# Patient Record
Sex: Male | Born: 1935 | Race: Black or African American | Hispanic: No | Marital: Married | Smoking: Former smoker
Health system: Southern US, Community
[De-identification: ages and names within clinical notes are randomized; demographics above are authoritative.]

## PROBLEM LIST (undated history)

## (undated) DIAGNOSIS — C801 Malignant (primary) neoplasm, unspecified: Secondary | ICD-10-CM

## (undated) DIAGNOSIS — J4489 Other specified chronic obstructive pulmonary disease: Secondary | ICD-10-CM

## (undated) DIAGNOSIS — N400 Enlarged prostate without lower urinary tract symptoms: Secondary | ICD-10-CM

## (undated) DIAGNOSIS — Z8619 Personal history of other infectious and parasitic diseases: Secondary | ICD-10-CM

## (undated) DIAGNOSIS — M1990 Unspecified osteoarthritis, unspecified site: Secondary | ICD-10-CM

## (undated) DIAGNOSIS — H269 Unspecified cataract: Secondary | ICD-10-CM

## (undated) DIAGNOSIS — I10 Essential (primary) hypertension: Secondary | ICD-10-CM

## (undated) DIAGNOSIS — M2550 Pain in unspecified joint: Secondary | ICD-10-CM

## (undated) DIAGNOSIS — J45909 Unspecified asthma, uncomplicated: Secondary | ICD-10-CM

## (undated) DIAGNOSIS — J42 Unspecified chronic bronchitis: Secondary | ICD-10-CM

## (undated) DIAGNOSIS — R0602 Shortness of breath: Secondary | ICD-10-CM

## (undated) DIAGNOSIS — J439 Emphysema, unspecified: Secondary | ICD-10-CM

## (undated) DIAGNOSIS — J449 Chronic obstructive pulmonary disease, unspecified: Secondary | ICD-10-CM

## (undated) DIAGNOSIS — R059 Cough, unspecified: Secondary | ICD-10-CM

## (undated) DIAGNOSIS — J189 Pneumonia, unspecified organism: Secondary | ICD-10-CM

## (undated) DIAGNOSIS — R05 Cough: Secondary | ICD-10-CM

## (undated) HISTORY — PX: NO PAST SURGERIES: SHX2092

## (undated) HISTORY — DX: Other specified chronic obstructive pulmonary disease: J44.89

## (undated) HISTORY — DX: Chronic obstructive pulmonary disease, unspecified: J44.9

---

## 2007-09-06 ENCOUNTER — Inpatient Hospital Stay (HOSPITAL_COMMUNITY): Admission: EM | Admit: 2007-09-06 | Discharge: 2007-09-10 | Payer: Self-pay | Admitting: Emergency Medicine

## 2007-09-16 ENCOUNTER — Ambulatory Visit (HOSPITAL_COMMUNITY): Admission: RE | Admit: 2007-09-16 | Discharge: 2007-09-16 | Payer: Self-pay | Admitting: *Deleted

## 2007-09-24 DIAGNOSIS — J449 Chronic obstructive pulmonary disease, unspecified: Secondary | ICD-10-CM

## 2007-09-24 DIAGNOSIS — I10 Essential (primary) hypertension: Secondary | ICD-10-CM | POA: Insufficient documentation

## 2007-09-25 ENCOUNTER — Ambulatory Visit: Payer: Self-pay | Admitting: Internal Medicine

## 2007-09-25 DIAGNOSIS — Z87891 Personal history of nicotine dependence: Secondary | ICD-10-CM

## 2007-09-30 ENCOUNTER — Ambulatory Visit: Payer: Self-pay | Admitting: Internal Medicine

## 2007-10-01 ENCOUNTER — Telehealth (INDEPENDENT_AMBULATORY_CARE_PROVIDER_SITE_OTHER): Payer: Self-pay | Admitting: *Deleted

## 2007-10-07 ENCOUNTER — Ambulatory Visit: Payer: Self-pay | Admitting: Internal Medicine

## 2007-10-22 ENCOUNTER — Encounter (HOSPITAL_COMMUNITY): Admission: RE | Admit: 2007-10-22 | Discharge: 2007-11-26 | Payer: Self-pay | Admitting: Internal Medicine

## 2007-10-22 ENCOUNTER — Encounter: Payer: Self-pay | Admitting: Internal Medicine

## 2007-11-27 ENCOUNTER — Encounter (HOSPITAL_COMMUNITY): Admission: RE | Admit: 2007-11-27 | Discharge: 2008-02-10 | Payer: Self-pay | Admitting: Internal Medicine

## 2008-01-01 ENCOUNTER — Ambulatory Visit: Payer: Self-pay | Admitting: Internal Medicine

## 2008-01-08 ENCOUNTER — Encounter: Payer: Self-pay | Admitting: Internal Medicine

## 2008-01-29 ENCOUNTER — Encounter: Payer: Self-pay | Admitting: Internal Medicine

## 2008-02-11 ENCOUNTER — Encounter (HOSPITAL_COMMUNITY): Admission: RE | Admit: 2008-02-11 | Discharge: 2008-04-27 | Payer: Self-pay | Admitting: Internal Medicine

## 2008-04-21 ENCOUNTER — Encounter: Payer: Self-pay | Admitting: Internal Medicine

## 2008-04-25 ENCOUNTER — Encounter: Payer: Self-pay | Admitting: Internal Medicine

## 2008-04-30 ENCOUNTER — Telehealth: Payer: Self-pay | Admitting: Internal Medicine

## 2008-04-30 ENCOUNTER — Encounter: Payer: Self-pay | Admitting: Internal Medicine

## 2008-06-15 ENCOUNTER — Ambulatory Visit: Payer: Self-pay | Admitting: Internal Medicine

## 2008-06-15 DIAGNOSIS — R42 Dizziness and giddiness: Secondary | ICD-10-CM

## 2008-06-16 ENCOUNTER — Telehealth (INDEPENDENT_AMBULATORY_CARE_PROVIDER_SITE_OTHER): Payer: Self-pay | Admitting: *Deleted

## 2008-06-16 ENCOUNTER — Ambulatory Visit: Payer: Self-pay | Admitting: Internal Medicine

## 2008-06-16 LAB — CONVERTED CEMR LAB
Albumin: 3.4 g/dL — ABNORMAL LOW (ref 3.5–5.2)
Calcium: 8.6 mg/dL (ref 8.4–10.5)
Creatinine, Ser: 0.9 mg/dL (ref 0.4–1.5)
Glucose, Bld: 84 mg/dL (ref 70–99)

## 2008-06-17 ENCOUNTER — Ambulatory Visit: Payer: Self-pay | Admitting: Cardiology

## 2008-06-24 ENCOUNTER — Ambulatory Visit (HOSPITAL_COMMUNITY): Admission: RE | Admit: 2008-06-24 | Discharge: 2008-06-24 | Payer: Self-pay | Admitting: Internal Medicine

## 2008-07-01 ENCOUNTER — Ambulatory Visit: Payer: Self-pay | Admitting: Internal Medicine

## 2008-07-06 ENCOUNTER — Ambulatory Visit: Payer: Self-pay | Admitting: Pulmonary Disease

## 2008-07-07 ENCOUNTER — Telehealth (INDEPENDENT_AMBULATORY_CARE_PROVIDER_SITE_OTHER): Payer: Self-pay | Admitting: *Deleted

## 2008-09-17 ENCOUNTER — Ambulatory Visit: Payer: Self-pay | Admitting: Cardiology

## 2008-10-05 ENCOUNTER — Telehealth: Payer: Self-pay | Admitting: Internal Medicine

## 2008-10-27 ENCOUNTER — Ambulatory Visit: Payer: Self-pay | Admitting: Internal Medicine

## 2008-11-04 ENCOUNTER — Telehealth (INDEPENDENT_AMBULATORY_CARE_PROVIDER_SITE_OTHER): Payer: Self-pay | Admitting: *Deleted

## 2009-02-23 ENCOUNTER — Ambulatory Visit: Payer: Self-pay | Admitting: Internal Medicine

## 2009-03-16 ENCOUNTER — Ambulatory Visit: Payer: Self-pay | Admitting: Internal Medicine

## 2009-03-16 DIAGNOSIS — R638 Other symptoms and signs concerning food and fluid intake: Secondary | ICD-10-CM

## 2009-03-17 ENCOUNTER — Encounter: Payer: Self-pay | Admitting: Internal Medicine

## 2010-02-14 ENCOUNTER — Ambulatory Visit: Payer: Self-pay | Admitting: Internal Medicine

## 2010-03-28 ENCOUNTER — Ambulatory Visit: Payer: Self-pay | Admitting: Cardiovascular Disease

## 2010-03-29 ENCOUNTER — Telehealth (INDEPENDENT_AMBULATORY_CARE_PROVIDER_SITE_OTHER): Payer: Self-pay | Admitting: *Deleted

## 2010-09-27 NOTE — Progress Notes (Signed)
Summary: 8.1.11 CT results  Phone Note Call from Patient   Caller: Patient Call For: ramaswamy Summary of Call: pt calling for results of ct scan Initial call taken by: Rickard Patience,  March 29, 2010 10:46 AM  Follow-up for Phone Call        called spoke with patient, advised of CT results as stated by MR.  pt verbalized his understanding.  Follow-up by: Boone Master CNA/MA,  March 29, 2010 10:49 AM

## 2010-09-27 NOTE — Assessment & Plan Note (Signed)
Summary: rov/jd   Visit Type:  Follow-up Copy to:  Dr. Quintin Alto [facility] Primary Provider/Referring Provider:  Urgent [facility] at [facility]  CC:  Followup on dyspnea and cough.  Pt states that he feels his breathing is doing better.  He feels that supplemental o2 helps with exertion.  He is using proventil much less.  Pt states that he was seen at UC approx 1 month ago and dxed with "touch of pna"- txed with levaquin 500 mg.  Pt states that still has prod cough with white sputum.Marland Kitchen  History of Present Illness: OV 02/14/2010: Followup Gold Stage 3 COPD with exertional hypoxemia, some weight loss July 2010, chronic stable LUL scar and 4 mm noudle in RUL#23 on CT June 2010. Last visit was in July 2010. COPD was stable at that time. Continues to have stable COPD with minimal exertional dyspnea. Unclear if he is wearning O2. Weight is stable; taking ensure. Only interim issue is that 01/22/2010 had acute worsening cough. WEnt to [facility] urgent care. HAd CXR and was told he had touch of pneumonia (I reviewd cxr and disagree) / Rx with levaquin. Now better. Currently in stable health. No wheeze. No cough. No edema. Feels well.   Preventive Screening-Counseling & Management  Alcohol-Tobacco     Smoking Status: quit     Smoke Cessation Stage: quit     Year Quit: 2009  Current Medications (verified): 1)  Proventil Hfa 108 (90 Base) Mcg/act  Aers (Albuterol Sulfate) .... 2 Puffs As Needed 2)  Ensure High Protein   Liqd (Nutritional Supplements) .... As Directed 3)  Spiriva Handihaler 18 Mcg  Caps (Tiotropium Bromide Monohydrate) .... One Puff in Handihaler Daily 4)  Advair Diskus 100-50 Mcg/dose Misc (Fluticasone-Salmeterol) .Marland Kitchen.. 1 Puff in Am and 1 Puff in Pm  Allergies (verified): No Known Drug Allergies  Past History:  Past Medical History: Reviewed history from 03/16/2009 and no changes required. #C O P D .....................................................Marland KitchenRamaswamyy > - dxed Jan 2009  following exacerbation. Confirmed on CT chest.  -> Gold Stage 3 on pft 09/2007 (post-sprivia, pre-advair) -> exertional desaturation 1 lap in office 09/2007 and 10/2008 - on exertional home o2 >desaturated to 90% afte 185 feet x 3 laps in pffice 03/16/2009  #LUL scar like opacity with associated small nodules - on serial followup..............................Marland KitchenRamaswamy -> newly dxed 06/17/2008, PET negative 06/24/2008 -> stabe on CT 09/17/2008 but has some new small nodules >CT 03/16/2009. LUL scar improved. LUL smaller nodules of Jan 2010 have resolved. Has nee 4mm RUL nodule #Hypertension - dxed 2009 for COPD admission #Tobacco Abuse - quit  Family History: Reviewed history from 09/25/2007 and no changes required. COPD: dad  Coronary Heart Disease: Mom  Social History: Reviewed history from 09/25/2007 and no changes required. Quit smoking 08/29/2007 and then had AE COPD. Retired Financial controller from Forensic scientist.  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 Never worked in Investment banker, corporate, shipyards, or Holiday representative. Lives with wife.   Review of Systems  The patient denies anorexia, fever, weight loss, weight gain, vision loss, decreased hearing, hoarseness, chest pain, syncope, dyspnea on exertion, peripheral edema, prolonged cough, headaches, hemoptysis, abdominal pain, melena, hematochezia, severe indigestion/heartburn, hematuria, incontinence, genital sores, muscle weakness, suspicious skin lesions, transient blindness, difficulty walking, depression, unusual weight change, abnormal bleeding, enlarged lymph nodes, angioedema, breast masses, and testicular masses.    Vital Signs:  Patient profile:   77 year old male Weight:  120 pounds BMI:     18.86 O2 Sat:      100 % on Room air Temp:     97.4 degrees F oral Pulse rate:   65 / minute BP sitting:   102 / 62  (left arm)  Vitals Entered By: Vernie Murders (February 14, 2010 9:05 AM)  O2 Flow:   Room air CC: Followup on dyspnea and cough.  Pt states that he feels his breathing is doing better.  He feels that supplemental o2 helps with exertion.  He is using proventil much less.  Pt states that he was seen at UC approx 1 month ago and dxed with "touch of pna"- txed with levaquin 500 mg.  Pt states that still has prod cough with white sputum.   Physical Exam  General:  thin.   HEENT - no thrush, no post nasal drip No JVD, no lymphadenopathy CVS- s1s2 nml, no murmur RS- clear, no crackles or rhonchi Abd- soft, non-tender, no organomegaly CNS- non focal Ext - no edema  Head:  normocephalic and atraumatic Eyes:  PERRLA/EOM intact; conjunctiva and sclera clear Ears:  Ears with wax both sides Nose:  no deformity, discharge, inflammation, or lesions Mouth:  Dental Caries ++Melampatti Class II.   Neck:  no masses, thyromegaly, or abnormal cervical nodes Chest Wall:  no deformities noted Lungs:  decreased BS bilateral and prolonged exhilation.   Heart:  regular rate and rhythm, S1, S2 without murmurs, rubs, gallops, or clicks Abdomen:  bowel sounds positive; abdomen soft and non-tender without masses, or organomegaly Msk:  no deformity or scoliosis noted with normal posture Pulses:  pulses normal Extremities:  no clubbing, cyanosis, edema, or deformity noted Neurologic:  CN II-XII grossly intact with normal reflexes, coordination, muscle strength and tone Skin:  intact without lesions or rashes Cervical Nodes:  no significant adenopathy Psych:  alert and cooperative; normal mood and affect; normal attention span and concentration   CXR  Procedure date:  01/22/2010  Findings:      copd chronic LUL scar no new infiltrates  Comments:      my personal review. No chnage since 2008 CXR. This is on his CD ROM  Impression & Recommendations:  Problem # 1:  WEIGHT LOSS-ABNORMAL (ICD-783.2) Assessment Unchanged  120# July 2010 120# June 2011  plan continue ensure and protein  diet  Orders: Est. Patient Level III [phone])  Problem # 2:  PULMONARY NODULE, LEFT UPPER LOBE (ICD-518.89) Assessment: Unchanged  LUL Jan 2009 -> July 2010 scar is less LUL nodules Jan 2010 -> July 2001 have resolved RUL 4mm nodule im#23 - NEW   plan repeat ct in agusut 2011 is pending will call him with result  Orders: Est. Patient Level III (60454)  Problem # 3:  C O P D (ICD-496) Assessment: Unchanged  stable  plan continue advair, spiriva cont home o2 at night and with exertion flu shot in fall check VItamin D level today rov 1 year at fu check spirometry and consider A1AT screen (dad had copd)  Other Orders: T- * Misc. Laboratory test [phone])  Patient Instructions: 1)  your copd is stable 2)  I do not think you had pneumonia on 01/22/2010 3)  Keep CD ROom of your cxr safe with you 4)  Meet with our coordinator to talk about insurance issue August Ct 5)  HAVe august CT scan chest and I will call you back with results 6)  Have VITamin D Level Checked today and I wil call  you wiht result 7)  REturn to see me in 1 year or sooner if there are problems 8)  Spirometry at followup in office

## 2011-01-10 NOTE — Discharge Summary (Signed)
NAME:  Frank Garcia, Frank Garcia                ACCOUNT NO.:  [id number]   MEDICAL RECORD NO.:  [id number]          PATIENT TYPE:  INP   LOCATION:  5503                         FACILITY:  [facility]   PHYSICIAN:  Mobolaji B. Bakare, M.D.DATE OF BIRTH:  Jan 25, 1929   DATE OF ADMISSION:  09/05/2007  DATE OF DISCHARGE:  09/10/2007                               DISCHARGE SUMMARY   PRIMARY CARE PHYSICIAN:  Unassigned.   FINAL DIAGNOSES:  1. Chronic obstructive pulmonary disease exacerbation.  2. Tobacco abuse.  3. Hypertension.   PROCEDURES:  Chest x-ray done on September 05, 2007, showed COPD, cannot  exclude occult right lower lobe nodule.  CT scan of the chest without IV  contrast showed no evidence of right lower lobe nodule initially seen on  chest x-ray.  It confirmed COPD and emphysema.   BRIEF HISTORY:  Please refer to the admission H&P for full details.  In  brief, Mr. Frank Garcia is a pleasant 77 year old African-American male who has  no regular doctor.  He does not have any known medical illness.  He is  not on any chronic medications.  He has been smoking cigarettes since  the age of 65.  At his peak, he was smoking 1-1/2 packs per day.  He  quit smoking in January of this year as a New Year's resolution.  The  patient developed upper respiratory tract infection with nasal  congestion, rhinorrhea, postnasal drip about a week prior to  presentation.  He subsequently developed cough, became wheezy and with  shortness of breath on ambulation.  He was taken to the emergency room  for evaluation.  The patient was admitted for COPD exacerbation and  started on steroids, Zithromax and scheduled nebs.   HOSPITAL COURSE:  The patient was placed on albuterol and Atrovent nebs  q.4h.,  prednisone 40 mg daily, Zithromax 200 mg daily for 5 days.  He  became more wheezy on the second day of hospitalization and the steroid  was changed to IV.  The patient made a very slow progress during the  course of the  hospitalization.  Saturation on 2 liters ranged between  95% to 100%.  He is fine at rest, but he becomes wheezy and short of  breath on ambulation.  He tells me that this problem has been going on  for over 2 years, such that he becomes dyspneic and wheezy only to  exertion, but he has not seen a doctor for this.  The patient was  saturating at 86% on room air while resting, and this improved to 96% on  2 L,  He is  comfortable with 3.5 L  to 4 L on ambulation.   Hypertension:  The patient was noted to have high blood pressure during  the course of hospitalization.  This was monitored closely.  The  decision was made to start him on antihypertensive using  hydrochlorothiazide.  Initial blood pressure ranged between 140 to 160  systolic and 70 to 95 diastolic.  He was started on hydrochlorothiazide  25 mg.  Blood pressure at the time of discharge was __________  /  68 .   The patient received a flu vaccine and Pneumovax vaccine during his  hospitalization.   DISCHARGE INSTRUCTIONS:  1. The patient is scheduled to have a pulmonary function test on      Tuesday, September 17, 2007, at 2:00 p.m.  2. He is to follow up with Dr. Marchelle Gearing on September 25, 2007, at 1:30      p.m.  3. Follow up with Dr. Merita Norton, who will assume care as primary      physician.  4. Home health RN has been arranged to follow up with the patient.  5. Check BMET in 10 days with primary care physician.   DISCHARGE MEDICATIONS:  1. DuoNebs 3 mL q.i.d. and p.r.n.  2. Albuterol inhaler 2 puffs p.r.n.  3. Prednisone 40 mg tapered by 10 mg every 4 days.  4. Nasonex nasal spray 1 each nostril daily.  5. Hydrochlorothiazide 25 mg daily.  6. Prilosec OTC 2 tablets daily for 2 weeks.  7. Potassium chloride 10 mEq daily.  8. Nicotine patch 21 mg daily.  9. Ensure 2 to 3 times daily.  10.__________  supplement.  11.Home oxygen 3 to 4 L per minute.  12.Mucinex 600 mg b.i.d.   DISCHARGE LABORATORY DATA:  Sodium 139,  potassium 4.2, chloride  __________ , BUN 15, creatinine 0.9, calcium 9.8, glucose 138 (the  patient is on steroid ).      Mobolaji B. Corky Downs, M.D.  Electronically Signed     MBB/MEDQ  D:  09/11/2007  T:  09/11/2007  Job:  [id number]   cc:   Kalman Shan, MD

## 2011-01-10 NOTE — H&P (Signed)
NAME:  Frank Garcia, Frank Garcia                ACCOUNT NO.:  [id number]   MEDICAL RECORD NO.:  [id number]          PATIENT TYPE:  EMS   LOCATION:  [facility]                         FACILITY:  [facility]   PHYSICIAN:  Mobolaji B. Bakare, M.D.DATE OF BIRTH:  23-Oct-1928   DATE OF ADMISSION:  09/05/2007  DATE OF DISCHARGE:                              HISTORY & PHYSICAL   PRIMARY CARE PHYSICIAN:  Unassigned.   CHIEF COMPLAINT:  Shortness of breath.   HISTORY OF PRESENTING COMPLAINT:  Frank Garcia is a pleasant 77 year old  African American male with no known significant past medical history.  He does not have a regular doctor.  He is not on any chronic  medications.  He was in his usual state of health until about a week  ago, when he developed upper respiratory tract infection with nasal  congestion and rhinorrhea, which later developed into postnasal drip.  The patient subsequently started having shortness of breath associated  with productive cough.  Cough is productive of whitish sputum.  He  became wheezy and developed difficulty with breathing, hence he came to  the emergency room for evaluation last night.  The patient was nebulized  here at the emergency room.  He felt somewhat better.  He was about to  be discharged and it was noted that the patient had dropped his oxygen  saturation on room air to the mid-80s and was extremely short of breath.  Hence, [facility] was called for evaluation and admission.   The patient admitted that he has been having trouble breathing on  minimal exertion for about 2 years.  He can no longer walk a block.  If  he moves from one bedroom to the other at home, he becomes short of  breath, however has been able to cope at home.  He has a significant  history of tobacco abuse, over 60 pack-years of smoking.  The patient  decided to quit smoking on New Years Day.   He denies fever, chills.  He has postnasal drip.  No chest pain or  pleuritic chest pain.   REVIEW OF  SYSTEMS:  There is no abdominal pain, nausea or vomiting,  diarrhea, diaphoresis.  He has no headaches or focal weakness.   PAST MEDICAL HISTORY:  None.   PAST SURGICAL HISTORY:  None.   MEDICATIONS:  None.   ALLERGIES:  No known drug allergies.   FAMILY HISTORY:  Mother passed away from heart failure.  Father passed  away from emphysema.   SOCIAL HISTORY:  The patient  started smoking at the age of 75.  He  smoked a maximum  of 1-1/2 packs.  He started tapering down and  currently quit smoking in December 2008, he was smoking 1-1/2 packs in 3-  4 days.  He does not drink alcohol.  He is a retired Retail buyer.   INITIAL VITALS:  Temperature 97.8, blood pressure 163/95, a repeat blood  pressure is 118/52 with a heart rate of 89, respiratory rate 18-25, O2  saturation 98% on 2 L.  On examination, the patient is mildly dyspneic, not febrile,  anicteric  nor pale.  No elevated JVD, no carotid bruit.  LUNGS:  Very minimal rhonchi at this point.  CVS:  S1, S2 regular.  No gallop or rub.  ABDOMEN:  Not distended, soft, nontender, bowel sounds positive.  EXTREMITIES:  No pedal edema or calf tenderness.  Dorsalis pedis pulses  palpable bilaterally.  CNS:  No focal neurological deficit.   INITIAL LABORATORY DATA:  Sodium 137, potassium 3.7, chloride 102,  bicarb 26, glucose 87, BUN 13, creatinine 0.88, calcium 9.1.  Total  protein 7.2, albumin 3.9, AST 33, ALT 15, alkaline phosphatase 78,  bilirubin 2.0.  White cells 7.6, hemoglobin 13.8, hematocrit 41.4,  platelets 186, neutrophils 80%.  ABG showed a pH of 7.39, pCO2 40, pCO2  94, bicarb 25, O2 saturation 97%.  Chest x-ray shows features consistent  with COPD.  EKG:  Normal sinus rhythm with a heart rate of __________ ,  normal intervals, left axis deviation, no acute ST changes.   ASSESSMENT AND PLAN:  Frank Garcia is a pleasant 77 year old African  American male with no known past medical history.  He has over 60 pack-   years of smoking.  He is presenting with shortness of breath, wheezing  and cough, no fever or leukocytosis.  This was preceded by an upper  respiratory tract infection.  He does have postnasal drip.  He has  received treatment for chronic obstructive pulmonary disease  exacerbation in the emergency room.  The patient's O2 saturation is  poor.  He will be admitted for 24-hour observation.   ADMISSION DIAGNOSES:  1. Chronic obstructive pulmonary disease exacerbation.  2. Tobacco abuse.   PLAN:  The patient will be admitted to a medical-surgical floor.  He  will be nebulized with albuterol and Atrovent q.6h. and q.2h. p.r.n.  Prednisone 40 mg daily to taper.  Zithromax 250 mg daily for 5 days.  Nasonex nasal spray one each nostril daily.  The patient will need an  outpatient pulmonary function test in 3-4 weeks when the lungs are more  settled.  Will ask for tobacco cessation counseling to offer assistance  with quitting smoking.      Mobolaji B. Corky Downs, M.D.  Electronically Signed     MBB/MEDQ  D:  09/05/2007  T:  09/05/2007  Job:  [id number]

## 2011-02-06 ENCOUNTER — Encounter: Payer: Self-pay | Admitting: Internal Medicine

## 2011-02-08 ENCOUNTER — Encounter: Payer: Self-pay | Admitting: Internal Medicine

## 2011-02-08 ENCOUNTER — Ambulatory Visit (INDEPENDENT_AMBULATORY_CARE_PROVIDER_SITE_OTHER): Payer: Medicare Other | Admitting: Internal Medicine

## 2011-02-08 VITALS — BP 116/64 | HR 60 | Temp 97.7°F | Ht 67.0 in | Wt 111.6 lb

## 2011-02-08 DIAGNOSIS — J449 Chronic obstructive pulmonary disease, unspecified: Secondary | ICD-10-CM

## 2011-02-08 DIAGNOSIS — R05 Cough: Secondary | ICD-10-CM

## 2011-02-08 DIAGNOSIS — R918 Other nonspecific abnormal finding of lung field: Secondary | ICD-10-CM

## 2011-02-08 DIAGNOSIS — R911 Solitary pulmonary nodule: Secondary | ICD-10-CM

## 2011-02-08 DIAGNOSIS — R638 Other symptoms and signs concerning food and fluid intake: Secondary | ICD-10-CM

## 2011-02-08 NOTE — Assessment & Plan Note (Signed)
Stable gold stge 3 with age related decline of 110cc in 3.25 years.  Known ex-desat. Sp pulmoonary rehab. Associated cachexia but good funcitional status with class 1-2 dyspnea  PLAN Continue mdi Continue o2 prn and qhs Check alpha 1

## 2011-02-08 NOTE — Assessment & Plan Note (Signed)
Due to sinus drainage and copd. Moderate in severity  Plan Continue copd rx mdi Advised netti pot

## 2011-02-08 NOTE — Patient Instructions (Signed)
#  COPD  -  You have had age related decline in lung function but you are still in the same severe category  -continue your inhalers  - give blood for alpha 1 genetic test (forgot to tell you about it); will call you with results in several weeks  - continue oxygen #weight loss and pulmonary nodules   - check CT cscan chest  - I will call you with results #pulmonary nodules  - repeat CT chest #cough and sinus drainge  - use netti pot, my nurse will show you a picture of it for you to buy at [facility] #Followup  - 9 months or sooner if needed

## 2011-02-08 NOTE — Assessment & Plan Note (Signed)
#  LUL scar like opacity with associated nodules   - new 06/17/2008. PET negative 06/24/2008  - CT 03/16/2009 - improved scar, LUL nodules resolved  #RUL nodule 4mm  - new on CT 03/16/2009  - stabe on CT Aug 2011  PLAN Repeat CT chest due to smoking hx, copd and current weight loss

## 2011-02-08 NOTE — Progress Notes (Signed)
  Subjective:    Patient ID: Frank Garcia, male    DOB: 08-17-1929, 77 y.o.   MRN: [id number]  HPI  FU Gold stage 3 copd (feb 2009, fev1 1.13L/46% while on spiriva and advair with ex-desat, pulmonary nodules, ex-smoker, abnromal weight loss  Wife dementia, stroke.  Now he is caregiver. Daughter moved in with him. Having cough with sinus drainage. Lost 9# wt since last summer; now 111#. No appetite. Otherwise stable health. STable dyspnea. STable moderate cough but is bothering him; admits to chronic sinus drainage. Uses o2 prn. Spirometry today (reviewed) - fev1 0.95L/38% and is a 110cc loss in fev1 in 3 years which approx 35cc/year  Review of Systems  Constitutional: Negative for fever and unexpected weight change.  HENT: Negative for ear pain, nosebleeds, congestion, sore throat, rhinorrhea, sneezing, trouble swallowing, dental problem, postnasal drip and sinus pressure.   Eyes: Negative for redness and itching.  Respiratory: Positive for cough. Negative for chest tightness, shortness of breath and wheezing.   Cardiovascular: Negative for palpitations and leg swelling.  Gastrointestinal: Negative for nausea and vomiting.  Genitourinary: Negative for dysuria.  Musculoskeletal: Negative for joint swelling.  Skin: Negative for rash.  Neurological: Negative for headaches.  Hematological: Does not bruise/bleed easily.  Psychiatric/Behavioral: Negative for dysphoric mood. The patient is not nervous/anxious.        Objective:   Physical Exam  Nursing note and vitals reviewed. Constitutional: He is oriented to person, place, and time. He appears well-developed and well-nourished. No distress.       Lean as before but probably leaner  HENT:  Head: Normocephalic and atraumatic.  Right Ear: External ear normal.  Left Ear: External ear normal.  Mouth/Throat: Oropharynx is clear and moist. No oropharyngeal exudate.  Eyes: Conjunctivae and EOM are normal. Pupils are equal, round, and reactive  to light. Right eye exhibits no discharge. Left eye exhibits no discharge. No scleral icterus.  Neck: Normal range of motion. Neck supple. No JVD present. No tracheal deviation present. No thyromegaly present.  Cardiovascular: Normal rate, regular rhythm and intact distal pulses.  Exam reveals no gallop and no friction rub.   No murmur heard. Pulmonary/Chest: Effort normal and breath sounds normal. No respiratory distress. He has no wheezes. He has no rales. He exhibits no tenderness.  Abdominal: Soft. Bowel sounds are normal. He exhibits no distension and no mass. There is no tenderness. There is no rebound and no guarding.  Musculoskeletal: Normal range of motion. He exhibits no edema and no tenderness.  Lymphadenopathy:    He has no cervical adenopathy.  Neurological: He is alert and oriented to person, place, and time. He has normal reflexes. No cranial nerve deficit. Coordination normal.  Skin: Skin is warm and dry. No rash noted. He is not diaphoretic. No erythema. No pallor.  Psychiatric: He has a normal mood and affect. His behavior is normal. Judgment and thought content normal.          Assessment & Plan:

## 2011-02-08 NOTE — Assessment & Plan Note (Signed)
120# - June 2011 111# - June 2012 - unintentional 9# wt loss  Plan Ct chest esp due to smoking hx, copd and prior pulmonary nodules Woill call him with result If CT chest negative, PMD to address wt loss

## 2011-02-10 ENCOUNTER — Ambulatory Visit (INDEPENDENT_AMBULATORY_CARE_PROVIDER_SITE_OTHER)
Admission: RE | Admit: 2011-02-10 | Discharge: 2011-02-10 | Disposition: A | Discharge status: Home / Self Care | Payer: Medicare Other | Source: Ambulatory Visit | Attending: Internal Medicine | Admitting: Internal Medicine

## 2011-02-10 DIAGNOSIS — R911 Solitary pulmonary nodule: Secondary | ICD-10-CM

## 2011-02-10 DIAGNOSIS — J984 Other disorders of lung: Secondary | ICD-10-CM

## 2011-03-06 ENCOUNTER — Encounter: Payer: Self-pay | Admitting: Internal Medicine

## 2011-05-18 LAB — COMPREHENSIVE METABOLIC PANEL
AST: 30
Albumin: 3.2 — ABNORMAL LOW
BUN: 16
CO2: 28
Calcium: 9
Chloride: 102
Chloride: 106
Creatinine, Ser: 0.88
Creatinine, Ser: 0.89
GFR calc Af Amer: >60
GFR calc Af Amer: >60
GFR calc non Af Amer: >60
GFR calc non Af Amer: >60
Glucose, Bld: 114 — ABNORMAL HIGH
Glucose, Bld: 87
Potassium: 4
Sodium: 137
Sodium: 140
Total Bilirubin: 1.5 — ABNORMAL HIGH
Total Bilirubin: 2 — ABNORMAL HIGH
Total Protein: 6
Total Protein: 7.2

## 2011-05-18 LAB — LIPID PANEL
Cholesterol: 158
Total CHOL/HDL Ratio: 2

## 2011-05-18 LAB — URINE MICROSCOPIC-ADD ON

## 2011-05-18 LAB — CBC
Hemoglobin: 13.8
MCHC: 33.4
MCV: 82.8
Platelets: 186
RDW: 15
WBC: 7.9

## 2011-05-18 LAB — BASIC METABOLIC PANEL
CO2: 33 — ABNORMAL HIGH
Calcium: 9.8
GFR calc Af Amer: >60
GFR calc non Af Amer: >60
Sodium: 139

## 2011-05-18 LAB — DIFFERENTIAL
Basophils Absolute: 0
Eosinophils Relative: 0
Lymphocytes Relative: 13

## 2011-05-18 LAB — URINALYSIS, ROUTINE W REFLEX MICROSCOPIC
Bilirubin Urine: NEGATIVE
Glucose, UA: NEGATIVE
Ketones, ur: 15 — AB
Leukocytes, UA: NEGATIVE
Nitrite: NEGATIVE
Specific Gravity, Urine: 1.024
Urobilinogen, UA: 1
pH: 5.5

## 2011-05-18 LAB — POCT I-STAT 3, ART BLOOD GAS (G3+)
Bicarbonate: 24.8 — ABNORMAL HIGH
Operator id: 135691
pCO2 arterial: 40.4

## 2011-05-25 ENCOUNTER — Other Ambulatory Visit: Payer: Self-pay | Admitting: Internal Medicine

## 2011-05-26 ENCOUNTER — Telehealth: Payer: Self-pay | Admitting: Internal Medicine

## 2011-05-26 MED ORDER — FLUTICASONE-SALMETEROL 100-50 MCG/DOSE IN AEPB: 1.0000 | INHALATION_SPRAY | Freq: Two times a day (BID) | RESPIRATORY_TRACT | Status: DC

## 2011-05-26 MED ORDER — TIOTROPIUM BROMIDE MONOHYDRATE 18 MCG IN CAPS: 18.0000 ug | ORAL_CAPSULE | Freq: Every day | RESPIRATORY_TRACT | Status: DC

## 2011-05-26 NOTE — Telephone Encounter (Signed)
Spoke with pt and he states needs 90 day supply of advair and spiriva sent to [facility]. Pt stated nothing further needed at this time.

## 2011-05-30 ENCOUNTER — Other Ambulatory Visit: Payer: Self-pay | Admitting: *Deleted

## 2011-05-30 MED ORDER — FLUTICASONE-SALMETEROL 100-50 MCG/DOSE IN AEPB: 1.0000 | INHALATION_SPRAY | Freq: Two times a day (BID) | RESPIRATORY_TRACT | Status: DC

## 2011-11-13 ENCOUNTER — Encounter: Payer: Self-pay | Admitting: Internal Medicine

## 2011-11-13 ENCOUNTER — Ambulatory Visit (INDEPENDENT_AMBULATORY_CARE_PROVIDER_SITE_OTHER): Payer: Medicare Other | Admitting: Internal Medicine

## 2011-11-13 DIAGNOSIS — B029 Zoster without complications: Secondary | ICD-10-CM

## 2011-11-13 DIAGNOSIS — J449 Chronic obstructive pulmonary disease, unspecified: Secondary | ICD-10-CM

## 2011-11-13 DIAGNOSIS — J441 Chronic obstructive pulmonary disease with (acute) exacerbation: Secondary | ICD-10-CM | POA: Insufficient documentation

## 2011-11-13 DIAGNOSIS — R638 Other symptoms and signs concerning food and fluid intake: Secondary | ICD-10-CM

## 2011-11-13 MED ORDER — ACYCLOVIR 400 MG PO TABS: 800.0000 mg | ORAL_TABLET | Freq: Every day | ORAL | Status: AC

## 2011-11-13 MED ORDER — DOXYCYCLINE HYCLATE 100 MG PO TABS: 100.0000 mg | ORAL_TABLET | Freq: Two times a day (BID) | ORAL | Status: DC

## 2011-11-13 MED ORDER — PREDNISONE 10 MG PO TABS: ORAL_TABLET | ORAL | Status: DC

## 2011-11-13 NOTE — Patient Instructions (Signed)
#  COPD  - You have mild attack of copd called COPD exacerbation Please take doxycycline 100mg  twice daily after meals x 5 days; avoid sunlight Please take prednisone 40mg  once daily x 3 days, then 20mg  once daily x 3 days, then 10mg  once daily x 3 days, then 5mg  once dailyx 3 days and stop #shingles  - you likely have shingles in the left hip and left buttock area; this explains the pain  - take acyclovir 800mg  five times daily x 7 days (watch out for side effects explained to you by pharmacy)  - because you have had symptoms for a week the pain might not improve or you could have secondary bacterial infection; watch out for this  #Followup - return 7 days with NP Tammy for review and med calendar

## 2011-11-13 NOTE — Assessment & Plan Note (Signed)
improvoing  Plan monitor

## 2011-11-13 NOTE — Assessment & Plan Note (Signed)
Gold stage 3-4. Currently in AECOPD.  Plan See AECOPD section

## 2011-11-13 NOTE — Assessment & Plan Note (Signed)
shingles  - you likely have shingles in the left hip and left buttock area; this explains the pain  - take acyclovir 800mg  five times daily x 7 days (watch out for side effects explained to you by pharmacy)  - because you have had symptoms for a week the pain might not improve or you could have secondary bacterial infection; watch out for this  #Followup - return 7 days with NP Tammy for review and med calendar

## 2011-11-13 NOTE — Assessment & Plan Note (Signed)
You have mild attack of copd called COPD exacerbation -Please take doxycycline 100mg twice daily after meals x 5 days; avoid sunlight -Please take prednisone 40mg once daily x 3 days, then 20mg once daily x 3 days, then 10mg once daily x 3 days, then 5mg once dailyx 3 days and stop   You have mild attack of copd called COPD exacerbation Please take doxycycline 100mg  twice daily after meals x 5 days; avoid sunlight Please take prednisone 40mg  once daily x 3 days, then 20mg  once daily x 3 days, then 10mg  once daily x 3 days, then 5mg  once dailyx 3 days and stop #

## 2011-11-13 NOTE — Progress Notes (Signed)
Subjective:    Patient ID: Frank Garcia, male    DOB: 1929-02-27, 78 y.o.   MRN: [id number]  HPI  #FU Gold stage 3 copd - MM genotype  - feb 2009, fev1 1.13L/46% while on spiriva and advair with ex-desat, - June 2012:  Spirometry today (reviewed) - fev1 0.95L/38% and is a 110cc loss in fev1 in 3 years which approx 35cc/year # pulmonary nodules, - RUL  - June 2012 - stability over  Years. No further fu # ex-smoker,  #abnromal weight loss  - Body mass index is 18.17 kg/(m^2). on 11/13/2011 (weight 116# and up 5# since June 2012)    OV 11/13/2011 Followup COPD, weight loss. : Lab result reviewed: Alpha 1 is MM from June 2012. OVerall stable. Gained 5# weight. Daughter with him today. Says mostly sedentary. Does some work around house (folding clothes, vacuum) and does it well at his own pace without dyspnea. Past 1 month, increased cough, dyspnea, congestion, sputum is thick and tough to get out but when it comes out is dark in color. Feels symptoms are worse in bad weather days like today (40s and cold and rainy). Mild-moderate in intenseity. In additiion new complaint of pain around left hip x 7 days, mild, constant, dull, no radiation, no aggravating or relieving factors. Exams shows zoster rash - noted only today  Past, Family, Social reviewed: no change since last visit. Wife still alive and he is caretaker         Review of Systems  Constitutional: Negative for fever and unexpected weight change.  HENT: Negative for ear pain, nosebleeds, congestion, sore throat, rhinorrhea, sneezing, trouble swallowing, dental problem, postnasal drip and sinus pressure.   Eyes: Negative for redness and itching.  Respiratory: Negative for cough, chest tightness, shortness of breath and wheezing.   Cardiovascular: Negative for palpitations and leg swelling.  Gastrointestinal: Negative for nausea and vomiting.  Genitourinary: Negative for dysuria.  Musculoskeletal: Negative for joint swelling.    Skin: Negative for rash.  Neurological: Negative for headaches.  Hematological: Does not bruise/bleed easily.  Psychiatric/Behavioral: Negative for dysphoric mood. The patient is not nervous/anxious.        Objective:   Physical Exam  Nursing note and vitals reviewed. Constitutional: He is oriented to person, place, and time. He appears well-developed and well-nourished. No distress.       Body mass index is 18.17 kg/(m^2).   HENT:  Head: Normocephalic and atraumatic.  Right Ear: External ear normal.  Left Ear: External ear normal.  Mouth/Throat: Oropharynx is clear and moist. No oropharyngeal exudate.  Eyes: Conjunctivae and EOM are normal. Pupils are equal, round, and reactive to light. Right eye exhibits no discharge. Left eye exhibits no discharge. No scleral icterus.  Neck: Normal range of motion. Neck supple. No JVD present. No tracheal deviation present. No thyromegaly present.  Cardiovascular: Normal rate, regular rhythm and intact distal pulses.  Exam reveals no gallop and no friction rub.   No murmur heard. Pulmonary/Chest: Effort normal and breath sounds normal. No respiratory distress. He has no wheezes. He has no rales. He exhibits no tenderness.  Abdominal: Soft. Bowel sounds are normal. He exhibits no distension and no mass. There is no tenderness. There is no rebound and no guarding.  Musculoskeletal: Normal range of motion. He exhibits no edema and no tenderness.  Lymphadenopathy:    He has no cervical adenopathy.  Neurological: He is alert and oriented to person, place, and time. He has normal reflexes. No cranial nerve  deficit. Coordination normal.  Skin: Skin is warm and dry. Rash noted. He is not diaphoretic. No erythema. No pallor.       Zoster like rash in left gluteal area and left hip in dermatoome patter. Dtr took picture with iPHONE. Spotted by patient only today  Psychiatric: He has a normal mood and affect. His behavior is normal. Judgment and thought  content normal.   Nursing note and vitals reviewed. Constitutional: He is oriented to person, place, and time. He appears well-developed and well-nourished. No distress.       Lean as before but probably leaner  HENT:  Head: Normocephalic and atraumatic.  Right Ear: External ear normal.  Left Ear: External ear normal.  Mouth/Throat: Oropharynx is clear and moist. No oropharyngeal exudate.  Eyes: Conjunctivae and EOM are normal. Pupils are equal, round, and reactive to light. Right eye exhibits no discharge. Left eye exhibits no discharge. No scleral icterus.  Neck: Normal range of motion. Neck supple. No JVD present. No tracheal deviation present. No thyromegaly present.  Cardiovascular: Normal rate, regular rhythm and intact distal pulses.  Exam reveals no gallop and no friction rub.   No murmur heard. Pulmonary/Chest: Effort normal and breath sounds normal. No respiratory distress. He has no wheezes. He has no rales. He exhibits no tenderness.  Abdominal: Soft. Bowel sounds are normal. He exhibits no distension and no mass. There is no tenderness. There is no rebound and no guarding.  Musculoskeletal: Normal range of motion. He exhibits no edema and no tenderness.  Lymphadenopathy:    He has no cervical adenopathy.  Neurological: He is alert and oriented to person, place, and time. He has normal reflexes. No cranial nerve deficit. Coordination normal.  Skin: Skin is warm and dry. No rash noted. He is not diaphoretic. No erythema. No pallor.  Psychiatric: He has a normal mood and affect. His behavior is normal. Judgment and thought content normal.         Assessment & Plan:

## 2011-11-20 ENCOUNTER — Ambulatory Visit (INDEPENDENT_AMBULATORY_CARE_PROVIDER_SITE_OTHER): Payer: Medicare Other | Admitting: Adult Health

## 2011-11-20 ENCOUNTER — Encounter: Payer: Self-pay | Admitting: Adult Health

## 2011-11-20 VITALS — BP 132/72 | HR 68 | Temp 96.6°F | Ht 67.5 in | Wt 116.4 lb

## 2011-11-20 DIAGNOSIS — J441 Chronic obstructive pulmonary disease with (acute) exacerbation: Secondary | ICD-10-CM

## 2011-11-20 DIAGNOSIS — B029 Zoster without complications: Secondary | ICD-10-CM

## 2011-11-20 MED ORDER — TRAMADOL HCL 50 MG PO TABS: 50.0000 mg | ORAL_TABLET | Freq: Three times a day (TID) | ORAL | Status: DC | PRN

## 2011-11-20 NOTE — Assessment & Plan Note (Signed)
Recent outbreak- resolving  Does have some residual pain , will have him finish acyclovir and prednisone  May use tylenol and tramadol for pain control  If not resolved w/ residual PHN - will need follow up with PCP to consider Lyrica.

## 2011-11-20 NOTE — Patient Instructions (Signed)
Finish Acyclovir and prednisone as directed.  May get Capsacin cream -apply to left hip/back area As needed  For pain.  May use Tylenol As needed  Pain .  May use Tramadol 50mg  every 8 hrs as needed for pain. -may make you sleepy.  Continue on Advair and Spiriva .  follow up Dr. Marchelle Gearing in 3 months and As needed   If pain from shingles does not resolve will need to see your family doctor.

## 2011-11-20 NOTE — Progress Notes (Signed)
Subjective:    Patient ID: Frank Garcia, male    DOB: Jul 10, 1929, 78 y.o.   MRN: [id number]  HPI  #FU Gold stage 3 copd - MM genotype  - feb 2009, fev1 1.13L/46% while on spiriva and advair with ex-desat, - June 2012:  Spirometry today (reviewed) - fev1 0.95L/38% and is a 110cc loss in fev1 in 3 years which approx 35cc/year # pulmonary nodules, - RUL  - June 2012 - stability over  Years. No further fu # ex-smoker,  #abnromal weight loss  - Body mass index is 18.17 kg/(m^2). on 11/13/2011 (weight 116# and up 5# since June 2012)    OV 11/13/2011 Followup COPD, weight loss. : Lab result reviewed: Alpha 1 is MM from June 2012. OVerall stable. Gained 5# weight. Daughter with him today. Says mostly sedentary. Does some work around house (folding clothes, vacuum) and does it well at his own pace without dyspnea. Past 1 month, increased cough, dyspnea, congestion, sputum is thick and tough to get out but when it comes out is dark in color. Feels symptoms are worse in bad weather days like today (40s and cold and rainy). Mild-moderate in intenseity. In additiion new complaint of pain around left hip x 7 days, mild, constant, dull, no radiation, no aggravating or relieving factors. Exams shows zoster rash - noted only today    11/20/2011 follow up  Pt returns for follow up . Patient was seen in the office. Last week for a COPD exacerbation. Also, noted to have acute herpes zoster flare. He was treated with doxycycline and prednisone taper and Acyclovir . He returns today with decreased cough and congestion. Says that his rash is drying out. However , still has pain, along the hip area. Reviewed all his medication list.  and they appear to be correct. Unfortunately, he did not bring his medications in today. He denies any chest pain, hemoptysis, abdominal pain, nausea, vomiting, or leg swelling.         Review of Systems  Constitutional:   No  weight loss, night sweats,  Fevers, chills, fatigue,  or  lassitude.  HEENT:   No headaches,  Difficulty swallowing,  Tooth/dental problems, or  Sore throat,                No sneezing, itching, ear ache, nasal congestion, post nasal drip,   CV:  No chest pain,  Orthopnea, PND, swelling in lower extremities, anasarca, dizziness, palpitations, syncope.   GI  No heartburn, indigestion, abdominal pain, nausea, vomiting, diarrhea, change in bowel habits, loss of appetite, bloody stools.   Resp:   No excess mucus, no productive cough,  No non-productive cough,  No coughing up of blood.  No change in color of mucus.  No wheezing.  No chest wall deformity  Skin: no rash or lesions.  GU: no dysuria, change in color of urine, no urgency or frequency.  No flank pain, no hematuria   MS:  No joint pain or swelling.  No decreased range of motion.  No back pain.  Psych:  No change in mood or affect. No depression or anxiety.  No memory loss.         Objective:   GEN: A/Ox3; pleasant , NAD, thin frail   HEENT:  [place]/AT,  EACs-clear, TMs-wnl, NOSE-clear, THROAT-clear, no lesions, no postnasal drip or exudate noted.   NECK:  Supple w/ fair ROM; no JVD; normal carotid impulses w/o bruits; no thyromegaly or nodules palpated; no lymphadenopathy.  RESP  Coarse BS , diminished bS in bases no accessory muscle use, no dullness to percussion  CARD:  RRR, no m/r/g  , no peripheral edema, pulses intact, no cyanosis or clubbing.  GI:   Soft & nt; nml bowel sounds; no organomegaly or masses detected.  Musco: Warm bil, no deformities or joint swelling noted.   Neuro: alert, no focal deficits noted.    Skin: Warm, along left hip area dried crusted rash.          Assessment & Plan:

## 2011-11-20 NOTE — Assessment & Plan Note (Signed)
Resolving flare  Finish prednisone taper  Cont on Advair and Spiriva

## 2011-11-27 ENCOUNTER — Other Ambulatory Visit: Payer: Self-pay

## 2011-11-27 ENCOUNTER — Encounter (HOSPITAL_COMMUNITY): Payer: Self-pay | Admitting: Emergency Medicine

## 2011-11-27 ENCOUNTER — Inpatient Hospital Stay (HOSPITAL_COMMUNITY)
Admission: EM | Admit: 2011-11-27 | Discharge: 2011-11-28 | DRG: 312 | Disposition: A | Discharge status: Home / Self Care | Payer: Medicare Other | Source: Ambulatory Visit | Attending: Internal Medicine | Admitting: Internal Medicine

## 2011-11-27 ENCOUNTER — Emergency Department (HOSPITAL_COMMUNITY): Payer: Medicare Other

## 2011-11-27 DIAGNOSIS — R911 Solitary pulmonary nodule: Secondary | ICD-10-CM | POA: Diagnosis present

## 2011-11-27 DIAGNOSIS — B029 Zoster without complications: Secondary | ICD-10-CM | POA: Diagnosis present

## 2011-11-27 DIAGNOSIS — R55 Syncope and collapse: Secondary | ICD-10-CM | POA: Diagnosis present

## 2011-11-27 DIAGNOSIS — N400 Enlarged prostate without lower urinary tract symptoms: Secondary | ICD-10-CM | POA: Diagnosis present

## 2011-11-27 DIAGNOSIS — B0229 Other postherpetic nervous system involvement: Secondary | ICD-10-CM | POA: Diagnosis present

## 2011-11-27 DIAGNOSIS — R350 Frequency of micturition: Secondary | ICD-10-CM | POA: Diagnosis present

## 2011-11-27 DIAGNOSIS — I2510 Atherosclerotic heart disease of native coronary artery without angina pectoris: Secondary | ICD-10-CM | POA: Diagnosis present

## 2011-11-27 DIAGNOSIS — J449 Chronic obstructive pulmonary disease, unspecified: Secondary | ICD-10-CM | POA: Diagnosis present

## 2011-11-27 DIAGNOSIS — I951 Orthostatic hypotension: Principal | ICD-10-CM | POA: Diagnosis present

## 2011-11-27 DIAGNOSIS — R3911 Hesitancy of micturition: Secondary | ICD-10-CM | POA: Diagnosis present

## 2011-11-27 DIAGNOSIS — I10 Essential (primary) hypertension: Secondary | ICD-10-CM | POA: Diagnosis present

## 2011-11-27 DIAGNOSIS — Z79899 Other long term (current) drug therapy: Secondary | ICD-10-CM

## 2011-11-27 DIAGNOSIS — J4489 Other specified chronic obstructive pulmonary disease: Secondary | ICD-10-CM | POA: Diagnosis present

## 2011-11-27 DIAGNOSIS — F17200 Nicotine dependence, unspecified, uncomplicated: Secondary | ICD-10-CM | POA: Diagnosis present

## 2011-11-27 HISTORY — DX: Benign prostatic hyperplasia without lower urinary tract symptoms: N40.0

## 2011-11-27 LAB — BASIC METABOLIC PANEL
CO2: 26 mEq/L (ref 19–32)
Glucose, Bld: 92 mg/dL (ref 70–99)
Potassium: 4 mEq/L (ref 3.5–5.1)
Sodium: 137 mEq/L (ref 135–145)

## 2011-11-27 LAB — CARDIAC PANEL(CRET KIN+CKTOT+MB+TROPI)
CK, MB: 2.5 ng/mL (ref 0.3–4.0)
CK, MB: 3 ng/mL (ref 0.3–4.0)
Relative Index: INVALID (ref 0.0–2.5)
Troponin I: <0.3 ng/mL (ref <0.30)
Troponin I: <0.3 ng/mL (ref <0.30)
Troponin I: <0.3 ng/mL (ref <0.30)

## 2011-11-27 LAB — CBC
Platelets: 161 10*3/uL (ref 150–400)
RBC: 4.57 MIL/uL (ref 4.22–5.81)
WBC: 7 10*3/uL (ref 4.0–10.5)

## 2011-11-27 LAB — DIFFERENTIAL
Lymphocytes Relative: 22 % (ref 12–46)
Lymphs Abs: 1.6 10*3/uL (ref 0.7–4.0)
Neutro Abs: 4.7 10*3/uL (ref 1.7–7.7)
Neutrophils Relative %: 68 % (ref 43–77)

## 2011-11-27 MED ORDER — ENOXAPARIN SODIUM 40 MG/0.4ML ~~LOC~~ SOLN
40.0000 mg | SUBCUTANEOUS | Status: DC
  Administered 2011-11-27: 40 mg via SUBCUTANEOUS
  Filled 2011-11-27 (×2): qty 0.4

## 2011-11-27 MED ORDER — TIOTROPIUM BROMIDE MONOHYDRATE 18 MCG IN CAPS
18.0000 ug | ORAL_CAPSULE | Freq: Every day | RESPIRATORY_TRACT | Status: DC
  Administered 2011-11-28: 18 ug via RESPIRATORY_TRACT
  Filled 2011-11-27: qty 5

## 2011-11-27 MED ORDER — SODIUM CHLORIDE 0.9 % IV BOLUS (SEPSIS)
500.0000 mL | Freq: Once | INTRAVENOUS | Status: AC
  Administered 2011-11-27: 500 mL via INTRAVENOUS

## 2011-11-27 MED ORDER — SODIUM CHLORIDE 0.9 % IV SOLN
INTRAVENOUS | Status: AC
  Administered 2011-11-27: 13:00:00 via INTRAVENOUS

## 2011-11-27 MED ORDER — BIOTENE DRY MOUTH MT LIQD: 15.0000 mL | Freq: Two times a day (BID) | OROMUCOSAL | Status: DC

## 2011-11-27 MED ORDER — ALBUTEROL SULFATE HFA 108 (90 BASE) MCG/ACT IN AERS
2.0000 | INHALATION_SPRAY | Freq: Four times a day (QID) | RESPIRATORY_TRACT | Status: DC | PRN
  Filled 2011-11-27: qty 6.7

## 2011-11-27 MED ORDER — FLUTICASONE-SALMETEROL 100-50 MCG/DOSE IN AEPB
1.0000 | INHALATION_SPRAY | Freq: Two times a day (BID) | RESPIRATORY_TRACT | Status: DC
  Administered 2011-11-27 – 2011-11-28 (×2): 1 via RESPIRATORY_TRACT
  Filled 2011-11-27: qty 14

## 2011-11-27 MED ORDER — TRAMADOL HCL 50 MG PO TABS
50.0000 mg | ORAL_TABLET | Freq: Three times a day (TID) | ORAL | Status: DC | PRN
  Administered 2011-11-27 – 2011-11-28 (×2): 50 mg via ORAL
  Filled 2011-11-27 (×2): qty 1

## 2011-11-27 MED ORDER — SODIUM CHLORIDE 0.9 % IJ SOLN
3.0000 mL | Freq: Two times a day (BID) | INTRAMUSCULAR | Status: DC
  Administered 2011-11-27: 3 mL via INTRAVENOUS

## 2011-11-27 MED ORDER — BIOTENE DRY MOUTH MT LIQD: 15.0000 mL | OROMUCOSAL | Status: DC | PRN

## 2011-11-27 NOTE — Progress Notes (Signed)
  Echocardiogram 2D Echocardiogram has been performed.  Brynlei Klausner L 11/27/2011, 3:56 PM

## 2011-11-27 NOTE — H&P (Signed)
Hospital Admission Note Date: 11/27/2011  Patient name:  Frank Garcia  Medical record number:  [id number] Date of birth:  [birth year]  Age: 78 y.o. Gender: male PCP:    No primary provider on file.  Medical Service:   Internal Medicine Teaching Service   Attending physician:  Dr. Aundria Rud First Contact:   Dr. Manson Passey    Pager: [phone]  Second Contact:   Dr. Tonny Branch   Pager: [phone] After Hours:    First Contact   Pager: [phone]      Second Contact  Pager: [phone]   Chief Complaint: Syncope  History of Present Illness: Patient is a 78 y.o. male with a PMHx of COPD, who presents to [facility] for evaluation of dizziness and passing out morning of admission. Patient lives with his daughter who is present at bedside. Patient reports he was fixing himself breakfast consisting of toast and bacon, when he suddenly felt dizzy. His daughter who was in the kitchen noticed he was about to fall and thus grabbed a chair and placed it beneath him. According to his daughter he was out for approximately 30-45 seconds. The daughter proceeded to call EMS. No tongue biting or limb jerking was reported and patient has no known seizure history. He denies weakness, numbness in any of his extremities. At baseline he is independent in all ALDs and IADLs. He reports feeling somewhat short of breath just prior to episode and went to get his oxygen, but unable to do so as he fell into the chair. The patient reports he is only on home oxygen at night and that during the day he does not need it. He was recently evaluated for BPH and started on flomax on Saturday  Night. He received two doses. Additionally patient has been suffering from post-herpetic neuralgia and has been taking tramadol for pain relief. He had very little solid and liquid intake over the weekend due to pain from shingles. He denies chest pain, cough, headache, sudden weight loss, numbness, weakness, nausea, vomiting, and changes to bowel and urinary habits, other than  frequency due to BPH.  Current Outpatient Medications: Current Facility-Administered Medications  Medication Dose Route Frequency Provider Last Rate Last Dose  . 0.9 %  sodium chloride infusion   Intravenous Continuous Gaynor Genco, MD 100 mL/hr at 11/27/11 1305    . albuterol (PROVENTIL HFA;VENTOLIN HFA) 108 (90 BASE) MCG/ACT inhaler 2 puff  2 puff Inhalation Q6H PRN Kya Mayfield, MD      . enoxaparin (LOVENOX) injection 40 mg  40 mg Subcutaneous Q24H Clevester Helzer, MD   40 mg at 11/27/11 1304  . Fluticasone-Salmeterol (ADVAIR) 100-50 MCG/DOSE inhaler 1 puff  1 puff Inhalation BID Izyan Ezzell, MD      . sodium chloride 0.9 % bolus 500 mL  500 mL Intravenous Once Nat Christen, MD   500 mL at 11/27/11 0832  . sodium chloride 0.9 % injection 3 mL  3 mL Intravenous Q12H Verina Galeno, MD   3 mL at 11/27/11 1305  . tiotropium (SPIRIVA) inhalation capsule 18 mcg  18 mcg Inhalation Daily Keanen Dohse, MD      . traMADol (ULTRAM) tablet 50 mg  50 mg Oral Q8H PRN Melida Quitter, MD   50 mg at 11/27/11 1058    Allergies: No Known Allergies   Past Medical History: Past Medical History  Diagnosis Date  . Chronic airway obstruction, not elsewhere classified     on home O2 at night  . BPH (benign prostatic hypertrophy)   .  Tobacco abuse   . Weight loss     Past Surgical History: Past Surgical History  Procedure Date  . No past surgeries     Family History: Family History  Problem Relation Age of Onset  . COPD Father     Social History: History   Social History  . Marital Status: Married    Spouse Name: N/A    Number of Children: N/A  . Years of Education: 12   Occupational History  . retired     Research officer, political party   Social History Main Topics  . Smoking status: Former Smoker -- 1.5 packs/day for 40 years    Types: Cigarettes    Quit date: 08/29/2007  . Smokeless tobacco: Not on file  . Alcohol Use: No  . Drug Use: No  . Sexually Active: No   Other Topics Concern    . Not on file   Social History Narrative   Independent in ADLs and IADLsWalks without assistance    Review of Systems: Constitutional:  Denies fever, chills, diaphoresis, appetite change and fatigue.  HEENT: Denies photophobia, eye pain, redness, hearing loss, ear pain, congestion, sore throat, rhinorrhea, sneezing, mouth sores, trouble swallowing, neck pain, neck stiffness and tinnitus.  Respiratory: Denies SOB, DOE, cough, chest tightness, and wheezing.  Cardiovascular: Denies chest pain, palpitations and leg swelling.  Gastrointestinal: Denies nausea, vomiting, abdominal pain, diarrhea, constipation, blood in stool and abdominal distention.  Genitourinary: Denies dysuria, urgency, frequency, hematuria, flank pain and difficulty urinating.  Musculoskeletal: Denies myalgias, back pain, joint swelling, arthralgias and gait problem.   Skin: Denies pallor, rash and wound.  Neurological: Denies dizziness, seizures, syncope, weakness, light-headedness, numbness and headaches.   Hematological: Denies adenopathy. Easy bruising, personal or family bleeding history.  Psychiatric/ Behavioral: Denies suicidal ideation, mood changes, confusion, nervousness, sleep disturbance and agitation.    Vital Signs: Wt Readings from Last 3 Encounters:  11/27/11 116 lb (52.617 kg)  11/20/11 116 lb 6.4 oz (52.799 kg)  11/13/11 116 lb (52.617 kg)   Temp Readings from Last 3 Encounters:  11/27/11 97.8 F (36.6 C)   11/20/11 96.6 F (35.9 C) Oral  11/13/11 98 F (36.7 C) Oral   BP Readings from Last 3 Encounters:  11/27/11 122/72  11/20/11 132/72  11/13/11 110/70   Pulse Readings from Last 3 Encounters:  11/27/11 64  11/20/11 68  11/13/11 86    Physical Exam: General: Vital signs reviewed and noted. Thin appearing man, looks stated age, in no acute distress; alert, appropriate and cooperative throughout examination.  Head: Normocephalic, atraumatic.  Eyes: PERRL, EOMI, No signs of anemia or  jaundince.  Nose: Mucous membranes slightly dry  Neck: No deformities, masses, or tenderness noted.Supple, No carotid Bruits  Lungs:  Normal respiratory effort. Clear to auscultation BL without crackles or wheezes.  Heart: RRR. S1 and S2 normal without gallop, murmur, or rubs.  Abdomen:  BS normoactive. Soft, Nondistended, non-tender.  No masses or organomegaly.  Extremities: No pretibial edema.  Neurologic: A&O X3, CN II - XII are grossly intact. Motor strength is 5/5 in the all 4 extremities, Sensations intact to light touch, Cerebellar signs negative.  Skin: No visible rashes, scars.   Lab results: Basic Metabolic Panel: Recent Labs  [facility] 11/27/11 0828   NA 137   K 4.0   CL 103   CO2 26   GLUCOSE 92   BUN 18   CREATININE 1.07   CALCIUM 8.8   MG --   PHOS --   CBC:  Recent Labs  [facility] 11/27/11 0828   WBC 7.0   NEUTROABS 4.7   HGB 12.7*   HCT 38.1*   MCV 83.4   PLT 161   Cardiac Enzymes: Recent Labs  [facility] 11/27/11 1150 11/27/11 0828   CKTOTAL 62 56   CKMB 2.8 2.5   CKMBINDEX -- --   TROPONINI <0.30 <0.30    Imaging results:  Dg Chest 2 View  11/27/2011  *RADIOLOGY REPORT*  Clinical Data: COPD/emphysema, shortness of breath, chest pain  CHEST - 2 VIEW  Comparison: 11/04/2008, 02/10/2011  Findings: Hyperinflation noted compatible with background emphysema, more apparent on the prior chest CTs.  No superimposed edema, pneumonia, collapse, consolidation, effusion or pneumothorax.  Trachea midline.  Normal heart size and vascularity.  Right upper lobe 17 x 8 mm nodular density present on today's study, more apparent than the prior chest x-rays.  Recommend follow- up with a repeat non emergent chest CT without contrast.  IMPRESSION: COPD/emphysema  No acute CHF or pneumonia  Developing right upper lobe 17 x 8 mm nodular density versus nodule by chest x-ray.  See above comment.  Original Report Authenticated By: Judie Petit. Ruel Favors, M.D.    Assessment & Plan:  This  is a 78 year old independently functioning gentleman with history of COPD on home oxygen and BPH who has been admitted for syncope.  1.) Syncope - given history of recently started flomax in conjunction with poor intake over the weekend and pain medication, this episode is likely a presentation of orthostatic hypotension secondary to flomax. Clinical presentation not consistent with neurologic etiology such as stroke, tumor, etc., thus CT Head not warranted at this time. Arrhythmia also possibility however less likely as EKG within normal limits.   Plan: -Hold Flomax and likely not restart until patient seen by PCP -Hydration with normal saline -Echo for syncope work-up, although no cardiac history and without murmurs on exam   2.) Right lung nodule - noted on prior CT. Was followed adequately for 2 years and stability was noted, thus no further follow-up. However CXR notes changes with nodule/density, increase in size. Given history of chronic tobacco abuse in past, this will need to be followed up with CT. No new weight loss noted. Will defer whether to do CT inpatient or outpatient with primary team.   3.) COPD - Stable. Will continue home O2, along with Advair and spiriva.   4.) Post-herpetic neuralgia - secondary to Shingles infection in march 2013. Currently stable, but consider less sedating agent such as plain tylenol, capsicum cream for pain relief upon discharge.   DVT PPX - Lovenox        Melida Quitter, M.D. (PGY3)      Date/ Time:     11/27/2011 at 2:08 pm     I have seen and examined the patient. I reviewed the resident/fellow note and agree with the findings and plan of care as documented. My additions and revisions are included.   Signature:  ____________________________________________     Internal Medicine Teaching Service Attending    Date:    ____________________________________________

## 2011-11-27 NOTE — ED Notes (Signed)
Per ems- pt coming from home where he was standing up fixing breakfast and had a syncopal episode and was assisted to a chair. Pt did have loc for approx less than one minute.

## 2011-11-27 NOTE — ED Provider Notes (Addendum)
History     CSN: [id number]  Arrival date & time 11/27/11  0800   First MD Initiated Contact with Patient 11/27/11 [phone]      Chief Complaint  Patient presents with  . Loss of Consciousness    (Consider location/radiation/quality/duration/timing/severity/associated sxs/prior treatment) HPI Comments: Patient was at home cooking breakfast when he started to feel dizzy.  Patient does wear oxygen at night and intermittently through the day if he feels he needs it.  He lives with family members and they went to get his oxygen and they put it on him but he started to slump down.  So they put a chair under him so the patient did not fall and hit his head or any other body part.  Patient denies any pain at this time.  Family notes that he was unconscious for probably about 45 seconds.  He then came around by the time EMS arrived.  He is now fully alert and oriented and at his baseline.  Patient denies any pain at this time.  He denies any chest pain, difficulty breathing, nausea, palpitations or diaphoresis.  He denies any prior cardiac history.  No fevers and no increased cough this week.  Patient is a 78 y.o. male presenting with syncope. The history is provided by the patient and a relative.  Loss of Consciousness This is a new problem. The current episode started less than 1 hour ago. The problem has been resolved. Pertinent negatives include no chest pain, no abdominal pain, no headaches and no shortness of breath. The symptoms are aggravated by nothing. The symptoms are relieved by nothing. He has tried nothing for the symptoms.    Past Medical History  Diagnosis Date  . Chronic airway obstruction, not elsewhere classified   . Unspecified essential hypertension   . Tobacco abuse     History reviewed. No pertinent past surgical history.  Family History  Problem Relation Age of Onset  . COPD Father   . Coronary artery disease Mother     History  Substance Use Topics  . Smoking  status: Former Smoker -- 1.5 packs/day for 40 years    Types: Cigarettes    Quit date: 08/29/2007  . Smokeless tobacco: Not on file  . Alcohol Use: No      Review of Systems  Constitutional: Negative.  Negative for fever and chills.  HENT: Negative.   Eyes: Negative.  Negative for discharge and redness.  Respiratory: Negative.  Negative for cough and shortness of breath.   Cardiovascular: Positive for syncope. Negative for chest pain.  Gastrointestinal: Negative.  Negative for nausea, vomiting and abdominal pain.  Genitourinary: Negative.  Negative for hematuria.  Musculoskeletal: Negative.  Negative for back pain.  Skin: Negative.  Negative for color change and rash.  Neurological: Positive for dizziness and syncope. Negative for headaches.  Hematological: Negative.  Negative for adenopathy.  Psychiatric/Behavioral: Negative.  Negative for confusion.  All other systems reviewed and are negative.    Allergies  Review of patient's allergies indicates no known allergies.  Home Medications   Current Outpatient Rx  Name Route Sig Dispense Refill  . ALBUTEROL SULFATE HFA 108 (90 BASE) MCG/ACT IN AERS Inhalation Inhale 2 puffs into the lungs every 6 (six) hours as needed. For shortness of breath    . FLUTICASONE-SALMETEROL 100-50 MCG/DOSE IN AEPB Inhalation Inhale 1 puff into the lungs 2 (two) times daily.    Marland Kitchen ENSURE HIGH PROTEIN PO Oral Take 1 Can by mouth daily.     Marland Kitchen  TAMSULOSIN HCL 0.4 MG PO CAPS Oral Take 0.4 mg by mouth daily.    Marland Kitchen TIOTROPIUM BROMIDE MONOHYDRATE 18 MCG IN CAPS Inhalation Place 18 mcg into inhaler and inhale daily.    . TRAMADOL HCL 50 MG PO TABS Oral Take 50 mg by mouth every 8 (eight) hours as needed. For pain      BP 115/60  Temp(Src) 97.7 F (36.5 C) (Oral)  Resp 18  Ht 5\' 7"  (1.702 m)  Wt 116 lb (52.617 kg)  BMI 18.17 kg/m2  SpO2 100%  Physical Exam  Nursing note and vitals reviewed. Constitutional: He is oriented to person, place, and time. He  appears well-developed and well-nourished.  Non-toxic appearance. He does not have a sickly appearance.  HENT:  Head: Normocephalic and atraumatic.  Eyes: Conjunctivae, EOM and lids are normal. Pupils are equal, round, and reactive to light.  Neck: Trachea normal, normal range of motion and full passive range of motion without pain. Neck supple.  Cardiovascular: Normal rate, regular rhythm and normal heart sounds.   Pulmonary/Chest: Effort normal and breath sounds normal. No respiratory distress.  Abdominal: Soft. Normal appearance. He exhibits no distension. There is no tenderness. There is no rebound and no CVA tenderness.  Musculoskeletal: Normal range of motion.  Neurological: He is alert and oriented to person, place, and time. He has normal strength.  Skin: Skin is warm, dry and intact. No rash noted.  Psychiatric: He has a normal mood and affect. His behavior is normal. Judgment and thought content normal.    ED Course  Procedures (including critical care time)  Results for orders placed during the hospital encounter of 11/27/11  CBC      Component Value Range   WBC 7.0  4.0 - 10.5 (K/uL)   RBC 4.57  4.22 - 5.81 (MIL/uL)   Hemoglobin 12.7 (*) 13.0 - 17.0 (g/dL)   HCT 78.2 (*) 95.6 - 52.0 (%)   MCV 83.4  78.0 - 100.0 (fL)   MCH 27.8  26.0 - 34.0 (pg)   MCHC 33.3  30.0 - 36.0 (g/dL)   RDW 21.3  08.6 - 57.8 (%)   Platelets 161  150 - 400 (K/uL)  DIFFERENTIAL      Component Value Range   Neutrophils Relative 68  43 - 77 (%)   Neutro Abs 4.7  1.7 - 7.7 (K/uL)   Lymphocytes Relative 22  12 - 46 (%)   Lymphs Abs 1.6  0.7 - 4.0 (K/uL)   Monocytes Relative 7  3 - 12 (%)   Monocytes Absolute 0.5  0.1 - 1.0 (K/uL)   Eosinophils Relative 2  0 - 5 (%)   Eosinophils Absolute 0.1  0.0 - 0.7 (K/uL)   Basophils Relative 0  0 - 1 (%)   Basophils Absolute 0.0  0.0 - 0.1 (K/uL)  BASIC METABOLIC PANEL      Component Value Range   Sodium 137  135 - 145 (mEq/L)   Potassium 4.0  3.5 - 5.1  (mEq/L)   Chloride 103  96 - 112 (mEq/L)   CO2 26  19 - 32 (mEq/L)   Glucose, Bld 92  70 - 99 (mg/dL)   BUN 18  6 - 23 (mg/dL)   Creatinine, Ser 4.69  0.50 - 1.35 (mg/dL)   Calcium 8.8  8.4 - 62.9 (mg/dL)   GFR calc non Af Amer 64 (*) >90 (mL/min)   GFR calc Af Amer 74 (*) >90 (mL/min)  CARDIAC PANEL(CRET KIN+CKTOT+MB+TROPI)  Component Value Range   Total CK 56  7 - 232 (U/L)   CK, MB 2.5  0.3 - 4.0 (ng/mL)   Troponin I <0.30  <0.30 (ng/mL)   Relative Index RELATIVE INDEX IS INVALID  0.0 - 2.5    Dg Chest 2 View  11/27/2011  *RADIOLOGY REPORT*  Clinical Data: COPD/emphysema, shortness of breath, chest pain  CHEST - 2 VIEW  Comparison: 11/04/2008, 02/10/2011  Findings: Hyperinflation noted compatible with background emphysema, more apparent on the prior chest CTs.  No superimposed edema, pneumonia, collapse, consolidation, effusion or pneumothorax.  Trachea midline.  Normal heart size and vascularity.  Right upper lobe 17 x 8 mm nodular density present on today's study, more apparent than the prior chest x-rays.  Recommend follow- up with a repeat non emergent chest CT without contrast.  IMPRESSION: COPD/emphysema  No acute CHF or pneumonia  Developing right upper lobe 17 x 8 mm nodular density versus nodule by chest x-ray.  See above comment.  Original Report Authenticated By: Judie Petit. Ruel Favors, M.D.      Date: 11/27/2011  Rate: 62  Rhythm: normal sinus rhythm  QRS Axis: LAD  Intervals: normal  ST/T Wave abnormalities: normal  Conduction Disutrbances:none  Narrative Interpretation:   Old EKG Reviewed: unchanged from 09-05-2007    MDM  Patient with syncope episode at home.  Patient will wire mesh and for further cardiac evaluation and observation given concern for possible dysrhythmia.  Patient has had no recent cardiac evaluation.  Patient incidentally does have a finding of a possible lung nodule on his chest x-ray which could be further evaluated on this admission at this time or  followed as an outpatient by his pulmonologist.        Nat Christen, MD 11/27/11 0932  Discussed with [facility] for admission for observation and likely echocardiogram.    Nat Christen, MD 11/27/11 [phone]

## 2011-11-28 ENCOUNTER — Other Ambulatory Visit: Payer: Self-pay

## 2011-11-28 LAB — BASIC METABOLIC PANEL
BUN: 15 mg/dL (ref 6–23)
Chloride: 106 mEq/L (ref 96–112)
GFR calc Af Amer: 76 mL/min — ABNORMAL LOW (ref >90)
Glucose, Bld: 95 mg/dL (ref 70–99)
Potassium: 4.5 mEq/L (ref 3.5–5.1)

## 2011-11-28 LAB — CARDIAC PANEL(CRET KIN+CKTOT+MB+TROPI): Troponin I: <0.3 ng/mL (ref <0.30)

## 2011-11-28 LAB — CBC
HCT: 33.5 % — ABNORMAL LOW (ref 39.0–52.0)
Hemoglobin: 11.2 g/dL — ABNORMAL LOW (ref 13.0–17.0)
MCHC: 33.4 g/dL (ref 30.0–36.0)
RBC: 3.98 MIL/uL — ABNORMAL LOW (ref 4.22–5.81)

## 2011-11-28 MED ORDER — ACYCLOVIR 800 MG PO TABS
800.0000 mg | ORAL_TABLET | Freq: Every day | ORAL | Status: DC
  Administered 2011-11-28 (×2): 800 mg via ORAL
  Filled 2011-11-28 (×6): qty 1

## 2011-11-28 NOTE — Clinical Documentation Improvement (Signed)
BMI DOCUMENTATION CLARIFICATION QUERY  THIS DOCUMENT IS NOT A PERMANENT PART OF THE MEDICAL RECORD  TO RESPOND TO THE THIS QUERY, FOLLOW THE INSTRUCTIONS BELOW:  1. If needed, update documentation for the patient's encounter via the notes activity.  2. Access this query again and click edit on the In [employer].  3. After updating, or not, click F2 to complete all highlighted (required) fields concerning your review. Select "additional documentation in the medical record" OR "no additional documentation provided".  4. Click Sign note button.  5. The deficiency will fall out of your In Basket *Please let us know if you are not able to complete this workflow by phone or e-mail (listed below).         11/28/11  Dear Dr. Baltazar Apo Marton Redwood  In an effort to better capture your patient's severity of illness, reflect appropriate length of stay and utilization of resources, a review of the patient medical record has revealed the following indicators.  THANK YOU.  Possible Clinical conditions - Underweight w/BMI=/< 19.0 - Other condition - Cannot Clinically determine   Risk Factors: - Sign & Symptoms: poor intake over the weekend  - BMI= 18.2 on doc flowsheet Ht/Wt for 4/1   Reviewed: additional documentation in the medical record Patient has had low BMI worked up as outpatient. Patient actually gained 5 lbs since last year. Drinks ensure.   Thank You,  Orson Ape Clinical Documentation Specialist: CELL: [phone] Health Information Management [place]

## 2011-11-28 NOTE — Discharge Summary (Signed)
Internal Medicine Teaching [facility] Discharge Note  Name: Frank Garcia MRN: [id number] DOB: 05/01/1929 78 y.o.  Date of Admission: 11/27/2011  8:00 AM Date of Discharge: 11/28/2011 Attending Physician: Ulyess Mort, MD  Discharge Diagnosis: 1. Syncope - due to orthostatic hypotension 2. Right lung nodule - noted since 2009, x-ray suggests possible enlargement 3. COPD - gold stage III 4. Post-herpetic neuralgia - s/p shingles  Discharge Medications: Medication List  As of 11/28/2011 12:28 PM   TAKE these medications         ENSURE HIGH PROTEIN PO   Take 1 Can by mouth daily.      Fluticasone-Salmeterol 100-50 MCG/DOSE Aepb   Commonly known as: ADVAIR   Inhale 1 puff into the lungs 2 (two) times daily.      PROVENTIL HFA 108 (90 BASE) MCG/ACT inhaler   Generic drug: albuterol   Inhale 2 puffs into the lungs every 6 (six) hours as needed. For shortness of breath      Tamsulosin HCl 0.4 MG Caps   Commonly known as: FLOMAX   Take 0.4 mg by mouth daily.      tiotropium 18 MCG inhalation capsule   Commonly known as: SPIRIVA   Place 18 mcg into inhaler and inhale daily.      traMADol 50 MG tablet   Commonly known as: ULTRAM   Take 50 mg by mouth every 8 (eight) hours as needed. For pain            Disposition and follow-up:   Frank Garcia was discharged from [facility] in stable and improved condition, with no further episodes of syncope.  The patient will follow-up with Dr. Melven Sartorius on 4/12, to assess for any further episodes of syncope on flomax.  The patient was also found to have a questionably enlarging RUL pulm nodule; I will call the patient's Pulmonologist to inform of this development and take action accordingly.  Follow-up Information    Follow up with [facility], MD on 12/08/2011. (9:00 am)    Contact information:   [address] [place] [address] [phone]            Follow-up  Appointments: Discharge Orders    Future Orders Please Complete By Expires   Diet - low sodium heart healthy      Increase activity slowly      Discharge instructions      Comments:   You have been hospitalized for syncope, also known as "passing out".  This was probably due to a combination of factors, including not eating or drinking very much, taking your pain medication Tramadol, and taking your new medication Flomax to improve urination.  To prevent this from happening in the future, make sure to eat and drink plenty of food and water throughout the day.   You may resume flomax ONLY AFTER you have been eating and drinking your usual amount for 2-3 days, and you have stopped taking Tramadol.   Call MD for:      Comments:   Lightheadedness, loss of consciousness, chest pain, or heart palpitations      Consultations: None  Procedures Performed:  Dg Chest 2 View  11/27/2011  *RADIOLOGY REPORT*  Clinical Data: COPD/emphysema, shortness of breath, chest pain  CHEST - 2 VIEW  Comparison: 11/04/2008, 02/10/2011  Findings: Hyperinflation noted compatible with background emphysema, more apparent on the prior chest CTs.  No superimposed edema, pneumonia, collapse, consolidation, effusion or pneumothorax.  Trachea midline.  Normal heart size and vascularity.  Right upper lobe 17 x 8 mm nodular density present on today's study, more apparent than the prior chest x-rays.  Recommend follow- up with a repeat non emergent chest CT without contrast.  IMPRESSION: COPD/emphysema  No acute CHF or pneumonia  Developing right upper lobe 17 x 8 mm nodular density versus nodule by chest x-ray.  See above comment.  Original Report Authenticated By: Judie Petit. Ruel Favors, M.D.   2D Echo:  - Procedure narrative: Transthoracic echocardiography. Image quality was poor. The study was technically difficult, as a result of poor acoustic windows. - Left ventricle: Systolic function was normal. The estimated ejection fraction  was in the range of 50% to 55%. - Atrial septum: No defect or patent foramen ovale was identified.  Admission HPI:  Patient is a 78 y.o. male with a PMHx of COPD, who presents to [facility] for evaluation of dizziness and passing out morning of admission. Patient lives with his daughter who is present at bedside. Patient reports he was fixing himself breakfast consisting of toast and bacon, when he suddenly felt dizzy. His daughter who was in the kitchen noticed he was about to fall and thus grabbed a chair and placed it beneath him. According to his daughter he was out for approximately 30-45 seconds. The daughter proceeded to call EMS. No tongue biting or limb jerking was reported and patient has no known seizure history. He denies weakness, numbness in any of his extremities. At baseline he is independent in all ALDs and IADLs. He reports feeling somewhat short of breath just prior to episode and went to get his oxygen, but unable to do so as he fell into the chair. The patient reports he is only on home oxygen at night and that during the day he does not need it. He was recently evaluated for BPH and started on flomax on Saturday Night. He received two doses. Additionally patient has been suffering from post-herpetic neuralgia and has been taking tramadol for pain relief. He had very little solid and liquid intake over the weekend due to pain from shingles. He denies chest pain, cough, headache, sudden weight loss, numbness, weakness, nausea, vomiting, and changes to bowel and urinary habits, other than frequency due to BPH.  Admission Physical Exam Wt Readings from Last 3 Encounters:   11/27/11  116 lb (52.617 kg)   11/20/11  116 lb 6.4 oz (52.799 kg)   11/13/11  116 lb (52.617 kg)    Temp Readings from Last 3 Encounters:   11/27/11  97.8 F (36.6 C)   11/20/11  96.6 F (35.9 C) Oral   11/13/11  98 F (36.7 C) Oral    BP Readings from Last 3 Encounters:   11/27/11  122/72   11/20/11  132/72    11/13/11  110/70    Pulse Readings from Last 3 Encounters:   11/27/11  64   11/20/11  68   11/13/11  86    General:  Vital signs reviewed and noted. Thin appearing man, looks stated age, in no acute distress; alert, appropriate and cooperative throughout examination.   Head:  Normocephalic, atraumatic.   Eyes:  PERRL, EOMI, No signs of anemia or jaundince.   Nose:  Mucous membranes slightly dry   Neck:  No deformities, masses, or tenderness noted.Supple, No carotid Bruits   Lungs:  Normal respiratory effort. Clear to auscultation BL without crackles or wheezes.   Heart:  RRR. S1 and S2 normal without gallop, murmur,  or rubs.   Abdomen:  BS normoactive. Soft, Nondistended, non-tender. No masses or organomegaly.   Extremities:  No pretibial edema.   Neurologic:  A&O X3, CN II - XII are grossly intact. Motor strength is 5/5 in the all 4 extremities, Sensations intact to light touch, Cerebellar signs negative.   Skin:  No visible rashes, scars.     Admission Labs Basic Metabolic Panel:  Recent Labs   [facility]  11/27/11 0828    NA  137    K  4.0    CL  103    CO2  26    GLUCOSE  92    BUN  18    CREATININE  1.07    CALCIUM  8.8    MG  --    PHOS  --    CBC:  Recent Labs   Basename  11/27/11 0828    WBC  7.0    NEUTROABS  4.7    HGB  12.7*    HCT  38.1*    MCV  83.4    PLT  161    Cardiac Enzymes:  Recent Labs   Basename  11/27/11 1150  11/27/11 0828    CKTOTAL  62  56    CKMB  2.8  2.5    CKMBINDEX  --  --    TROPONINI  <0.30  <0.30    Hospital Course by problem list: 1. Syncope - The patient presented with 1 episode of syncope while standing, after poor PO intake x2 days (low appetite due to shingles), use of tramadol, and starting a new medication Flomax 2 days prior.  The patient was given IVF, and his flomax was held, and he had no further episodes of syncope during hospitalization.  The patient was monitored via telemetry, cardiac enzymes were negative, and  Echo showed no potential cause of syncope.  The patient was discharged home, told to hold flomax until he returned to eating a full diet, then he could resume this medication, and follow-up with his PCP.  2. Right lung nodule - The patient has a history of a RUL pulmonary nodule, first noted in 2009, followed by Dr. Colletta Maryland, noted to be stable in size from 2009-2012.  On admission x-ray, the appearance of the nodule has changed, but it is unclear whether or not it has truly grown by x-ray appearance alone.  We considered repeat CT, but declined inpatient CT out of concern for the cumulative chest radiation this patient has already had for surveillance of this nodule.  Instead, we will contact Dr. Colletta Maryland, who has been following this lesion, to determine if the patient needs CT or further imaging in the outpatient setting.  3. COPD - The patient has a history of COPD, gold stage III, well-controlled with advair, spiriva, and home O2.  The patient's COPD was well-controlled during hospitalization.  4. Post-herpetic neuralgia - The patient was diagnosed with shingles 1-2 weeks prior to admission, with resolution of acute vesicles, but persistent post-herpetic neuralgia.  The patient's tramadol was continued during hospitalization.  Time spent on discharge: 45 minutes  Discharge Vitals:  BP 105/67  Pulse 66  Temp(Src) 98 F (36.7 C) (Oral)  Resp 18  Ht 5\' 7"  (1.702 m)  Wt 116 lb (52.617 kg)  BMI 18.17 kg/m2  SpO2 98%  Discharge Labs:  Results for orders placed during the hospital encounter of 11/27/11 (from the past 24 hour(s))  CARDIAC PANEL(CRET KIN+CKTOT+MB+TROPI)     Status: Normal  Collection Time   11/27/11  8:27 PM      Component Value Range   Total CK 73  7 - 232 (U/L)   CK, MB 3.0  0.3 - 4.0 (ng/mL)   Troponin I <0.30  <0.30 (ng/mL)   Relative Index RELATIVE INDEX IS INVALID  0.0 - 2.5   BASIC METABOLIC PANEL     Status: Abnormal   Collection Time   11/28/11  3:11 AM       Component Value Range   Sodium 138  135 - 145 (mEq/L)   Potassium 4.5  3.5 - 5.1 (mEq/L)   Chloride 106  96 - 112 (mEq/L)   CO2 27  19 - 32 (mEq/L)   Glucose, Bld 95  70 - 99 (mg/dL)   BUN 15  6 - 23 (mg/dL)   Creatinine, Ser 4.69  0.50 - 1.35 (mg/dL)   Calcium 8.0 (*) 8.4 - 10.5 (mg/dL)   GFR calc non Af Amer 66 (*) >90 (mL/min)   GFR calc Af Amer 76 (*) >90 (mL/min)  CBC     Status: Abnormal   Collection Time   11/28/11  3:11 AM      Component Value Range   WBC 5.2  4.0 - 10.5 (K/uL)   RBC 3.98 (*) 4.22 - 5.81 (MIL/uL)   Hemoglobin 11.2 (*) 13.0 - 17.0 (g/dL)   HCT 62.9 (*) 52.8 - 52.0 (%)   MCV 84.2  78.0 - 100.0 (fL)   MCH 28.1  26.0 - 34.0 (pg)   MCHC 33.4  30.0 - 36.0 (g/dL)   RDW 41.3  24.4 - 01.0 (%)   Platelets 152  150 - 400 (K/uL)  TSH     Status: Normal   Collection Time   11/28/11  3:11 AM      Component Value Range   TSH 0.990  0.350 - 4.500 (uIU/mL)  CARDIAC PANEL(CRET KIN+CKTOT+MB+TROPI)     Status: Normal   Collection Time   11/28/11  3:19 AM      Component Value Range   Total CK 64  7 - 232 (U/L)   CK, MB 2.7  0.3 - 4.0 (ng/mL)   Troponin I <0.30  <0.30 (ng/mL)   Relative Index RELATIVE INDEX IS INVALID  0.0 - 2.5     Signed: Janalyn Harder 11/28/2011, 12:28 PM

## 2011-11-28 NOTE — H&P (Signed)
  70 man with COPD adm for dizzyness and episode of fall with 40 seconds LOC.  No prodrome or sequelae.  Developed shingles 1 week before and started on acyclovir and tramadol.  Also during past 1 week, started on tamsulosin for prostatism.  Has been eating poorly due to pain.  Believes appetite and pain are improving.  Minimal orthostatic drop.  Nl EKG.  Only other problem is a very small RUL nodule under follow-up since 2009.  Dr. Marchelle Gearing has been following this.  Some discrepant signal from CXR vs latest CT.  Suggest referring to Dr. Marchelle Gearing about this.  May have to restart flomax slowly as pain subsides and appetite improves.

## 2011-11-28 NOTE — Progress Notes (Signed)
UR COMPLETED  

## 2011-11-28 NOTE — Progress Notes (Signed)
Subjective: The patient notes no acute events overnight.  Patient feels better after IV fluids.  Patient wants to go home, and is very appreciative of our care.  Objective: Vital signs in last 24 hours: Filed Vitals:   11/27/11 1958 11/27/11 2100 11/28/11 0500 11/28/11 0837  BP:  118/62 105/67   Pulse:  67 66   Temp:  97.9 F (36.6 C) 98 F (36.7 C)   TempSrc:  Oral Oral   Resp:  18 18   Height:      Weight:      SpO2: 97% 99% 97% 98%   Weight change:  No intake or output data in the 24 hours ending 11/28/11 1932 Physical Exam: General: alert, cooperative, and in no apparent distress HEENT: pupils equal round and reactive to light, vision grossly intact, oropharynx clear and non-erythematous  Neck: supple, no lymphadenopathy Lungs: clear to ascultation bilaterally, normal work of respiration, no wheezes, rales, ronchi Heart: regular rate and rhythm, no murmurs, gallops, or rubs Abdomen: soft, non-tender, non-distended, normal bowel sounds Extremities: no cyanosis, clubbing, or edema Neurologic: alert & oriented X3, cranial nerves II-XII intact, strength grossly intact, sensation intact to light touch  Lab Results: Basic Metabolic Panel:  Lab 11/28/11 2130 11/27/11 0828  NA 138 137  K 4.5 4.0  CL 106 103  CO2 27 26  GLUCOSE 95 92  BUN 15 18  CREATININE 1.04 1.07  CALCIUM 8.0* 8.8  MG -- --  PHOS -- --   CBC:  Lab 11/28/11 0311 11/27/11 0828  WBC 5.2 7.0  NEUTROABS -- 4.7  HGB 11.2* 12.7*  HCT 33.5* 38.1*  MCV 84.2 83.4  PLT 152 161   Cardiac Enzymes:  Lab 11/28/11 0319 11/27/11 2027 11/27/11 1150  CKTOTAL 64 73 62  CKMB 2.7 3.0 2.8  CKMBINDEX -- -- --  TROPONINI <0.30 <0.30 <0.30   Thyroid Function Tests:  Lab 11/28/11 0311  TSH 0.990  T4TOTAL --  FREET4 --  T3FREE --  THYROIDAB --    Medications: I have reviewed the patient's current medications. Scheduled Meds:   . DISCONTD: acyclovir  800 mg Oral 5 X Daily  . DISCONTD: antiseptic oral  rinse  15 mL Mouth Rinse BID  . DISCONTD: enoxaparin  40 mg Subcutaneous Q24H  . DISCONTD: Fluticasone-Salmeterol  1 puff Inhalation BID  . DISCONTD: sodium chloride  3 mL Intravenous Q12H  . DISCONTD: tiotropium  18 mcg Inhalation Daily   Continuous Infusions:   . sodium chloride 100 mL/hr at 11/27/11 1305   PRN Meds:.DISCONTD: albuterol, DISCONTD: traMADol Assessment/Plan: The patient is an 78 yo man, presenting with syncope  # Syncope - the patient notes syncope after poor PO intake x2 days, use of tramadol, and use of flomax (new medication) x2 days.  Patient feels much better after IVF.  No further episodes of syncope during hospitalization. -d/c IV fluids -patient instructed to hold flomax until tolerating full PO diet and off tramadol -echo showed EF 50-55%, with no ASD or PFO  # Right lung nodule - followed since 2009 by Ramaswami, now appears somewhat increased in size -attempted to contact patient's Pulmonologist, Ramaswami, today, but he is off for the day.  Will touch base in am regarding need for repeat outpatient CT scan or further evaluation.  # COPD - chronic stbale -continue advair, spiriva, and home O2  # Post-herpetic neuralgia - secondary to shingles -continue tramadol  # Prophy - lovenox  # Dispo - d/c today, patient instructed to hold flomax  until tolerating full PO and off tramadol.    LOS: 1 day   Janalyn Harder 11/28/2011, 7:32 PM

## 2011-11-28 NOTE — Discharge Instructions (Signed)
You have been hospitalized for syncope, also known as "passing out".  This was probably due to a combination of factors, including not eating or drinking very much, taking your pain medication Tramadol, and taking your new medication Flomax to improve urination.  To prevent this from happening in the future, make sure to eat and drink plenty of food and water throughout the day.   You may resume flomax ONLY AFTER you have been eating and drinking your usual amount for 2-3 days, and you have stopped taking Tramadol.  Syncope You have had a fainting (syncopal) spell. A fainting episode is a sudden, short-lived loss of consciousness. It results in complete recovery. It occurs because there has been a temporary shortage of oxygen and/or sugar (glucose) to the brain. CAUSES   Blood pressure pills and other medications that may lower blood pressure below normal. Sudden changes in posture (sudden standing).   Over-medication. Take your medications as directed.   Standing too long. This can cause blood to pool in the legs.   Seizure disorders.   Low blood sugar (hypoglycemia) of diabetes. This more commonly causes coma.   Bearing down to go to the bathroom. This can cause your blood pressure to rise suddenly. Your body compensates by making the blood pressure too low when you stop bearing down.   Hardening of the arteries where the brain temporarily does not receive enough blood.   Irregular heart beat and circulatory problems.   Fear, emotional distress, injury, sight of blood, or illness.  Your caregiver will send you home if the syncope was from non-worrisome causes (benign). Depending on your age and health, you may stay to be monitored and observed. If you return home, have someone stay with you if your caregiver feels that is desirable. It is very important to keep all follow-up referrals and appointments in order to properly manage this condition. This is a serious problem which can lead to  serious illness and death if not carefully managed.  WARNING: Do not drive or operate machinery until your caregiver feels that it is safe for you to do so. SEEK IMMEDIATE MEDICAL CARE IF:   You have another fainting episode or faint while lying or sitting down. DO NOT DRIVE YOURSELF. Call 911 if no other help is available.   You have chest pain, are feeling sick to your stomach (nausea), vomiting or abdominal pain.   You have an irregular heartbeat or one that is very fast (pulse over 120 beats per minute).   You have a loss of feeling in some part of your body or lose movement in your arms or legs.   You have difficulty with speech, confusion, severe weakness, or visual problems.   You become sweaty and/or feel light headed.  Make sure you are rechecked as instructed. Document Released: 08/14/2005 Document Revised: 08/03/2011 Document Reviewed: 04/04/2007 [facility] Patient Information 2012 [place], [place].

## 2011-12-02 ENCOUNTER — Telehealth: Payer: Self-pay | Admitting: Internal Medicine

## 2011-12-02 DIAGNOSIS — R911 Solitary pulmonary nodule: Secondary | ICD-10-CM

## 2011-12-02 NOTE — Telephone Encounter (Signed)
Call from Dr Janalyn Harder PGY 1, cxr 11/27/11 during admit for syncope showed increased prominence RUL density. Please arragne for CT chest without contrast (ordered) and followup < 1 month

## 2011-12-05 NOTE — Telephone Encounter (Signed)
Pt advised and order placed. Jisele Price, CMA  

## 2011-12-08 ENCOUNTER — Telehealth: Payer: Self-pay | Admitting: Internal Medicine

## 2011-12-08 NOTE — Telephone Encounter (Signed)
It does not look like the CT was scheduled. I called and spoke with Almyra Free and she will take care of this today.Carron Curie, CMA

## 2011-12-08 NOTE — Telephone Encounter (Signed)
jenn--it looks like [facility] send this to you and you had scheduled the ct scan and spoke with the pt about this.  Is there something that i am missing?  Please advise. thanks

## 2011-12-11 ENCOUNTER — Ambulatory Visit (INDEPENDENT_AMBULATORY_CARE_PROVIDER_SITE_OTHER)
Admission: RE | Admit: 2011-12-11 | Discharge: 2011-12-11 | Disposition: A | Discharge status: Home / Self Care | Payer: Medicare Other | Source: Ambulatory Visit | Attending: Internal Medicine | Admitting: Internal Medicine

## 2011-12-11 DIAGNOSIS — R911 Solitary pulmonary nodule: Secondary | ICD-10-CM

## 2012-01-23 ENCOUNTER — Encounter: Payer: Self-pay | Admitting: Internal Medicine

## 2012-01-23 ENCOUNTER — Ambulatory Visit (INDEPENDENT_AMBULATORY_CARE_PROVIDER_SITE_OTHER): Payer: Medicare Other | Admitting: Internal Medicine

## 2012-01-23 VITALS — BP 128/72 | HR 71 | Temp 98.0°F | Ht 67.0 in | Wt 118.4 lb

## 2012-01-23 DIAGNOSIS — J441 Chronic obstructive pulmonary disease with (acute) exacerbation: Secondary | ICD-10-CM

## 2012-01-23 DIAGNOSIS — R490 Dysphonia: Secondary | ICD-10-CM | POA: Insufficient documentation

## 2012-01-23 MED ORDER — DOXYCYCLINE HYCLATE 100 MG PO TABS: 100.0000 mg | ORAL_TABLET | Freq: Two times a day (BID) | ORAL | Status: DC

## 2012-01-23 MED ORDER — PREDNISONE 10 MG PO TABS: ORAL_TABLET | ORAL | Status: DC

## 2012-01-23 MED ORDER — DOXYCYCLINE HYCLATE 100 MG PO TABS: 100.0000 mg | ORAL_TABLET | Freq: Two times a day (BID) | ORAL | Status: AC

## 2012-01-23 NOTE — Progress Notes (Signed)
Subjective:    Patient ID: Frank Garcia, male    DOB: Nov 15, 1928, 78 y.o.   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 No further fu  # ex-smoker,   #abnromal weight loss  - Body mass index is 18.17 kg/(m^2). on 11/13/2011 (weight 116# and up 5# since June 2012)  - Body mass index is 18.54 kg/(m^2). on 01/23/2012  #Shingles - MArch 2013       OV 11/13/2011 Followup COPD, weight loss. : Lab result reviewed: Alpha 1 is MM from June 2012. OVerall stable. Gained 5# weight. Daughter with him today. Says mostly sedentary. Does some work around house (folding clothes, vacuum) and does it well at his own pace without dyspnea. Past 1 month, increased cough, dyspnea, congestion, sputum is thick and tough to get out but when it comes out is dark in color. Feels symptoms are worse in bad weather days like today (40s and cold and rainy). Mild-moderate in intenseity. In additiion new complaint of pain around left hip x 7 days, mild, constant, dull, no radiation, no aggravating or relieving factors. Exams shows zoster rash - noted only today    11/20/2011 follow up  Pt returns for follow up . Patient was seen in the office. Last week for a COPD exacerbation. Also, noted to have acute herpes zoster flare. He was treated with doxycycline and prednisone taper and Acyclovir . He returns today with decreased cough and congestion. Says that his rash is drying out. However , still has pain, along the hip area. Reviewed all his medication list.  and they appear to be correct. Unfortunately, he did not bring his medications in today. He denies any chest pain, hemoptysis, abdominal pain, nausea, vomiting, or leg swelling.     Finish Acyclovir  and prednisone as directed.  May get Capsacin cream -apply to left hip/back area As needed For pain.  May use Tylenol As needed Pain .  May use Tramadol 50mg  every 8 hrs as needed for pain. -may make you sleepy.  Continue on Advair and Spiriva .  follow up Dr. Marchelle Gearing in 3 months and As needed  If pain from shingles does not resolve will need to see your family doctor.     OV 01/23/2012  Followoup copd and cachexia and shingles  - Past 2-3 weeks having new hoarse voice and insidious onset of worsening orthopnea (using3-4 pilloows instead of 1), and few episodes of paroxysmal nocturnal dyspnea without chest pain or pedal edema. Also having increased cough, chest congestion and change in sputum color from white to mild yellow. However, there is no increase in sputum volume though says he feels he has sputum inside chest but he is unable to bring it out. CAT score is 21 today   - In terms of cachexia - weight stable to mild gain +   - Shingles - resolved per hx    CAT COPD Symptom and Quality of Life Score (glaxo smith kline trademark)  0 (no burden) to 5 (highest burden)  Never Cough -> Cough all the time 3  No phlegm in chest -> Chest is full of phlegm 3  No chest tightness -> Chest feels  very tight 1  No dyspnea for 1 flight stairs/hill -> Very dyspneic for 1 flight of stairs 4  No limitations for ADL at home -> Very limited with ADL at home 2  Confident leaving home -> Not at all confident leaving home 1  Sleep soundly -> Do not sleep soundly because of lung condition 4  Lots of Energy -> No energy at all 3  TOTAL Score (max 40)  21   Past, Family, Social reviewed: overall no changes - saw urology recently    Review of Systems  Constitutional: Negative for fever and unexpected weight change.  HENT: Positive for congestion. Negative for ear pain, nosebleeds, sore throat, rhinorrhea, sneezing, trouble swallowing, dental problem, postnasal drip and sinus pressure.   Eyes:  Negative for redness and itching.  Respiratory: Positive for shortness of breath. Negative for cough, chest tightness and wheezing.   Cardiovascular: Negative for palpitations and leg swelling.  Gastrointestinal: Negative for nausea and vomiting.  Genitourinary: Negative for dysuria.  Musculoskeletal: Negative for joint swelling.  Skin: Negative for rash.  Neurological: Negative for headaches.  Hematological: Does not bruise/bleed easily.  Psychiatric/Behavioral: Negative for dysphoric mood. The patient is not nervous/anxious.        Objective:   Physical Exam GEN: A/Ox3; pleasant , NAD, thin frail  - SPEAKING WITH SQUEAKY  VOICE +  HEENT:  [place]/AT,  EACs-clear, TMs-wnl, NOSE-clear, THROAT-clear, no lesions, no postnasal drip or exudate noted.   NECK:  Supple w/ fair ROM; no JVD; normal carotid impulses w/o bruits; no thyromegaly or nodules palpated; no lymphadenopathy.  RESP  Coarse BS , diminished bS in bases no accessory muscle use, no dullness to percussion  CARD:  RRR, no m/r/g  , no peripheral edema, pulses intact, no cyanosis or clubbing.  GI:   Soft & nt; nml bowel sounds; no organomegaly or masses detected.  Musco: Warm bil, no deformities or joint swelling noted.   Neuro: alert, no focal deficits noted.    Skin: Warm, along left hip area dry skin in lumbar area. No evidence of shingles          Assessment & Plan:

## 2012-01-23 NOTE — Patient Instructions (Addendum)
#  COPD flare or attack You have mild attack of copd called COPD exacerbation Please take doxycycline 100mg  twice daily after meals x 5 days; avoid sunlight Please take prednisone 40mg  once daily x 3 days, then 20mg  once daily x 3 days, then 10mg  once daily x 3 days, then 5mg  once dailyx 3 days and stop  #Hoarse voice  - I think this is due to viral or bacterial current illness causing copd flare  - If this does not resolve in 1 month, let us know and we can consider ENT referral  #Followup  2-3 months or sooner if needed If not getting better, call or come sooner

## 2012-01-28 DIAGNOSIS — R490 Dysphonia: Secondary | ICD-10-CM | POA: Insufficient documentation

## 2012-01-28 NOTE — Assessment & Plan Note (Signed)
#  COPD flare or attack You have mild attack of copd called COPD exacerbation Please take doxycycline 100mg  twice daily after meals x 5 days; avoid sunlight Please take prednisone 40mg  once daily x 3 days, then 20mg  once daily x 3 days, then 10mg  once daily x 3 days, then 5mg  once dailyx 3 days and stop  #Followup  2-3 months or sooner if needed If not getting better, call or come sooner

## 2012-01-28 NOTE — Assessment & Plan Note (Signed)
#  Hoarse voice  - I think this is due to viral or bacterial current illness causing copd flare  - If this does not resolve in 1 month, let us know and we can consider ENT referral

## 2012-03-10 ENCOUNTER — Other Ambulatory Visit: Payer: Self-pay | Admitting: Internal Medicine

## 2012-03-14 ENCOUNTER — Other Ambulatory Visit: Payer: Self-pay | Admitting: Internal Medicine

## 2012-03-14 MED ORDER — FLUTICASONE-SALMETEROL 100-50 MCG/DOSE IN AEPB: 1.0000 | INHALATION_SPRAY | Freq: Two times a day (BID) | RESPIRATORY_TRACT | Status: DC

## 2012-03-14 NOTE — Telephone Encounter (Signed)
Pharmacy requesting a 90 day supply for pt  advair 100-50  rx sent

## 2012-04-05 ENCOUNTER — Ambulatory Visit (INDEPENDENT_AMBULATORY_CARE_PROVIDER_SITE_OTHER): Payer: Medicare Other | Admitting: Internal Medicine

## 2012-04-05 ENCOUNTER — Encounter: Payer: Self-pay | Admitting: Internal Medicine

## 2012-04-05 VITALS — BP 106/62 | HR 62 | Temp 97.0°F | Ht 67.5 in | Wt 116.4 lb

## 2012-04-05 DIAGNOSIS — J449 Chronic obstructive pulmonary disease, unspecified: Secondary | ICD-10-CM

## 2012-04-05 DIAGNOSIS — R0982 Postnasal drip: Secondary | ICD-10-CM

## 2012-04-05 DIAGNOSIS — R490 Dysphonia: Secondary | ICD-10-CM

## 2012-04-05 MED ORDER — FLUTICASONE PROPIONATE 50 MCG/ACT NA SUSP: 2.0000 | Freq: Every day | NASAL | Status: DC

## 2012-04-05 MED ORDER — ROFLUMILAST 500 MCG PO TABS: 500.0000 ug | ORAL_TABLET | Freq: Every day | ORAL | Status: DC

## 2012-04-05 NOTE — Progress Notes (Signed)
Subjective:    Patient ID: Frank Garcia, male    DOB: 20-Nov-1928, 78 y.o.   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 No further fu  # ex-smoker,   #abnromal weight loss  - Body mass index is 18.17 kg/(m^2). on 11/13/2011 (weight 116# and up 5# since June 2012)  - Body mass index is 18.54 kg/(m^2). on 01/23/2012  #Shingles - MArch 2013       OV 11/13/2011 Followup COPD, weight loss. : Lab result reviewed: Alpha 1 is MM from June 2012. OVerall stable. Gained 5# weight. Daughter with him today. Says mostly sedentary. Does some work around house (folding clothes, vacuum) and does it well at his own pace without dyspnea. Past 1 month, increased cough, dyspnea, congestion, sputum is thick and tough to get out but when it comes out is dark in color. Feels symptoms are worse in bad weather days like today (40s and cold and rainy). Mild-moderate in intenseity. In additiion new complaint of pain around left hip x 7 days, mild, constant, dull, no radiation, no aggravating or relieving factors. Exams shows zoster rash - noted only today    11/20/2011 follow up  Pt returns for follow up . Patient was seen in the office. Last week for a COPD exacerbation. Also, noted to have acute herpes zoster flare. He was treated with doxycycline and prednisone taper and Acyclovir . He returns today with decreased cough and congestion. Says that his rash is drying out. However , still has pain, along the hip area. Reviewed all his medication list.  and they appear to be correct. Unfortunately, he did not bring his medications in today. He denies any chest pain, hemoptysis, abdominal pain, nausea, vomiting, or leg  swelling.     Finish Acyclovir and prednisone as directed.  May get Capsacin cream -apply to left hip/back area As needed For pain.  May use Tylenol As needed Pain .  May use Tramadol 50mg  every 8 hrs as needed for pain. -may make you sleepy.  Continue on Advair and Spiriva .  follow up Dr. Marchelle Gearing in 3 months and As needed  If pain from shingles does not resolve will need to see your family doctor.     OV 01/23/2012  Followoup copd and cachexia and shingles  - Past 2-3 weeks having new hoarse voice and insidious onset of worsening orthopnea (using3-4 pilloows instead of 1), and few episodes of paroxysmal nocturnal dyspnea without chest pain or pedal edema. Also having increased cough, chest congestion and change in sputum color from white to mild yellow. However, there is no increase in sputum volume though says he feels he has sputum inside chest but he is unable to bring it out. CAT score is 21 today   - In terms of cachexia - weight stable to mild gain +   - Shingles - resolved per hx  Past, Family, Social reviewed: overall no changes - saw urology recently  [facility] #COPD flare or attack You have mild attack of copd called COPD exacerbation Please take doxycycline 100mg  twice daily after meals x 5 days; avoid sunlight Please take prednisone 40mg   once daily x 3 days, then 20mg  once daily x 3 days, then 10mg  once daily x 3 days, then 5mg  once dailyx 3 days and stop  #Hoarse voice  - I think this is due to viral or bacterial current illness causing copd flare  - If this does not resolve in 1 month, let us know and we can consider ENT referral  #Followup  2-3 months or sooner if needed If not getting better, call or come sooner   OV 04/05/2012  Followup COPD. POVerall unchnaged. COPD Cat score below continues to be 21. Hoarse voice unchanged and persists. He is interested in knowing long term prognosis and expectatins with his copd  CAT COPD Symptom and Quality of Life Score  (glaxo smith kline trademark)  0 (no burden) to 5 (highest burden) May 2013 04/05/2012   Never Cough -> Cough all the time 3 3  No phlegm in chest -> Chest is full of phlegm 3 3  No chest tightness -> Chest feels very tight 1 1  No dyspnea for 1 flight stairs/hill -> Very dyspneic for 1 flight of stairs 4 4  No limitations for ADL at home -> Very limited with ADL at home 2 3  Confident leaving home -> Not at all confident leaving home 1 1  Sleep soundly -> Do not sleep soundly because of lung condition 4 2  Lots of Energy -> No energy at all 3 4  TOTAL Score (max 40)  21 21   Past, Family, Social reviewed: no change since last visit  Review of Systems  Constitutional: Negative for fever and unexpected weight change.  HENT: Positive for congestion, rhinorrhea, sneezing and postnasal drip. Negative for ear pain, nosebleeds, sore throat, trouble swallowing, dental problem and sinus pressure.   Eyes: Negative for redness and itching.  Respiratory: Positive for cough and shortness of breath. Negative for chest tightness and wheezing.   Cardiovascular: Negative for palpitations and leg swelling.  Gastrointestinal: Negative for nausea and vomiting.  Genitourinary: Negative for dysuria.  Musculoskeletal: Negative for joint swelling.  Skin: Negative for rash.  Neurological: Negative for headaches.  Hematological: Does not bruise/bleed easily.  Psychiatric/Behavioral: Negative for dysphoric mood. The patient is not nervous/anxious.        Objective:   Physical Exam   GEN: A/Ox3; pleasant , NAD, thin frail  - SPEAKING WITH SQUEAKY  VOICE +  HEENT:  [place]/AT,  EACs-clear, TMs-wnl, NOSE-clear, THROAT-clear, no lesions, no postnasal drip or exudate noted.   NECK:  Supple w/ fair ROM; no JVD; normal carotid impulses w/o bruits; no thyromegaly or nodules palpated; no lymphadenopathy.  RESP  Coarse BS , diminished bS in bases no accessory muscle use, no dullness to percussion  CARD:  RRR, no  m/r/g  , no peripheral edema, pulses intact, no cyanosis or clubbing.  GI:   Soft & nt; nml bowel sounds; no organomegaly or masses detected.  Musco: Warm bil, no deformities or joint swelling noted.   Neuro: alert, no focal deficits noted.    Skin: Warm, along left hip area dry skin in lumbar area. No evidence of shingles              Assessment & Plan:

## 2012-04-05 NOTE — Patient Instructions (Addendum)
#  COPD  - currently stable. You and I discussed outlook for the disease - continue your inhalers spiriva and advair  - start roflumilast 1 tab every other day after food for 1 week, then daily after food - this is designed to prevent you from getting sick  - take 30 day sample, escript sent but fill it only a month from now  - if you have side effects esp nausea and gi let us know -   #Hoarse voice - likely due to post nasal drip, copd and possibly acid reflux  - start generic fluticasone inhaler 2 squirts each nostril daily  (currently given one sample of omnaris)  - I have referred you to ENT specialist to make sure vocal cord is okay    #Followup  6 months or sooner if needed If not getting better, call or come sooner COPD CAT score at followup

## 2012-04-14 NOTE — Assessment & Plan Note (Signed)
#  Hoarse voice - likely due to post nasal drip, copd and possibly acid reflux  - start generic fluticasone inhaler 2 squirts each nostril daily  (currently given one sample of omnaris)  - I have referred you to ENT specialist to make sure vocal cord is okay

## 2012-04-14 NOTE — Assessment & Plan Note (Signed)
#  COPD  - currently stable. You and I discussed outlook for the disease - continue your inhalers spiriva and advair  - start roflumilast 1 tab every other day after food for 1 week, then daily after food - this is designed to prevent you from getting sick  - take 30 day sample, escript sent but fill it only a month from now  - if you have side effects esp nausea and gi let us know -     #Followup  6 months or sooner if needed If not getting better, call or come sooner COPD CAT score at followup

## 2012-06-08 ENCOUNTER — Other Ambulatory Visit: Payer: Self-pay | Admitting: Internal Medicine

## 2012-08-13 ENCOUNTER — Telehealth: Payer: Self-pay | Admitting: Internal Medicine

## 2012-08-13 DIAGNOSIS — R0982 Postnasal drip: Secondary | ICD-10-CM

## 2012-08-13 DIAGNOSIS — J449 Chronic obstructive pulmonary disease, unspecified: Secondary | ICD-10-CM

## 2012-08-13 DIAGNOSIS — R490 Dysphonia: Secondary | ICD-10-CM

## 2012-08-13 NOTE — Telephone Encounter (Signed)
daliresp 500 mcg # 90 needs refill

## 2012-08-13 NOTE — Telephone Encounter (Signed)
Pt needs to have a 90 day supply per insurance .

## 2012-08-14 MED ORDER — ROFLUMILAST 500 MCG PO TABS: 500.0000 ug | ORAL_TABLET | Freq: Every day | ORAL | Status: DC

## 2012-08-14 NOTE — Telephone Encounter (Signed)
Refill sent. Jennifer Castillo, CMA  

## 2012-08-27 ENCOUNTER — Encounter: Payer: Self-pay | Admitting: *Deleted

## 2012-08-27 ENCOUNTER — Telehealth: Payer: Self-pay | Admitting: Internal Medicine

## 2012-08-27 NOTE — Telephone Encounter (Signed)
Yes, please do a letter

## 2012-08-27 NOTE — Telephone Encounter (Signed)
Spoke with pt He states needing a letter from [facility] stating that he is unable to push or pull spouse in a wheelchair due to COPD He states that they can get a motorized wheelchair cheaper if we do the letter for him Please advise if okay to do letter thanks

## 2012-08-27 NOTE — Telephone Encounter (Signed)
Spoke with patient he is aware letter has been faxed to [phone].  Nothing further needed at this time.

## 2012-08-30 ENCOUNTER — Telehealth: Payer: Self-pay | Admitting: Internal Medicine

## 2012-08-30 NOTE — Telephone Encounter (Signed)
Spoke with patient, informed him that letter was faxed 08/27/12 to same number [phone].  I informed patient that this letter has been REfaxed to number.  I asked patient for medical equip. Supply telephone number to call them to verify if received, patient stated he would call them himself and call us back if they have not received.  Apolgozed for inconvenience, nothing further needed.

## 2012-09-23 ENCOUNTER — Encounter: Payer: Self-pay | Admitting: Internal Medicine

## 2012-09-23 ENCOUNTER — Ambulatory Visit (INDEPENDENT_AMBULATORY_CARE_PROVIDER_SITE_OTHER): Payer: Medicare Other | Admitting: Internal Medicine

## 2012-09-23 VITALS — BP 108/62 | HR 76 | Temp 97.6°F | Ht 67.0 in | Wt 114.0 lb

## 2012-09-23 DIAGNOSIS — R339 Retention of urine, unspecified: Secondary | ICD-10-CM

## 2012-09-23 DIAGNOSIS — R3911 Hesitancy of micturition: Secondary | ICD-10-CM

## 2012-09-23 DIAGNOSIS — R490 Dysphonia: Secondary | ICD-10-CM

## 2012-09-23 DIAGNOSIS — J449 Chronic obstructive pulmonary disease, unspecified: Secondary | ICD-10-CM

## 2012-09-23 DIAGNOSIS — J3489 Other specified disorders of nose and nasal sinuses: Secondary | ICD-10-CM

## 2012-09-23 DIAGNOSIS — J4489 Other specified chronic obstructive pulmonary disease: Secondary | ICD-10-CM

## 2012-09-23 NOTE — Patient Instructions (Addendum)
#  COPD  - currently stable.  - continue your inhalers spiriva and advair but could change spiriva to tudorza based on my discussion with urologist  -Continue start roflumilast  -   #Hoarse voice - likely due to post nasal drip, copd and possibly acid reflux and inhalers  - monitor this given negative ENT workup   #Cough and sinus drainage  -= Please start Nettie pot saline wash and inhaled nasal steroid: Nurse will ensure adequate refills  #Urinary retention  - I am going to talk to your urologist about the role of spiriva n this problem   #Followup  4 months or sooner if needed If not getting better, call or come sooner COPD CAT score at followup

## 2012-09-23 NOTE — Progress Notes (Signed)
Subjective:    Patient ID: Frank Garcia, male    DOB: [birth year], 79 y.o.   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 No further fu  # ex-smoker,   #abnromal weight loss  - Body mass index is 18.17 kg/(m^2). on 11/13/2011 (weight 116# and up 5# since June 2012)  - Body mass index is 18.54 kg/(m^2). on 01/23/2012 - Body mass index is 17.85 kg/(m^2). on 09/23/2012    #Shingles - MArch 2013       OV 11/13/2011 Followup COPD, weight loss. : Lab result reviewed: Alpha 1 is MM from June 2012. OVerall stable. Gained 5# weight. Daughter with him today. Says mostly sedentary. Does some work around house (folding clothes, vacuum) and does it well at his own pace without dyspnea. Past 1 month, increased cough, dyspnea, congestion, sputum is thick and tough to get out but when it comes out is dark in color. Feels symptoms are worse in bad weather days like today (40s and cold and rainy). Mild-moderate in intenseity. In additiion new complaint of pain around left hip x 7 days, mild, constant, dull, no radiation, no aggravating or relieving factors. Exams shows zoster rash - noted only today    11/20/2011 follow up  Pt returns for follow up . Patient was seen in the office. Last week for a COPD exacerbation. Also, noted to have acute herpes zoster flare. He was treated with doxycycline and prednisone taper and Acyclovir . He returns today with decreased cough and congestion. Says that his rash is drying out. However , still has pain, along the hip area. Reviewed all his medication list.  and they appear to be correct. Unfortunately, he did not bring his medications in today. He denies any chest pain,  hemoptysis, abdominal pain, nausea, vomiting, or leg swelling.     Finish Acyclovir and prednisone as directed.  May get Capsacin cream -apply to left hip/back area As needed For pain.  May use Tylenol As needed Pain .  May use Tramadol 50mg  every 8 hrs as needed for pain. -may make you sleepy.  Continue on Advair and Spiriva .  follow up Dr. Marchelle Gearing in 3 months and As needed  If pain from shingles does not resolve will need to see your family doctor.     OV 01/23/2012  Followoup copd and cachexia and shingles  - Past 2-3 weeks having new hoarse voice and insidious onset of worsening orthopnea (using3-4 pilloows instead of 1), and few episodes of paroxysmal nocturnal dyspnea without chest pain or pedal edema. Also having increased cough, chest congestion and change in sputum color from white to mild yellow. However, there is no increase in sputum volume though says he feels he has sputum inside chest but he is unable to bring it out. CAT score is 21 today   - In terms of cachexia - weight stable to mild gain +   - Shingles - resolved per hx  Past, Family, Social reviewed: overall no changes - saw urology recently  [facility] #COPD flare or attack You have mild attack of copd called COPD exacerbation Please take doxycycline 100mg  twice daily  after meals x 5 days; avoid sunlight Please take prednisone 40mg  once daily x 3 days, then 20mg  once daily x 3 days, then 10mg  once daily x 3 days, then 5mg  once dailyx 3 days and stop  #Hoarse voice  - I think this is due to viral or bacterial current illness causing copd flare  - If this does not resolve in 1 month, let us know and we can consider ENT referral  #Followup  2-3 months or sooner if needed If not getting better, call or come sooner   OV 04/05/2012  Followup COPD. POVerall unchnaged. COPD Cat score below continues to be 21. Hoarse voice unchanged and persists. He is interested in knowing long term prognosis and expectatins with his  copd\   Past, Family, Social reviewed: no change since last visit  #COPD  - currently stable. You and I discussed outlook for the disease  - continue your inhalers spiriva and advair  - start roflumilast 1 tab every other day after food for 1 week, then daily after food - this is designed to prevent you from getting sick  - take 30 day sample, escript sent but fill it only a month from now  - if you have side effects esp nausea and gi let us know  -  #Hoarse voice  - likely due to post nasal drip, copd and possibly acid reflux  - start generic fluticasone inhaler 2 squirts each nostril daily (currently given one sample of omnaris)  - I have referred you to ENT specialist to make sure vocal cord is okay  #Followup  6 months or sooner if needed  If not getting better, call or come sooner  COPD CAT score at followup  OV 09/23/2012  Followup COPD and hoarse voice and sinus drainage  - In terms of COPD and sinus drainage: Overall stable. CAT score is at 17 and shows improvement from baseline 21. However he notices that he is coughing more since prior visit in October 2013. It appears that the increase in cough is only mild and is associated with sinus drainage and nasal drainage that is clear. He admits that he is not compliant with his nasal steroids and saline nasal wash. He admits that in the past when he did nasal treatment and irrigation sinus drainage and cough improved. Cough is associated with clear sputum. There is no worsening of dyspnea or other symptoms of COPD.  - In terms of hoarse voice: This is largely improved. 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 Though his hoarseness is improved it is nowhere as good as his voice was several years ago. He notices while singing in the church. I've explained to him in inhaler,  sinus drainage and singing contributing into hoarseness  - New problem: Past 6 months has noticed difficulty passing urine. He  has seen urologist and has been placed on Flomax. Flomax helps a lot however notices that he is unable to sleep entire night and also some chest congestion which she attributes to Flomax. Of note, he is on Spiriva which is known to cause urinary retention   CAT COPD Symptom and Quality of Life Score (glaxo smith kline trademark)   0 (no burden) to 5 (highest burden) May 2013 04/05/2012  09/23/2012   Never Cough -> Cough all the time 3 3 2   No phlegm in chest -> Chest is full of phlegm 3 3 3   No chest tightness -> Chest feels very tight  1 1 0  No dyspnea for 1 flight stairs/hill -> Very dyspneic for 1 flight of stairs 4 4 5   No limitations for ADL at home -> Very limited with ADL at home 2 3 3   Confident leaving home -> Not at all confident leaving home 1 1 0  Sleep soundly -> Do not sleep soundly because of lung condition 4 2 2   Lots of Energy -> No energy at all 3 4 2   TOTAL Score (max 40)  21 21 17      Review of Systems  Constitutional: Negative for fever and unexpected weight change.  HENT: Negative for ear pain, nosebleeds, congestion, sore throat, rhinorrhea, sneezing, trouble swallowing, dental problem, postnasal drip and sinus pressure.   Eyes: Negative for redness and itching.  Respiratory: Positive for cough and shortness of breath. Negative for chest tightness and wheezing.   Cardiovascular: Negative for palpitations and leg swelling.  Gastrointestinal: Negative for nausea and vomiting.  Genitourinary: Negative for dysuria.  Musculoskeletal: Negative for joint swelling.  Skin: Negative for rash.  Neurological: Negative for headaches.  Hematological: Does not bruise/bleed easily.  Psychiatric/Behavioral: Negative for dysphoric mood. The patient is not nervous/anxious.        Objective:   Physical Exam GEN: A/Ox3; pleasant , NAD, thin frail  - SPEAKING WITH SQUEAKY  VOICE + and this is IMPROVED 09/23/2012 d  HEENT:  [place]/AT,  EACs-clear, TMs-wnl, NOSE-clear, THROAT-clear,  no lesions, postnasal drip present which was absent at last visit   NECK:  Supple w/ fair ROM; no JVD; normal carotid impulses w/o bruits; no thyromegaly or nodules palpated; no lymphadenopathy.  RESP  Coarse BS , diminished bS in bases no accessory muscle use, no dullness to percussion  CARD:  RRR, no m/r/g  , no peripheral edema, pulses intact, no cyanosis or clubbing.  GI:   Soft & nt; nml bowel sounds; no organomegaly or masses detected.  Musco: Warm bil, no deformities or joint swelling noted.   Neuro: alert, no focal deficits noted.    Skin: Warm, along left hip area dry skin in lumbar area. No evidence of shingles            Assessment & Plan:

## 2012-09-25 ENCOUNTER — Telehealth: Payer: Self-pay | Admitting: Internal Medicine

## 2012-09-25 DIAGNOSIS — R339 Retention of urine, unspecified: Secondary | ICD-10-CM | POA: Insufficient documentation

## 2012-09-25 DIAGNOSIS — J3489 Other specified disorders of nose and nasal sinuses: Secondary | ICD-10-CM | POA: Insufficient documentation

## 2012-09-25 DIAGNOSIS — R3911 Hesitancy of micturition: Secondary | ICD-10-CM

## 2012-09-25 NOTE — Assessment & Plan Note (Signed)
#  Hoarse voice - likely due to post nasal drip, copd and possibly acid reflux and inhalers  - monitor this given negative ENT workup but aggressively address sinus drainage issues   #Followup  4 months or sooner if needed If not getting better, call or come sooner COPD CAT score at followup

## 2012-09-25 NOTE — Assessment & Plan Note (Signed)
#  COPD  - currently stable.  - continue your inhalers spiriva and advair but could change spiriva to tudorza based on my discussion with urologist  -Continue start roflumilast  -

## 2012-09-25 NOTE — Telephone Encounter (Signed)
Can you find out who is urologist is please and leave message with that urologist . I need to talk aobut spiriva and urinary hesitancy

## 2012-09-25 NOTE — Assessment & Plan Note (Signed)
#  Urinary retention  - I am going to talk to your urologist about the role of spiriva n this problem

## 2012-09-25 NOTE — Assessment & Plan Note (Signed)
#  Cough and sinus drainage  -= Please start Nettie pot saline wash and inhaled nasal steroid: Nurse will ensure adequate refills

## 2012-09-25 NOTE — Telephone Encounter (Signed)
Yes, it could be due to spiriva but with his age he could have underlying prostate issue. Please do ref

## 2012-09-25 NOTE — Telephone Encounter (Signed)
Pt states he does not have a urologists. He states when he was having issues he went to an urgent care for treatment but did not follow-up again. Do you want to make a referral to urologists? Carron Curie, CMA

## 2012-10-03 NOTE — Telephone Encounter (Signed)
A referral was already placed and pt has an appt on 10-22-12. Carron Curie, CMA

## 2012-10-23 ENCOUNTER — Telehealth: Payer: Self-pay | Admitting: Internal Medicine

## 2012-10-23 DIAGNOSIS — J449 Chronic obstructive pulmonary disease, unspecified: Secondary | ICD-10-CM

## 2012-10-23 DIAGNOSIS — J441 Chronic obstructive pulmonary disease with (acute) exacerbation: Secondary | ICD-10-CM

## 2012-10-23 NOTE — Telephone Encounter (Signed)
I spoke with pt and he is requesting an order for portable oxygen. He stated his current tanks are too heavy. Please advise MR thanks

## 2012-10-25 NOTE — Telephone Encounter (Signed)
Order has been placed. Carron Curie, CMA

## 2012-11-10 ENCOUNTER — Telehealth: Payer: Self-pay | Admitting: Internal Medicine

## 2012-11-10 NOTE — Telephone Encounter (Signed)
Got a request from Ysidro Evert at [facility] fax number [phone] asking for UA and PSA results for this patient who I referred for PSA. Please call them phone [phone] and tell them that we do not have this info and they have to contact patient PCP of which none listedNo primary provider on file.   Dr. Kalman Shan, M.D., [facility].C.P Pulmonary and Critical Care Medicine Staff Physician [place] [facility] [place] Pulmonary and Critical [facility] Pager: [phone], If no answer or between  15:00h - 7:00h: call [phone]  11/10/2012 10:33 PM

## 2012-11-11 NOTE — Telephone Encounter (Signed)
Spoke to a rep in the medical records department at [facility]. They are aware that we do not have the records they are needing and will need to contact his PCP.

## 2013-01-22 ENCOUNTER — Ambulatory Visit (INDEPENDENT_AMBULATORY_CARE_PROVIDER_SITE_OTHER): Payer: Medicare Other | Admitting: Internal Medicine

## 2013-01-22 ENCOUNTER — Encounter: Payer: Self-pay | Admitting: Internal Medicine

## 2013-01-22 VITALS — BP 92/60 | HR 86 | Ht 67.0 in | Wt 109.6 lb

## 2013-01-22 DIAGNOSIS — R911 Solitary pulmonary nodule: Secondary | ICD-10-CM

## 2013-01-22 DIAGNOSIS — J449 Chronic obstructive pulmonary disease, unspecified: Secondary | ICD-10-CM

## 2013-01-22 DIAGNOSIS — R918 Other nonspecific abnormal finding of lung field: Secondary | ICD-10-CM

## 2013-01-22 DIAGNOSIS — R638 Other symptoms and signs concerning food and fluid intake: Secondary | ICD-10-CM

## 2013-01-22 MED ORDER — DOXYCYCLINE HYCLATE 100 MG PO TABS: 100.0000 mg | ORAL_TABLET | Freq: Two times a day (BID) | ORAL | Status: DC

## 2013-01-22 MED ORDER — PREDNISONE 10 MG PO TABS: ORAL_TABLET | ORAL | Status: DC

## 2013-01-22 NOTE — Progress Notes (Signed)
Subjective:    Patient ID: Frank Garcia, male    DOB: 07/09/1929, 79 y.o.   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 Overall COPD cat score has gotten worse  - Body mass index is 18.17 kg/(m^2). on 11/13/2011 (weight 116# and up 5# since June 2012)  - Body mass index is 18.54 kg/(m^2). on 01/23/2012 - Body mass index is 17.85 kg/(m^2). on 09/23/2012 - Body mass index is 17.16 kg/(m^2). on 01/22/2013   #Shingles - MArch 2013     #Recurrent COPD exacerbation - March 2013: Office treatment for COPD exacerbation - May 2013: Office treatment with doxycycline and prednisone for COPD exacerbation  - August 2030: Started roflumilast  #hoarse voice - ENT evaluation and 2013 normal     OV 01/22/2013 Routine followup for COPD. He is reporting for the past one month increased cough and increased white sputum production and volume. No change in sputum color. No sustained worsening of dyspnea or chest tightness although the COPD cat score in system he appears to be a little bit more limited than baseline and little more fatigued than baseline and is coughing more than baseline. Severity cat score is 23 and worse than prior score of 17. He reports compliance with medications    Of note, last CT scan of the chest was in April 2013 showed stability of pulmonary nodule in the right upper lobe. Also, he is continuing to lose weight; he is taking and show   CAT COPD Symptom and Quality of Life Score (glaxo smith  kline trademark)   0 (no burden) to 5 (highest burden) May 2013 04/05/2012  09/23/2012  01/22/2013   Never Cough -> Cough all the time 3 3 2 3   No phlegm in chest -> Chest is full of phlegm 3 3 3 3   No chest tightness -> Chest feels very tight 1 1 0 1  No dyspnea for 1 flight stairs/hill -> Very dyspneic for 1 flight of stairs 4 4 5 4   No limitations for ADL at home -> Very limited with ADL at home 2 3 3 4   Confident leaving home -> Not at all confident leaving home 1 1 0 1  Sleep soundly -> Do not sleep soundly because of lung condition 4 2 2 3   Lots of Energy -> No energy at all 3 4 2 4   TOTAL Score (max 40)  21 21 17 23    Past, Family, Social reviewed: no change since last visit    Review of Systems  Constitutional: Negative for fever and unexpected weight change.  HENT: Negative for ear pain, nosebleeds, congestion, sore throat, rhinorrhea, sneezing, trouble swallowing, dental problem, postnasal drip and sinus pressure.   Eyes: Negative for redness and itching.  Respiratory: Negative for cough, chest tightness, shortness of breath and wheezing.   Cardiovascular: Negative for palpitations and leg swelling.  Gastrointestinal: Negative for nausea and vomiting.  Genitourinary: Negative for dysuria.  Musculoskeletal: Negative for joint swelling.  Skin: Negative for rash.  Neurological: Negative for headaches.  Hematological: Does not bruise/bleed easily.  Psychiatric/Behavioral: Negative for dysphoric mood. The patient is not nervous/anxious.       Current outpatient prescriptions:albuterol (PROVENTIL HFA) 108 (90 BASE) MCG/ACT inhaler, Inhale 2 puffs into the lungs every 6 (six) hours as needed. For shortness of breath, Disp: , Rfl: ;  fluticasone (FLONASE) 50 MCG/ACT nasal spray, Place 2 sprays into the nose daily., Disp: 16 g, Rfl: 2;  Fluticasone-Salmeterol (ADVAIR DISKUS) 100-50 MCG/DOSE AEPB, Inhale 1 puff into the lungs 2 (two) times daily., Disp: 180 each, Rfl: 3 Nutritional  Supplements (ENSURE HIGH PROTEIN PO), Take 1 Can by mouth daily. , Disp: , Rfl: ;  RAPAFLO 8 MG CAPS capsule, Take 1 capsule by mouth daily., Disp: , Rfl: ;  roflumilast (DALIRESP) 500 MCG TABS tablet, Take 1 tablet (500 mcg total) by mouth daily., Disp: 90 tablet, Rfl: 3;  SPIRIVA HANDIHALER 18 MCG inhalation capsule, PLACE 1 CAPSULE (18 MCG TOTAL) INTO INHALER AND INHALE DAILY., Disp: 30 each, Rfl: 6  Objective:   Physical Exam GEN: A/Ox3; pleasant , NAD, thin frail  - SPEAKING WITH SQUEAKY  VOICE + and this is IMPROVED 09/23/2012 Body mass index is 17.16 kg/(m^2).  d  HEENT:  [place]/AT,  EACs-clear, TMs-wnl, NOSE-clear, THROAT-clear, no lesions, postnasal drip present which was absent at last visit   NECK:  Supple w/ fair ROM; no JVD; normal carotid impulses w/o bruits; no thyromegaly or nodules palpated; no lymphadenopathy.  RESP  Coarse BS , diminished bS in bases no accessory muscle use, no dullness to percussion  CARD:  RRR, no m/r/g  , no peripheral edema, pulses intact, no cyanosis or clubbing.  GI:   Soft & nt; nml bowel sounds; no organomegaly or masses detected.  Musco: Warm bil, no deformities or joint swelling noted.   Neuro: alert, no focal deficits noted.    Skin: Warm, along left hip area dry skin in lumbar area. No evidence of shingles              Assessment & Plan:

## 2013-01-22 NOTE — Patient Instructions (Addendum)
#  Worsening cough - This could be a mild flare up of COPD - Take doxycycline 100mg  po twice daily x 5 days; take after meals and avoid sunlight - Prednisone 40 mg x1 for one-day then 30 mg x1 for one-day then 20 mg x1 for one-day then 10 mg x1 for one-day and then 5 mg x1 for one-day and stop  #Lung nodule and weight loss - Do CT scan of the chest in July 2014 for followup  #Followup - 4 months with COPD cat score could be sooner depending on CT scan results

## 2013-01-23 NOTE — Assessment & Plan Note (Signed)
Appears to be mild exacerbation. His lipid take prolonged steroids beyond a week. Now treated with 5 days of doxycycline and 5 days of prednisone

## 2013-01-23 NOTE — Assessment & Plan Note (Signed)
o continue Ensure for now. We will look at the CT scan of the chest July 2014

## 2013-01-23 NOTE — Assessment & Plan Note (Signed)
LUL scar like opacity with associated nodules   - new 06/17/2008. PET negative 06/24/2008  - CT 03/16/2009 - improved scar, LUL nodules resolved - CT April 2013: Stable  #RUL nodule 4mm  - new on CT 03/16/2009  - stabe on CT Aug 2011 - Stable on CT April 2013  Plan - Repeat surveillance CT chest in July 2014

## 2013-02-13 ENCOUNTER — Telehealth: Payer: Self-pay | Admitting: Internal Medicine

## 2013-02-13 NOTE — Telephone Encounter (Signed)
Called and spoke with pt and he stated that he noticed his handicap placard expired yesterday.  He was requesting MR to do a letter so he could get this placard again.  i advised him that this is a form that needs to be signed by MR.   Pt is aware that will forward this message to MR to make him aware that the form needs to be completed and pt wanted this form mailed to him.

## 2013-02-19 NOTE — Telephone Encounter (Signed)
Please advise if this has been taken care of. Thanks.  

## 2013-02-20 NOTE — Telephone Encounter (Signed)
Placard has been completed and placed at front for the pt. [email or web address] to advise the pt. Carron Curie, CMA

## 2013-02-20 NOTE — Telephone Encounter (Signed)
Pleasea dvise if this has been taken care of, thank you!

## 2013-02-21 NOTE — Telephone Encounter (Signed)
Spoke with patient, Patient aware palcard form is ready to pick up up front States he will pick up today Nothing further needed at this time

## 2013-02-25 NOTE — Telephone Encounter (Signed)
noted 

## 2013-03-06 ENCOUNTER — Other Ambulatory Visit: Payer: Self-pay | Admitting: Internal Medicine

## 2013-03-10 ENCOUNTER — Other Ambulatory Visit: Payer: Self-pay | Admitting: Internal Medicine

## 2013-03-18 ENCOUNTER — Telehealth: Payer: Self-pay | Admitting: Internal Medicine

## 2013-03-18 ENCOUNTER — Other Ambulatory Visit: Payer: Self-pay | Admitting: Internal Medicine

## 2013-03-18 MED ORDER — TIOTROPIUM BROMIDE MONOHYDRATE 18 MCG IN CAPS: 18.0000 ug | ORAL_CAPSULE | Freq: Every day | RESPIRATORY_TRACT | Status: DC

## 2013-03-18 NOTE — Telephone Encounter (Signed)
Spoke w rep at [facility]-- More cost effective for patient to have 90 day rx for spiriva in stead of 30 day Old rx for 30 day d/c'd and new 90 day rx ordered. Nothing further needed at this time

## 2013-03-24 ENCOUNTER — Ambulatory Visit (INDEPENDENT_AMBULATORY_CARE_PROVIDER_SITE_OTHER)
Admission: RE | Admit: 2013-03-24 | Discharge: 2013-03-24 | Disposition: A | Discharge status: Home / Self Care | Payer: Medicare Other | Source: Ambulatory Visit | Attending: Internal Medicine | Admitting: Internal Medicine

## 2013-03-24 DIAGNOSIS — R911 Solitary pulmonary nodule: Secondary | ICD-10-CM

## 2013-04-06 ENCOUNTER — Telehealth: Payer: Self-pay | Admitting: Internal Medicine

## 2013-04-06 NOTE — Telephone Encounter (Signed)
Reviewed ct chest from 03/24/13 and when compared to 2013 and 2012 the RUL apical scar tissue like density is more promient now and measures 1.6cm. This was not there in 2009 and 2010/2011.     Have him come in to discuss.  Above  Thanks  Dr. Kalman Shan, M.D., [facility].C.P Pulmonary and Critical Care Medicine Staff Physician [place] [facility] [place] Pulmonary and Critical Care Pager: [phone], If no answer or between  15:00h - 7:00h: call [phone]  04/06/2013 8:30 PM       Likely lung cancer. Will need oncimmune and indi blood tests,

## 2013-04-09 NOTE — Telephone Encounter (Signed)
[email or web address].Levie Wages, CMA  

## 2013-04-10 NOTE — Telephone Encounter (Signed)
Spoke with pt -  We have scheduled pt to see MR on Thursday, Aug 28 at 2:15 pm to discuss results.  (MR out of office next week; Aug 28 is first available).  Pt aware.

## 2013-04-24 ENCOUNTER — Encounter: Payer: Self-pay | Admitting: *Deleted

## 2013-04-24 ENCOUNTER — Encounter: Payer: Self-pay | Admitting: Internal Medicine

## 2013-04-24 ENCOUNTER — Ambulatory Visit (INDEPENDENT_AMBULATORY_CARE_PROVIDER_SITE_OTHER): Payer: Medicare Other | Admitting: Internal Medicine

## 2013-04-24 VITALS — BP 102/62 | HR 85 | Temp 98.2°F | Ht 67.0 in | Wt 109.6 lb

## 2013-04-24 DIAGNOSIS — J449 Chronic obstructive pulmonary disease, unspecified: Secondary | ICD-10-CM

## 2013-04-24 DIAGNOSIS — R918 Other nonspecific abnormal finding of lung field: Secondary | ICD-10-CM

## 2013-04-24 NOTE — Progress Notes (Signed)
Information for Indi sent per Dr. Jane Canary request.

## 2013-04-24 NOTE — Progress Notes (Signed)
Subjective:    Patient ID: Frank Garcia, male    DOB: 07-14-1929, 79 y.o.   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 Overall COPD cat score has gotten worse  - Body mass index is 18.17 kg/(m^2). on 11/13/2011 (weight 116# and up 5# since June 2012)  - Body mass index is 18.54 kg/(m^2). on 01/23/2012 - Body mass index is 17.85 kg/(m^2). on 09/23/2012 - Body mass index is 17.16 kg/(m^2). on 01/22/2013 Body mass index is 17.16 kg/(m^2). on 04/24/2013     #Shingles - MArch 2013     #Recurrent COPD exacerbation - March 2013: Office treatment for COPD exacerbation - May 2013: Office treatment with doxycycline and prednisone for COPD exacerbation  - August 2030: Started roflumilast May 2-14 - office RX Possible COPDAE./ Rx doxy and pred  #hoarse voice - ENT evaluation and 2013 normal    OV 04/24/2013  FU  CAchexia: weight loss stopped but no weight gain. WEight stable though. Wants to know how to do better.  Lung nodule: Reviewed ct chest from 03/24/13 and when compared to 2013 and 2012 the RUL apical scar tissue like density is more promient now and measures 1.6cm. This was not there in 2009 and 2010/2011.   CPOugh/COPD: After Rx of AECOPD presumed last OV sympotoms improved and cough and everything is back to baseline. COPD CAT SCore is 16. He is comppliant with mdi. No new issues   CAT COPD Symptom and Quality of Life  Score (glaxo smith kline trademark)   0 (no burden) to 5 (highest burden) May 2013 04/05/2012  09/23/2012  01/22/2013 Likely aecopd 04/24/2013   Never Cough -> Cough all the time 3 3 2 3 2   No phlegm in chest -> Chest is full of phlegm 3 3 3 3 2   No chest tightness -> Chest feels very tight 1 1 0 1 1  No dyspnea for 1 flight stairs/hill -> Very dyspneic for 1 flight of stairs 4 4 5 4 4   No limitations for ADL at home -> Very limited with ADL at home 2 3 3 4 3   Confident leaving home -> Not at all confident leaving home 1 1 0 1 1  Sleep soundly -> Do not sleep soundly because of lung condition 4 2 2 3 2   Lots of Energy -> No energy at all 3 4 2 4 1   TOTAL Score (max 40)  21 21 17 23 16      Review of Systems  Constitutional: Negative for fever and unexpected weight change.  HENT: Negative for ear pain, nosebleeds, congestion, sore throat, rhinorrhea, sneezing, trouble swallowing, dental problem, postnasal drip and sinus pressure.   Eyes: Negative for redness and itching.  Respiratory: Positive for shortness of breath. Negative for cough, chest tightness and wheezing.   Cardiovascular: Negative for palpitations and leg  swelling.  Gastrointestinal: Negative for nausea and vomiting.  Genitourinary: Negative for dysuria.  Musculoskeletal: Negative for joint swelling.  Skin: Negative for rash.  Neurological: Negative for headaches.  Hematological: Does not bruise/bleed easily.  Psychiatric/Behavioral: Negative for dysphoric mood. The patient is not nervous/anxious.    Current outpatient prescriptions:ADVAIR DISKUS 100-50 MCG/DOSE AEPB, INHALE 1 PUFF INTO THE LUNGS 2 (TWO) TIMES DAILY., Disp: 180 each, Rfl: 3;  albuterol (PROVENTIL HFA) 108 (90 BASE) MCG/ACT inhaler, Inhale 2 puffs into the lungs every 6 (six) hours as needed. For shortness of breath, Disp: , Rfl: ;  DALIRESP 500 MCG TABS tablet, TAKE 1 TABLET (500 MCG TOTAL) BY MOUTH DAILY., Disp: 30 tablet, Rfl: 2 Nutritional Supplements  (ENSURE HIGH PROTEIN PO), Take 1 Can by mouth daily. , Disp: , Rfl: ;  RAPAFLO 8 MG CAPS capsule, Take 1 capsule by mouth daily., Disp: , Rfl: ;  roflumilast (DALIRESP) 500 MCG TABS tablet, Take 1 tablet (500 mcg total) by mouth daily., Disp: 90 tablet, Rfl: 3;  tiotropium (SPIRIVA HANDIHALER) 18 MCG inhalation capsule, Place 1 capsule (18 mcg total) into inhaler and inhale daily., Disp: 90 capsule, Rfl: 3     Objective:   Physical Exam GEN: A/Ox3; pleasant , NAD, thin frail  - Body mass index is 17.16 kg/(m^2). Body mass index is 17.16 kg/(m^2).   d  HEENT:  [place]/AT,  EACs-clear, TMs-wnl, NOSE-clear, THROAT-clear, no lesions, postnasal drip present which was absent at last visit   NECK:  Supple w/ fair ROM; no JVD; normal carotid impulses w/o bruits; no thyromegaly or nodules palpated; no lymphadenopathy.  RESP  Coarse BS , diminished bS in bases no accessory muscle use, no dullness to percussion  CARD:  RRR, no m/r/g  , no peripheral edema, pulses intact, no cyanosis or clubbing.  GI:   Soft & nt; nml bowel sounds; no organomegaly or masses detected.  Musco: Warm bil, no deformities or joint swelling noted.   Neuro: alert, no focal deficits noted.    Skin: Warm, along left hip area dry skin in lumbar area. No evidence of shingles            Assessment & Plan:

## 2013-04-24 NOTE — Patient Instructions (Addendum)
#  COPD - Stable disease - Continue your inhalers as  before - Make sure you have flu shot in the fall; I prefer that you have high dose flu shot if this is available  #Lung nodule - There is a slow growth in the lung nodule overfew years. Need to figure out if lung cancer or not - Let us see if we can make a diagnosis or lean towards a diagnosis with blood test - Do onc-immune blood test - possibly today  - Also do XPRESYS blood test - you will get a call about this test later - Call you with result next few weeks  #FOllwoup 6 weeks to discuss test results

## 2013-04-26 NOTE — Assessment & Plan Note (Signed)
#  Lung nodule - There is a slow growth in the lung nodule overfew years. Need to figure out if lung cancer or not - Let us see if we can make a diagnosis or lean towards a diagnosis with blood test - Do onc-immune blood test - possibly today  - Also do XPRESYS blood test - you will get a call about this test later - Call you with result next few weeks  #FOllwoup 6 weeks to discuss test results

## 2013-04-26 NOTE — Assessment & Plan Note (Signed)
#  COPD - Stable disease - Continue your inhalers as  before - Make sure you have flu shot in the fall; I prefer that you have high dose flu shot if this is available

## 2013-04-29 ENCOUNTER — Telehealth: Payer: Self-pay | Admitting: Internal Medicine

## 2013-04-29 NOTE — Telephone Encounter (Signed)
I spoke with Frank Garcia. I advised her 04/24/13. Nothing further needed

## 2013-05-07 ENCOUNTER — Telehealth: Payer: Self-pay | Admitting: Internal Medicine

## 2013-05-07 NOTE — Telephone Encounter (Signed)
onncimmne test reesult 04/29/13 is LOW level for 7 ung cancer proteins. This test is non diagnostic due to poor sensitivity and high specificity. FYI: in my limited cohort NPV is 70%  Will await INDI test  Dr. Kalman Shan, M.D., [facility].C.P Pulmonary and Critical Care Medicine Staff Physician [place] [facility] [place] [facility]monary and Critical Care Pager: [phone], If no answer or between  15:00h - 7:00h: call [phone]  05/07/2013 11:47 AM

## 2013-05-22 ENCOUNTER — Telehealth: Payer: Self-pay | Admitting: Internal Medicine

## 2013-05-22 NOTE — Telephone Encounter (Signed)
xpresys indi test done 04/29/13 is LIKELY BENGIN for nodule at 86% probability. Wil discuss with him during oct 2014 visit  Dr. Kalman Shan, M.D., [facility]C.P Pulmonary and Critical Care Medicine Staff Physician [place] [facility] [place] [facility] and Critical Care Pager: [phone], If no answer or between  15:00h - 7:00h: call [phone]  05/22/2013 4:39 PM

## 2013-06-09 ENCOUNTER — Ambulatory Visit (INDEPENDENT_AMBULATORY_CARE_PROVIDER_SITE_OTHER): Payer: Medicare Other | Admitting: Internal Medicine

## 2013-06-09 ENCOUNTER — Encounter: Payer: Self-pay | Admitting: Internal Medicine

## 2013-06-09 VITALS — BP 108/60 | HR 64 | Ht 67.0 in | Wt 108.2 lb

## 2013-06-09 DIAGNOSIS — R918 Other nonspecific abnormal finding of lung field: Secondary | ICD-10-CM

## 2013-06-09 DIAGNOSIS — J449 Chronic obstructive pulmonary disease, unspecified: Secondary | ICD-10-CM

## 2013-06-09 DIAGNOSIS — Z23 Encounter for immunization: Secondary | ICD-10-CM

## 2013-06-09 DIAGNOSIS — R911 Solitary pulmonary nodule: Secondary | ICD-10-CM

## 2013-06-09 NOTE — Progress Notes (Signed)
Subjective:    Patient ID: Frank Garcia, male    DOB: [birth year], 79 y.o.   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 Overall COPD cat score has gotten worse  - Body mass index is 18.17 kg/(m^2). on 11/13/2011 (weight 116# and up 5# since June 2012)  - - Body mass index is 16.94 kg/(m^2). on 06/09/2013   #Shingles - MArch 2013     #Recurrent COPD exacerbation - March 2013: Office treatment for COPD exacerbation - May 2013: Office treatment with doxycycline and prednisone for COPD exacerbation  - August 2030: Started roflumilast May 2-14 - office RX Possible COPDAE./ Rx doxy and pred  #hoarse voice - ENT evaluation and 2013 normal    OV 04/24/2013  FU  CAchexia: weight loss stopped but no weight gain. WEight stable though. Wants to know how to do better.  Lung nodule: Reviewed ct chest from 03/24/13 and when compared to 2013 and 2012 the RUL apical scar tissue like density is more promient now and measures 1.6cm. This was not there in 2009 and 2010/2011.   CPOugh/COPD: After Rx of AECOPD presumed last OV sympotoms improved and cough and everything is back to baseline. COPD CAT SCore is 16. He is comppliant with mdi. No new issues   REC #COPD  - Stable disease  - Continue your inhalers as before  - Make sure you have flu shot in the fall; I prefer that you have high dose flu shot if this is available  #Lung nodule  -  There is a slow growth in the lung nodule overfew years. Need to figure out if lung cancer or not  - Let us see if we can make a diagnosis or lean towards a diagnosis with blood test  - Do onc-immune blood test - possibly today  - Also do XPRESYS blood test - you will get a call about this test later  - Call you with result next few weeks  #FOllwoup  6 weeks to discuss test results   OV 06/09/2013  Discuss lung noodule and COPD followup  Lung nodule RUL (more scar like): Onncimmne test reesult 04/29/13 is LOW level for 7 ung cancer proteins. This test is non diagnostic due to poor sensitivity and high specificity. FYI: in my limited cohort NPV is 70%. Xpresys indi test done 04/29/13 is LIKELY BENGIN for nodule at 86% probability. Overall unclear if tests are helpful.   COPD: stable. Compliant with mdi. HE does not thik is as good as last year but stable dz  WEight loss: Again losing weight. But he feels whey is good for him. Appetite is diminished with time    CAT COPD Symptom and Quality of Life Score (glaxo smith kline trademark)   0 (no burden) to 5 (highest burden) May 2013 04/05/2012  09/23/2012  01/22/2013 Likely aecopd  04/24/2013   Never Cough -> Cough all the time 3 3 2 3 2   No phlegm in chest -> Chest is full of phlegm 3 3 3 3 2   No chest tightness -> Chest feels very tight 1 1 0 1 1  No dyspnea for 1 flight stairs/hill -> Very dyspneic for 1 flight of stairs 4 4 5 4 4   No limitations for ADL at home -> Very limited with ADL at home 2 3 3 4 3   Confident leaving home -> Not at all confident leaving home 1 1 0 1 1  Sleep soundly -> Do not sleep soundly because of lung condition 4 2 2 3 2   Lots of Energy -> No energy at all 3 4 2 4 1   TOTAL Score (max 40)  21 21 17 23 16         Review of Systems  Constitutional: Negative for fever and unexpected weight change.  HENT: Negative for congestion, dental problem, ear pain, nosebleeds, postnasal drip, rhinorrhea, sinus pressure,  sneezing, sore throat and trouble swallowing.   Eyes: Negative for redness and itching.  Respiratory: Negative for cough, chest tightness, shortness of breath and wheezing.   Cardiovascular: Negative for palpitations and leg swelling.  Gastrointestinal: Negative for nausea and vomiting.  Genitourinary: Negative for dysuria.  Musculoskeletal: Negative for joint swelling.  Skin: Negative for rash.  Neurological: Negative for headaches.  Hematological: Does not bruise/bleed easily.  Psychiatric/Behavioral: Negative for dysphoric mood. The patient is not nervous/anxious.        Objective:   Physical Exam  Filed Vitals:   06/09/13 1053  Height: 5\' 7"  (1.702 m)  Weight: 108 lb 3.2 oz (49.079 kg)   Filed Vitals:   06/09/13 1053  BP: 108/60  Pulse: 64  Height: 5\' 7"  (1.702 m)  Weight: 108 lb 3.2 oz (49.079 kg)  SpO2: 95%    GEN: A/Ox3; pleasant ,  d  HEENT:  [place]/AT,  EACs-clear, TMs-wnl, NOSE-clear, THROAT-clear, no lesions, postnasal drip present which was absent at last visit   NECK:  Supple w/ fair ROM; no JVD; normal carotid impulses w/o bruits; no thyromegaly or nodules palpated; no lymphadenopathy.  RESP  Coarse BS , diminished bS in bases no accessory muscle use, no dullness to percussion  CARD:  RRR, no m/r/g  , no peripheral edema, pulses intact, no cyanosis or clubbing.  GI:   Soft & nt; nml bowel sounds; no organomegaly or masses detected.  Musco: Warm bil, no deformities or joint swelling noted.   Neuro: alert, no focal deficits noted.    Skin: Warm, along left hip area dry skin in lumbar area. No evidence of shingles            Assessment & Plan:

## 2013-06-09 NOTE — Patient Instructions (Addendum)
#  Lung nodule  - Do CT chest next 2 weeks - IF nodule still present, will do PET scan  #COpd -= stable, continue inhalers  -flu shot today  #Followup - based on results of CT scan

## 2013-06-10 ENCOUNTER — Other Ambulatory Visit: Payer: Self-pay | Admitting: Internal Medicine

## 2013-06-12 ENCOUNTER — Ambulatory Visit (INDEPENDENT_AMBULATORY_CARE_PROVIDER_SITE_OTHER)
Admission: RE | Admit: 2013-06-12 | Discharge: 2013-06-12 | Disposition: A | Discharge status: Home / Self Care | Payer: Medicare Other | Source: Ambulatory Visit | Attending: Internal Medicine | Admitting: Internal Medicine

## 2013-06-12 DIAGNOSIS — R911 Solitary pulmonary nodule: Secondary | ICD-10-CM

## 2013-06-15 ENCOUNTER — Telehealth: Payer: Self-pay | Admitting: Internal Medicine

## 2013-06-15 DIAGNOSIS — I2510 Atherosclerotic heart disease of native coronary artery without angina pectoris: Secondary | ICD-10-CM

## 2013-06-15 DIAGNOSIS — R911 Solitary pulmonary nodule: Secondary | ICD-10-CM

## 2013-06-15 NOTE — Assessment & Plan Note (Signed)
#  Lung nodule  - Do CT chest next 2 weeks - IF nodule still present, will do PET scan

## 2013-06-15 NOTE — Assessment & Plan Note (Signed)
#  Lung nodule  - Do CT chest next 2 weeks - IF nodule still present, will do PET scan 

## 2013-06-15 NOTE — Telephone Encounter (Signed)
Let him know CT chest oct 2014 shows decrease in lung noudle RUL; this fits in iwht chronic inflamation consistent iwht blood test suggesting this is benign. Good news but nout of woods - tell him that. Please order CT chest wo contrast in 3 months and fyu with me then  Also, he has co artery calcification - aske him if he has hever seen cardiology or ever had heart stress test ? If not, refer him to one  Dr. Kalman Shan, M.D., [facility].C.P Pulmonary and Critical Care Medicine Staff Physician [facility] [place] Pulmonary and Critical Care Pager: [phone], If no answer or between  15:00h - 7:00h: call [phone]  06/15/2013 10:00 PM

## 2013-06-15 NOTE — Assessment & Plan Note (Signed)
 #  COpd -= stable, continue inhalers  -flu shot today  #Followup - based on results of CT scan

## 2013-06-16 ENCOUNTER — Telehealth: Payer: Self-pay | Admitting: Internal Medicine

## 2013-06-16 MED ORDER — PREDNISONE 10 MG PO TABS: ORAL_TABLET | ORAL | Status: DC

## 2013-06-16 MED ORDER — DOXYCYCLINE HYCLATE 100 MG PO TABS: 100.0000 mg | ORAL_TABLET | Freq: Two times a day (BID) | ORAL | Status: DC

## 2013-06-16 NOTE — Telephone Encounter (Signed)
Called, spoke with pt.  C/o coughing - worse since Friday.  Cough is prod with clear mucus.  Also has increased SOB with cough.  No wheezing ,chest tightness, chest pain, f/c/s, or body aches.  Has not needed to use albuterol hfa.  States prednisone has worked in the past.  Is requesting rx to help with cough.  MR, pls advise.  Thank you.  Last OV with MR: 06/09/13  nkda - verified with pt. [facility]

## 2013-06-16 NOTE — Telephone Encounter (Signed)
Take doxycycline 100mg po twice daily x 5 days; take after meals and avoid sunlight  Take prednisone 40 mg daily x 2 days, then 20mg daily x 2 days, then 10mg daily x 2 days, then 5mg daily x 2 days and stop  

## 2013-06-16 NOTE — Telephone Encounter (Signed)
Called, spoke with pt.  Informed him of below recs per MR.  He verbalized understanding of this, is aware [facility], and is to call back if symptoms do not improve or worsen and seek emergency care if needed.

## 2013-06-19 ENCOUNTER — Other Ambulatory Visit: Payer: Self-pay | Admitting: *Deleted

## 2013-06-19 DIAGNOSIS — R0982 Postnasal drip: Secondary | ICD-10-CM

## 2013-06-19 DIAGNOSIS — R490 Dysphonia: Secondary | ICD-10-CM

## 2013-06-19 DIAGNOSIS — J449 Chronic obstructive pulmonary disease, unspecified: Secondary | ICD-10-CM

## 2013-06-19 MED ORDER — ROFLUMILAST 500 MCG PO TABS: 500.0000 ug | ORAL_TABLET | Freq: Every day | ORAL | Status: DC

## 2013-06-19 NOTE — Telephone Encounter (Signed)
[email or web address].Jennifer Castillo, CMA  

## 2013-06-20 NOTE — Telephone Encounter (Signed)
Returning call can be reached at [phone].Raylene Everts'

## 2013-06-20 NOTE — Telephone Encounter (Signed)
Thanks. Just double checking the order was to see cardiologist for coronary artery calcification not for cpst test.   Dr. Kalman Shan, M.D., [facility].C.P Pulmonary and Critical Care Medicine Staff Physician [place] [facility] [place] Pulmonary and Critical Care Pager: [phone], If no answer or between  15:00h - 7:00h: call [phone]  06/20/2013 5:26 PM

## 2013-06-20 NOTE — Telephone Encounter (Signed)
Results have been explained to patient, pt expressed understanding. Patient states that he has never seen Cardiologist nor has be had CPST. Orders per MR have been placed.  Patient will await calls to schedule.   Will send to MR as FYI.

## 2013-06-23 ENCOUNTER — Telehealth: Payer: Self-pay | Admitting: *Deleted

## 2013-06-23 NOTE — Telephone Encounter (Deleted)
Message copied by Maisie Fus on Mon Jun 23, 2013 10:46 AM ------      Message from: LAW, DAWNE J      Created: Mon Jun 23, 2013  8:56 AM       Ashtyn,            Can you put in an order for the CPST on this pt so I can schedule it?            Thanks,            Dawne ------

## 2013-06-23 NOTE — Telephone Encounter (Signed)
Error

## 2013-06-27 ENCOUNTER — Encounter: Payer: Self-pay | Admitting: Internal Medicine

## 2013-06-27 ENCOUNTER — Ambulatory Visit (INDEPENDENT_AMBULATORY_CARE_PROVIDER_SITE_OTHER): Payer: Medicare Other | Admitting: Interventional Cardiology

## 2013-06-27 ENCOUNTER — Encounter: Payer: Self-pay | Admitting: Interventional Cardiology

## 2013-06-27 VITALS — BP 129/63 | HR 85 | Ht 67.0 in | Wt 110.0 lb

## 2013-06-27 DIAGNOSIS — I2510 Atherosclerotic heart disease of native coronary artery without angina pectoris: Secondary | ICD-10-CM | POA: Insufficient documentation

## 2013-06-27 DIAGNOSIS — J449 Chronic obstructive pulmonary disease, unspecified: Secondary | ICD-10-CM

## 2013-06-27 DIAGNOSIS — I2584 Coronary atherosclerosis due to calcified coronary lesion: Secondary | ICD-10-CM

## 2013-06-27 NOTE — Patient Instructions (Signed)
Your physician has requested that you have en exercise stress myoview. For further information please visit [email or web address]. Please follow instruction sheet, as given.   

## 2013-06-27 NOTE — Progress Notes (Signed)
Patient ID: Frank Garcia, male   DOB: [birth year], 79 y.o.   MRN: [id number]     Patient ID: Frank Garcia MRN: [id number] DOB/AGE: 1928/11/12 79 y.o.   Referring Physician:  Dr. Colletta [place]   Reason for Consultation Coronary calcifications  HPI: 79 y/o who had a CT scan of the chest showing some coronary calcifications.  He had some lung scar, that was not thought to be malignant.  He has not had any exertional CP.  If he is without oxygen, he will get [facility] while walking.  He quit smoking about 5 years ago.  He feels that his respiratory problems happened after he stopped smoking.  His father  Had emphysema.  He cannot think of anyone who had heart disease in the family.     Current Outpatient Prescriptions  Medication Sig Dispense Refill  . ADVAIR DISKUS 100-50 MCG/DOSE AEPB INHALE 1 PUFF INTO THE LUNGS 2 (TWO) TIMES DAILY.  180 each  3  . albuterol (PROVENTIL HFA) 108 (90 BASE) MCG/ACT inhaler Inhale 2 puffs into the lungs every 6 (six) hours as needed. For shortness of breath      . doxycycline (VIBRA-TABS) 100 MG tablet Take 1 tablet (100 mg total) by mouth 2 (two) times daily. x 5 days; take after meals and avoid sunlight  10 tablet  0  . Nutritional Supplements (ENSURE HIGH PROTEIN PO) Take 1 Can by mouth daily.       Marland Kitchen RAPAFLO 8 MG CAPS capsule Take 1 capsule by mouth daily.      . roflumilast (DALIRESP) 500 MCG TABS tablet Take 1 tablet (500 mcg total) by mouth daily.  90 tablet  3  . tiotropium (SPIRIVA HANDIHALER) 18 MCG inhalation capsule Place 1 capsule (18 mcg total) into inhaler and inhale daily.  90 capsule  3   No current facility-administered medications for this visit.   Past Medical History  Diagnosis Date  . Chronic airway obstruction, not elsewhere classified     on home O2 at night  . BPH (benign prostatic hypertrophy)   . Tobacco abuse   . Weight loss   . Shingles rash 11/13/2011    Family History  Problem Relation Age of Onset  . COPD Father     History     Social History  . Marital Status: Married    Spouse Name: N/A    Number of Children: N/A  . Years of Education: 12   Occupational History  . retired     Research officer, political party   Social History Main Topics  . Smoking status: Former Smoker -- 1.50 packs/day for 40 years    Types: Cigarettes    Quit date: 08/29/2007  . Smokeless tobacco: Not on file  . Alcohol Use: No  . Drug Use: No  . Sexual Activity: No   Other Topics Concern  . Not on file   Social History Narrative   Independent in ADLs and IADLs   Walks without assistance    Past Surgical History  Procedure Laterality Date  . No past surgeries        (Not in a hospital admission)  Review of systems complete and found to be negative unless listed above .  No nausea, vomiting.  No fever chills, No focal weakness,  No palpitations.  Physical Exam: Filed Vitals:   06/27/13 1536  BP: 129/63  Pulse: 85    Weight: 110 lb (49.896 kg)  Physical exam:  [place]/AT EOMI No JVD, No carotid bruit RRR  S1S2  No wheezing Soft. NT, nondistended No edema. No focal motor or sensory deficits Normal affect  Labs:   Lab Results  Component Value Date   WBC 5.2 11/28/2011   HGB 11.2* 11/28/2011   HCT 33.5* 11/28/2011   MCV 84.2 11/28/2011   PLT 152 11/28/2011   No results found for this basename: NA, K, CL, CO2, BUN, CREATININE, CALCIUM, LABALBU, PROT, BILITOT, ALKPHOS, ALT, AST, GLUCOSE,  in the last 168 hours Lab Results  Component Value Date   CKTOTAL 64 11/28/2011   CKMB 2.7 11/28/2011   TROPONINI <0.30 11/28/2011    Lab Results  Component Value Date   CHOL  Value: 158        ATP III CLASSIFICATION:  <200     mg/dL   Desirable  147-829  mg/dL   Borderline High  >=562    mg/dL   High        HEMOLYZED SPECIMEN, RESULTS MAY BE AFFECTED 09/05/2007   Lab Results  Component Value Date   HDL 78 09/05/2007   Lab Results  Component Value Date   LDLCALC NOT CALCULATED 09/05/2007   Lab Results  Component Value Date   TRIG <10 09/05/2007   Lab  Results  Component Value Date   CHOLHDL 2.0 09/05/2007   No results found for this basename: LDLDIRECT      Radiology: CT scan revealing calcification in the left main, circumflex and right coronary artery as well as atherosclerosis in the aorta. EKG: Normal sinus rhythm, ST segment changes laterally  ASSESSMENT AND PLAN:  1) evidence of coronary artery disease by CT scan. It is difficult to know how severe the disease is. It may not be flow-limiting. Given his age and history of smoking, it would not be surprising that he has at least  mild atherosclerosis. not be surprising that he has at least  mild atherosclerosis. Will check a lexiscan Cardiolite to evaluate for ischemia.  He does have some exertional shortness of breath which likely related to his chronic lung disease. If his symptoms stay unchanged, I would only pursue further workup with angiography if he had a high-risk stress test. Signed:   Fredric Mare, MD, [facility] 06/27/2013, 4:06 PM

## 2013-07-16 ENCOUNTER — Ambulatory Visit (HOSPITAL_COMMUNITY): Payer: Medicare Other | Attending: Interventional Cardiology | Admitting: Radiology

## 2013-07-16 VITALS — BP 153/81 | HR 81 | Ht 67.0 in | Wt 107.0 lb

## 2013-07-16 DIAGNOSIS — J449 Chronic obstructive pulmonary disease, unspecified: Secondary | ICD-10-CM | POA: Insufficient documentation

## 2013-07-16 DIAGNOSIS — I10 Essential (primary) hypertension: Secondary | ICD-10-CM | POA: Insufficient documentation

## 2013-07-16 DIAGNOSIS — J4489 Other specified chronic obstructive pulmonary disease: Secondary | ICD-10-CM | POA: Insufficient documentation

## 2013-07-16 DIAGNOSIS — R0602 Shortness of breath: Secondary | ICD-10-CM | POA: Insufficient documentation

## 2013-07-16 DIAGNOSIS — Z87891 Personal history of nicotine dependence: Secondary | ICD-10-CM | POA: Insufficient documentation

## 2013-07-16 DIAGNOSIS — R9431 Abnormal electrocardiogram [ECG] [EKG]: Secondary | ICD-10-CM | POA: Insufficient documentation

## 2013-07-16 DIAGNOSIS — I2510 Atherosclerotic heart disease of native coronary artery without angina pectoris: Secondary | ICD-10-CM | POA: Insufficient documentation

## 2013-07-16 DIAGNOSIS — R9389 Abnormal findings on diagnostic imaging of other specified body structures: Secondary | ICD-10-CM | POA: Insufficient documentation

## 2013-07-16 MED ORDER — TECHNETIUM TC 99M SESTAMIBI GENERIC - CARDIOLITE
30.0000 | Freq: Once | INTRAVENOUS | Status: AC | PRN
  Administered 2013-07-16: 30 via INTRAVENOUS

## 2013-07-16 MED ORDER — TECHNETIUM TC 99M SESTAMIBI GENERIC - CARDIOLITE
10.0000 | Freq: Once | INTRAVENOUS | Status: AC | PRN
  Administered 2013-07-16: 10 via INTRAVENOUS

## 2013-07-16 MED ORDER — REGADENOSON 0.4 MG/5ML IV SOLN
0.4000 mg | Freq: Once | INTRAVENOUS | Status: AC
  Administered 2013-07-16: 0.4 mg via INTRAVENOUS

## 2013-07-16 NOTE — Progress Notes (Signed)
[facility] SITE 3 NUCLEAR MED [address] [place], [place] [address] [phone]    Cardiology Nuclear Med Study  Frank Garcia is a 79 y.o. male     MRN : [id number]     DOB: [birth year]  Procedure Date: 07/16/2013  Nuclear Med Background Indication for Stress Test:  Evaluation for Ischemia, Abnormal EKG, and 06-20-13 Abnormal CT: Coronary Calcifications History:  CAD, Echo 4/13 EF 50-55%, Cardiac CT (positive), severe COPD Cardiac Risk Factors: History of Smoking and Hypertension  Symptoms:  SOB   Nuclear Pre-Procedure Caffeine/Decaff Intake:  None > 12 hrs NPO After: 7:00pm   Lungs:  clear O2 Sat: 96% on 3L O2 IV 0.9% NS with Angio Cath:  22g  IV Site: R Hand x 1, tolerated well IV Started by:  Irean Hong, RN  Chest Size (in):  38 Cup Size: n/a  Height: 5\' 7"  (1.702 m)  Weight:  107 lb (48.535 kg)  BMI:  Body mass index is 16.75 kg/(m^2). Tech Comments:  Took am medications    Nuclear Med Study 1 or 2 day study: 1 day  Stress Test Type:  Lexiscan  Reading MD: Lance Muss, MD  Order Authorizing Provider:  Dr. Lance Muss, MD  Resting Radionuclide: Technetium 9m Sestamibi  Resting Radionuclide Dose: 11.0 mCi   Stress Radionuclide:  Technetium 27m Sestamibi  Stress Radionuclide Dose: 33.0 mCi           Stress Protocol Rest HR: 81 Stress HR: 96  Rest BP: 153/81 Stress BP: 166/66  Exercise Time (min): n/a METS: n/a   Predicted Max HR: 138 bpm % Max HR: 69.57 bpm Rate Pressure Product: 21308   Dose of Adenosine (mg):  n/a Dose of Lexiscan: 0.4 mg  Dose of Atropine (mg): n/a Dose of Dobutamine: n/a mcg/kg/min (at max HR)  Stress Test Technologist: Nelson Chimes, BS-ES  Nuclear Technologist:  Domenic Polite, CNMT     Rest Procedure:  Myocardial perfusion imaging was performed at rest 45 minutes following the intravenous administration of Technetium 56m Sestamibi. Rest ECG: NSR; possible septal infarct.  Stress Procedure:  The patient  received IV Lexiscan 0.4 mg over 15-seconds.  Technetium 60m Sestamibi injected at 30-seconds.  Quantitative spect images were obtained after a 45 minute delay.  During the infusion of Lexiscan, patient complained of SOB.  This began to ease in recovery.  Stress ECG: No significant change from baseline ECG  QPS Raw Data Images:  Normal; no motion artifact; normal heart/lung ratio. Stress Images:  There is decreased uptake in the apex. Rest Images:  There is decreased uptake in the apex.Decreased uptake in the inferior wall. Subtraction (SDS):  No evidence of ischemia. Transient Ischemic Dilatation (Normal <1.22):  1.06 Lung/Heart Ratio (Normal <0.45):  0.24  Quantitative Gated Spect Images QGS EDV:  59 ml QGS ESV:  19 ml  Impression Exercise Capacity:  Lexiscan with no exercise. BP Response:  Normal blood pressure response. Clinical Symptoms:  There is dyspnea. ECG Impression:  No significant ST segment change suggestive of ischemia. Comparison with Prior Nuclear Study: No images to compare  Overall Impression:  Low risk stress nuclear study .  No clear evidence of ischemia..  LV Ejection Fraction: 68%.  LV Wall Motion:  NL LV Function; NL Wall Motion  Corky Crafts., MD, [facility]

## 2013-07-21 NOTE — Progress Notes (Signed)
Normal Study?

## 2013-07-29 ENCOUNTER — Telehealth: Payer: Self-pay | Admitting: Internal Medicine

## 2013-07-29 NOTE — Telephone Encounter (Signed)
Spoke with the pt He is c/o increased DOE and nasal congestion- esp at night x 3 days OV with MW at 11:15 am tomorrow

## 2013-07-30 ENCOUNTER — Ambulatory Visit (INDEPENDENT_AMBULATORY_CARE_PROVIDER_SITE_OTHER): Payer: Medicare Other | Admitting: Internal Medicine

## 2013-07-30 ENCOUNTER — Encounter: Payer: Self-pay | Admitting: Internal Medicine

## 2013-07-30 VITALS — BP 130/60 | HR 84 | Temp 97.4°F | Ht 67.0 in | Wt 113.0 lb

## 2013-07-30 DIAGNOSIS — J449 Chronic obstructive pulmonary disease, unspecified: Secondary | ICD-10-CM

## 2013-07-30 DIAGNOSIS — R3911 Hesitancy of micturition: Secondary | ICD-10-CM

## 2013-07-30 MED ORDER — ACLIDINIUM BROMIDE 400 MCG/ACT IN AEPB: 1.0000 | INHALATION_SPRAY | Freq: Two times a day (BID) | RESPIRATORY_TRACT | Status: DC

## 2013-07-30 MED ORDER — PREDNISONE (PAK) 10 MG PO TABS: ORAL_TABLET | ORAL | Status: DC

## 2013-07-30 MED ORDER — ALBUTEROL SULFATE HFA 108 (90 BASE) MCG/ACT IN AERS: INHALATION_SPRAY | RESPIRATORY_TRACT | Status: DC

## 2013-07-30 MED ORDER — AMOXICILLIN-POT CLAVULANATE 875-125 MG PO TABS: 1.0000 | ORAL_TABLET | Freq: Two times a day (BID) | ORAL | Status: DC

## 2013-07-30 NOTE — Patient Instructions (Addendum)
Stop Spiriva and it should help your urine flow better  STtart tudorza one twice daily after advair  Only use your albuterol (proaire)as a rescue medication to be used if you can't catch your breath by resting or doing a relaxed purse lip breathing pattern.  - The less you use it, the better it will work when you need it. - Ok to use up to every 4 hours if you must but call for immediate appointment if use goes up over your usual need - Don't leave home without it !!  (think of it like your spare tire for your car)   Augmentin 875 mg take one pill twice daily  X 10 days - take at breakfast and supper with large glass of water.  It would help reduce the usual side effects (diarrhea and yeast infections) if you ate cultured yogurt at lunch.   Prednisone 10 mg take  4 each am x 2 days,   2 each am x 2 days,  1 each am x 2 days and stop   Please schedule a follow up office visit in 4 weeks, sooner if needed to see Ramaswamy or Tammy if he's not available Late add  consider change to symbicort  hfa if not better next ov (note the dose of advair is subtherapeutic but may not be able to tolerate the higher dose)

## 2013-07-30 NOTE — Progress Notes (Signed)
Subjective:    Patient ID: Frank Garcia, male    DOB: [birth year], 79 y.o.   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 Overall COPD cat score has gotten worse  - Body mass index is 18.17 kg/(m^2). on 11/13/2011 (weight 116# and up 5# since June 2012)  - - Body mass index is 16.94 kg/(m^2). on 06/09/2013   #Shingles - MArch 2013     #Recurrent COPD exacerbation - March 2013: Office treatment for COPD exacerbation - May 2013: Office treatment with doxycycline and prednisone for COPD exacerbation  - August 2030: Started roflumilast May 2-14 - office RX Possible COPDAE./ Rx doxy and pred  #hoarse voice - ENT evaluation and 2013 normal    OV 04/24/2013  FU  CAchexia: weight loss stopped but no weight gain. WEight stable though. Wants to know how to do better.  Lung nodule: Reviewed ct chest from 03/24/13 and when compared to 2013 and 2012 the RUL apical scar tissue like density is more promient now and measures 1.6cm. This was not there in 2009 and 2010/2011.   CPOugh/COPD: After Rx of AECOPD presumed last OV sympotoms improved and cough and everything is back to baseline. COPD CAT SCore is 16. He is comppliant with mdi. No new issues   REC #COPD  - Stable disease  - Continue your inhalers as before  - Make sure you have flu shot in the fall; I prefer that you have high dose flu shot if this is available  #Lung nodule  -  There is a slow growth in the lung nodule overfew years. Need to figure out if lung cancer or not  - Let us see if we can make a diagnosis or lean towards a diagnosis with blood test  - Do onc-immune blood test - possibly today  - Also do XPRESYS blood test - you will get a call about this test later  - Call you with result next few weeks  #FOllwoup  6 weeks to discuss test results   OV 06/09/2013 Discuss lung noodule and COPD followup Lung nodule RUL (more scar like): Onncimmne test reesult 04/29/13 is LOW level for 7 ung cancer proteins. This test is non diagnostic due to poor sensitivity and high specificity. FYI: in my limited cohort NPV is 70%. Xpresys indi test done 04/29/13 is LIKELY BENGIN for nodule at 86% probability. Overall unclear if tests are helpful.  COPD: stable. Compliant with mdi. HE does not thik is as good as last year but stable dz Weight loss: Again losing weight. But he feels whey is good for him. Appetite is diminished with time rec  no change rx    07/30/2013 acute  ov/Normajean Nash re: GOLD III COPD sp smoking cessation in 2009  Chief Complaint  Patient presents with  . Acute Visit    Pt c/o  having increased DOE x 1 wk and also nasal congestion-esp at night.    also difficulty with urination on spriva Mucus turning yellow esp in am and thicker No resting sob but needing saba much more frequently  No obvious day to day or daytime variabilty or assoc chronic cough or cp or chest tightness, subjective wheeze overt  hb symptoms. No unusual exp hx or h/o childhood pna/ asthma or knowledge of premature birth.    Also denies any obvious fluctuation of symptoms with weather or environmental changes or other aggravating or alleviating factors except as outlined above   Current Medications, Allergies, Complete Past Medical History, Past Surgical History, Family History, and Social History were reviewed in [employer] record.  ROS  The following are not  active complaints unless bolded sore throat, dysphagia, dental problems, itching, sneezing,  nasal congestion or excess/ purulent secretions, ear ache,   fever, chills, sweats, unintended wt loss, pleuritic or exertional cp, hemoptysis,  orthopnea pnd or leg swelling, presyncope, palpitations, heartburn, abdominal pain, anorexia, nausea, vomiting, diarrhea  or change in bowel or urinary habits, change in stools or urine, dysuria,hematuria,  rash, arthralgias, visual complaints, headache, numbness weakness or ataxia or problems with walking or coordination,  change in mood/affect or memory.            .        Objective:   Physical Exam  amb bm nad  Wt Readings from Last 3 Encounters:  07/30/13 113 lb (51.256 kg)  07/16/13 107 lb (48.535 kg)  06/27/13 110 lb (49.896 kg)     HEENT mild turbinate edema.  Oropharynx no thrush or excess pnd or cobblestoning.  No JVD or cervical adenopathy. Mild accessory muscle hypertrophy. Trachea midline, nl thryroid. Chest was hyperinflated by percussion with diminished breath sounds and moderate increased exp time without wheeze. Hoover sign positive at mid inspiration. Regular rate and rhythm without murmur gallop or rub or increase P2 or edema.  Abd: no hsm, nl excursion. Ext warm without cyanosis or clubbing.      CT 06/12/13 Slight decrease conspicuity of the posterior right upper lobe  nodule, as above, when compared to recent prior examination  03/24/2013. Based on the elongated appearance and association with a  slightly bronchiectatic bronchus, this is favored to represent an  area of chronic inflammation in the setting of mucoid impaction.  While a biopsy may be clinically indicated, consideration for  followup chest CT in 3-6 months may also warrant consideration.  2. Mild diffuse bronchial wall thickening with moderate  centrilobular and paraseptal emphysema, imaging findings compatible  with underlying COPD.                Assessment & Plan:

## 2013-08-01 NOTE — Assessment & Plan Note (Addendum)
DDX of  difficult airways managment all start with A and  include Adherence, Ace Inhibitors, Acid Reflux, Active Sinus Disease, Alpha 1 Antitripsin deficiency, Anxiety masquerading as Airways dz,  ABPA,  allergy(esp in young), Aspiration (esp in elderly), Adverse effects of DPI,  Active smokers, plus two Bs  = Bronchiectasis and Beta blocker use..and one C= CHF  Adherence is always the initial "prime suspect" and is a multilayered concern that requires a "trust but verify" approach in every patient - starting with knowing how to use medications, especially inhalers, correctly, keeping up with refills and understanding the fundamental difference between maintenance and prns vs those medications only taken for a very short course and then stopped and not refilled.  The proper method of use, as well as anticipated side effects, of a metered-dose inhaler are discussed and demonstrated to the patient. Improved effectiveness after extensive coaching during this visit to a level of approximately  75%  ? Active/ acute sinusitis > rx augmentin/pred x 6 and regoup if not improving back to baseline including less dependency on saba   ? Adverse effects of dpi > consider change to symbicort  hfa if not better next ov (note the dose of advair is subtherapeutic but may not be able to tolerate the higher dose)

## 2013-08-01 NOTE — Assessment & Plan Note (Signed)
Try off spiriva 07/30/13 and start tudorza which is rapidly metabolized with much less systemic anticholinergic effects than spiriva for most pts  The proper method of use, as well as anticipated side effects, of a dry powder inhaler are discussed and demonstrated to the patient. Improved effectiveness after extensive coaching during this visit to a level of approximately  90%

## 2013-09-01 ENCOUNTER — Ambulatory Visit (INDEPENDENT_AMBULATORY_CARE_PROVIDER_SITE_OTHER): Payer: Medicare Other | Admitting: Internal Medicine

## 2013-09-01 ENCOUNTER — Other Ambulatory Visit (INDEPENDENT_AMBULATORY_CARE_PROVIDER_SITE_OTHER): Payer: Medicare Other

## 2013-09-01 ENCOUNTER — Ambulatory Visit (INDEPENDENT_AMBULATORY_CARE_PROVIDER_SITE_OTHER)
Admission: RE | Admit: 2013-09-01 | Discharge: 2013-09-01 | Disposition: A | Discharge status: Home / Self Care | Payer: Medicare Other | Source: Ambulatory Visit | Attending: Internal Medicine | Admitting: Internal Medicine

## 2013-09-01 ENCOUNTER — Encounter: Payer: Self-pay | Admitting: Internal Medicine

## 2013-09-01 VITALS — BP 102/62 | HR 88 | Ht 67.0 in | Wt 109.2 lb

## 2013-09-01 DIAGNOSIS — R0601 Orthopnea: Secondary | ICD-10-CM

## 2013-09-01 DIAGNOSIS — R339 Retention of urine, unspecified: Secondary | ICD-10-CM

## 2013-09-01 DIAGNOSIS — J449 Chronic obstructive pulmonary disease, unspecified: Secondary | ICD-10-CM

## 2013-09-01 DIAGNOSIS — R911 Solitary pulmonary nodule: Secondary | ICD-10-CM

## 2013-09-01 LAB — CBC WITH DIFFERENTIAL/PLATELET
BASOS ABS: 0 10*3/uL (ref 0.0–0.1)
Basophils Relative: 0.2 % (ref 0.0–3.0)
EOS ABS: 0.1 10*3/uL (ref 0.0–0.7)
Eosinophils Relative: 1.7 % (ref 0.0–5.0)
HCT: 36.3 % — ABNORMAL LOW (ref 39.0–52.0)
HEMOGLOBIN: 11.9 g/dL — AB (ref 13.0–17.0)
Lymphocytes Relative: 21.5 % (ref 12.0–46.0)
Lymphs Abs: 1.1 10*3/uL (ref 0.7–4.0)
MCHC: 32.9 g/dL (ref 30.0–36.0)
MCV: 81.7 fl (ref 78.0–100.0)
MONO ABS: 0.8 10*3/uL (ref 0.1–1.0)
Monocytes Relative: 15.3 % — ABNORMAL HIGH (ref 3.0–12.0)
NEUTROS ABS: 3.2 10*3/uL (ref 1.4–7.7)
Neutrophils Relative %: 61.3 % (ref 43.0–77.0)
Platelets: 245 10*3/uL (ref 150.0–400.0)
RBC: 4.44 Mil/uL (ref 4.22–5.81)
RDW: 15.3 % — AB (ref 11.5–14.6)
WBC: 5.2 10*3/uL (ref 4.5–10.5)

## 2013-09-01 MED ORDER — FLUTICASONE FUROATE-VILANTEROL 100-25 MCG/INH IN AEPB: 1.0000 | INHALATION_SPRAY | Freq: Every day | RESPIRATORY_TRACT | Status: DC

## 2013-09-01 NOTE — Patient Instructions (Addendum)
#  urinary retention  - our stafff wil try to get you early appt with urologist hopefully Jan 2015  #COPD - currently stable but you are getting too many flare ups - continue tudorza  Daily 1 puff twice daily; we will see if urologist can help with urinarry retention before I change this out - change advair to  BREO 1  Puff daily  -stop roflumilast - check CBC with diff to see if any allergy cells can explain frequent flare ups  #edema and orthpnea  - do 2d echo of heart  #nodule in lungs   - all stable  - you probably need a CT chest once a year or so  #FOllowup 6 weeks with COPD CAT score

## 2013-09-01 NOTE — Progress Notes (Signed)
Subjective:    Patient ID: Frank Garcia, male    DOB: [birth year], 80 y.o.   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  - Xpresys indi test done 04/29/13 is LIKELY BENGIN for nodule at 86% probability - 09/01/13 - stabl bilateral UL scarring  # ex-smoker,   #Cachexia it was for the last 1 month she's having increased cough with increased white mucus production but no increase in chest tightness or shortness of breath. Overall COPD cat score has gotten worse  - Body mass index is 18.17 kg/(m^2). on 11/13/2011 (weight 116# and up 5# since June 2012)  - - Body mass index is 16.94 kg/(m^2). on 06/09/2013   #Shingles - MArch 2013     #Recurrent COPD exacerbation - March 2013: Office treatment for COPD exacerbation - May 2013: Office treatment with doxycycline and prednisone for COPD exacerbation  - August 2030: Started roflumilast May 2-14 - office RX Possible COPDAE./ Rx doxy and pred Dec 2014 - office Rx Prdnisone and Augmentin  #hoarse voice - ENT evaluation and 2013 normal    07/30/2013 acute  ov/[facility] re: GOLD III COPD sp smoking cessation in 2009  Chief Complaint  Patient presents with  . Acute Visit    Pt c/o having increased DOE x 1 wk and also nasal congestion-esp at night.    also difficulty with urination on spriva Mucus turning yellow esp in am and thicker No resting sob but needing saba much more frequently  No obvious day to day or daytime variabilty or assoc chronic cough or cp or chest tightness, subjective wheeze  overt  hb symptoms. No unusual exp hx or h/o childhood pna/ asthma or knowledge of premature birth.    Also denies any obvious fluctuation of symptoms with weather or environmental changes or other aggravating or alleviating factors except as outlined above    REC Change spiriva to tudorza Augmentin Prednisone x 6 days  OV 09/01/2013  Chief Complaint  Patient presents with  . Follow-up    Pt saw Dr. Melvyn Novas on 07-30-13 and spiriva was stopped due to urine flow issues. Pt was changed to [place], he states this has not helped flow issues. Pt states he has been having increased cough and increased SOB since last visit.     #COPD'  - overall he is stable but seems to be getting cntinued frequent AECOPD despite roflumilast. Having new orthopnea and pedeal edema several months. Sleeping on recliner. COPD CAT score now at 25. Also having urinary retention despite swithc of spiriva to [place]. Has urology appt only in  72mnths from now; known BPH on rapaflo. THis initially helped but not recently.   #Lung nodule  - stable bilateral UL scarring on CT today. REc is for annual low dose CT    CAT COPD Symptom & Quality of Life Score ([place]) 0 is no burden. 5 is highest burden 09/01/2013   Never Cough ->  Cough all the time 2  No phlegm in chest -> Chest is full of phlegm 2  No chest tightness -> Chest feels very tight 3  No dyspnea for 1 flight stairs/hill -> Very dyspneic for 1 flight of stairs 4  No limitations for ADL at home -> Very limited with ADL at home 3  Confident leaving home -> Not at all confident leaving home 3  Sleep soundly -> Do not sleep soundly because of lung condition 5  Lots of Energy -> No energy at all 3  TOTAL Score (max 40)  25      Scheduled Meds: Tudorxa, advair, foflumilast PRN Meds:.  "   Review of Systems  Constitutional: Negative for fever and unexpected weight change.  HENT: Negative for congestion, dental problem, ear pain, nosebleeds, postnasal  drip, rhinorrhea, sinus pressure, sneezing, sore throat and trouble swallowing.   Eyes: Negative for redness and itching.  Respiratory: Positive for cough and shortness of breath. Negative for chest tightness and wheezing.   Cardiovascular: Negative for palpitations and leg swelling.  Gastrointestinal: Negative for nausea and vomiting.  Genitourinary: Negative for dysuria.  Musculoskeletal: Negative for joint swelling.  Skin: Negative for rash.  Neurological: Negative for headaches.  Hematological: Does not bruise/bleed easily.  Psychiatric/Behavioral: Negative for dysphoric mood. The patient is not nervous/anxious.        Objective:   Physical Exam  Nursing note and vitals reviewed. Constitutional: He is oriented to person, place, and time. He appears well-developed and well-nourished. No distress.  HENT:  Head: Normocephalic and atraumatic.  Right Ear: External ear normal.  Left Ear: External ear normal.  Mouth/Throat: Oropharynx is clear and moist. No oropharyngeal exudate.  Eyes: Conjunctivae and EOM are normal. Pupils are equal, round, and reactive to light. Right eye exhibits no discharge. Left eye exhibits no discharge. No scleral icterus.  Neck: Normal range of motion. Neck supple. No JVD present. No tracheal deviation present. No thyromegaly present.  Cardiovascular: Normal rate, regular rhythm and intact distal pulses.  Exam reveals no gallop and no friction rub.   No murmur heard. Pulmonary/Chest: Effort normal and breath sounds normal. No respiratory distress. He has no wheezes. He has no rales. He exhibits no tenderness.  Abdominal: Soft. Bowel sounds are normal. He exhibits no distension and no mass. There is no tenderness. There is no rebound and no guarding.  Musculoskeletal: Normal range of motion. He exhibits edema. He exhibits no tenderness.  Lymphadenopathy:    He has no cervical adenopathy.  Neurological: He is alert and oriented to person, place, and time. He has  normal reflexes. No cranial nerve deficit. Coordination normal.  Skin: Skin is warm and dry. No rash noted. He is not diaphoretic. No erythema. No pallor.  Psychiatric: He has a normal mood and affect. His behavior is normal. Judgment and thought content normal.          Assessment & Plan:

## 2013-09-03 DIAGNOSIS — R0601 Orthopnea: Secondary | ICD-10-CM | POA: Insufficient documentation

## 2013-09-03 NOTE — Assessment & Plan Note (Signed)
#  COPD - currently stable but you are getting too many flare ups - continue tudorza  Daily 1 puff twice daily; we will see if urologist can help with urinarry retention before I change this out - change advair to  BREO 1  Puff daily  -stop roflumilast - check CBC with diff to see if any allergy cells can explain frequent flare ups #FOllowup 6 weeks with COPD CAT score

## 2013-09-03 NOTE — Assessment & Plan Note (Signed)
#  edema and orthpnea  - do 2d echo of heart

## 2013-09-03 NOTE — Assessment & Plan Note (Signed)
 #  nodule in lungs   - all stable  - you probably need a CT chest once a year or so

## 2013-09-03 NOTE — Assessment & Plan Note (Signed)
#  urinary retention  - our stafff wil try to get you early appt with urologist hopefully Jan 2015

## 2013-09-15 ENCOUNTER — Telehealth: Payer: Self-pay | Admitting: Internal Medicine

## 2013-09-15 ENCOUNTER — Other Ambulatory Visit (HOSPITAL_COMMUNITY): Payer: Medicare Other

## 2013-09-15 MED ORDER — ACLIDINIUM BROMIDE 400 MCG/ACT IN AEPB: 1.0000 | INHALATION_SPRAY | Freq: Two times a day (BID) | RESPIRATORY_TRACT | Status: DC

## 2013-09-15 NOTE — Telephone Encounter (Signed)
Can give 6 refills  Dr. Brand Males, M.D., [facility].C.P Pulmonary and Critical Care Medicine Staff Physician [facility] Pager: [phone], If no answer or between  15:00h - 7:00h: call [phone]  09/15/2013 1:56 PM

## 2013-09-15 NOTE — Telephone Encounter (Signed)
Spoke with patient-he has one dose of Tudorza left for today. Pt needs Rx sent in as he is doing well with it. MR is in the office tomorrow-pt aware I have sent 1 month supply to [facility] and will leave the decision up to Mr to give any refills.

## 2013-09-15 NOTE — Telephone Encounter (Signed)
Pt is aware that we will contact his pharmacy and give 6 additional refills. He requests that we put samples up front for pick up.

## 2013-09-17 ENCOUNTER — Ambulatory Visit (HOSPITAL_COMMUNITY): Payer: Medicare Other | Attending: Cardiology | Admitting: Radiology

## 2013-09-17 DIAGNOSIS — R0601 Orthopnea: Secondary | ICD-10-CM | POA: Insufficient documentation

## 2013-09-17 DIAGNOSIS — Z9981 Dependence on supplemental oxygen: Secondary | ICD-10-CM | POA: Insufficient documentation

## 2013-09-17 DIAGNOSIS — I359 Nonrheumatic aortic valve disorder, unspecified: Secondary | ICD-10-CM | POA: Insufficient documentation

## 2013-09-17 DIAGNOSIS — J449 Chronic obstructive pulmonary disease, unspecified: Secondary | ICD-10-CM | POA: Insufficient documentation

## 2013-09-17 DIAGNOSIS — I10 Essential (primary) hypertension: Secondary | ICD-10-CM | POA: Insufficient documentation

## 2013-09-17 DIAGNOSIS — R609 Edema, unspecified: Secondary | ICD-10-CM

## 2013-09-17 DIAGNOSIS — J4489 Other specified chronic obstructive pulmonary disease: Secondary | ICD-10-CM | POA: Insufficient documentation

## 2013-09-17 NOTE — Progress Notes (Signed)
Echocardiogram performed.  

## 2013-09-24 ENCOUNTER — Emergency Department (HOSPITAL_COMMUNITY): Payer: Medicare Other

## 2013-09-24 ENCOUNTER — Encounter (HOSPITAL_COMMUNITY): Payer: Self-pay | Admitting: Emergency Medicine

## 2013-09-24 ENCOUNTER — Inpatient Hospital Stay (HOSPITAL_COMMUNITY)
Admission: EM | Admit: 2013-09-24 | Discharge: 2013-09-26 | DRG: 193 | Disposition: A | Discharge status: Home / Self Care | Payer: Medicare Other | Attending: Internal Medicine | Admitting: Internal Medicine

## 2013-09-24 DIAGNOSIS — R0601 Orthopnea: Secondary | ICD-10-CM

## 2013-09-24 DIAGNOSIS — R0989 Other specified symptoms and signs involving the circulatory and respiratory systems: Secondary | ICD-10-CM

## 2013-09-24 DIAGNOSIS — I2584 Coronary atherosclerosis due to calcified coronary lesion: Secondary | ICD-10-CM

## 2013-09-24 DIAGNOSIS — J449 Chronic obstructive pulmonary disease, unspecified: Secondary | ICD-10-CM

## 2013-09-24 DIAGNOSIS — R638 Other symptoms and signs concerning food and fluid intake: Secondary | ICD-10-CM

## 2013-09-24 DIAGNOSIS — Z9981 Dependence on supplemental oxygen: Secondary | ICD-10-CM

## 2013-09-24 DIAGNOSIS — R0603 Acute respiratory distress: Secondary | ICD-10-CM

## 2013-09-24 DIAGNOSIS — J441 Chronic obstructive pulmonary disease with (acute) exacerbation: Secondary | ICD-10-CM

## 2013-09-24 DIAGNOSIS — J189 Pneumonia, unspecified organism: Principal | ICD-10-CM

## 2013-09-24 DIAGNOSIS — R42 Dizziness and giddiness: Secondary | ICD-10-CM

## 2013-09-24 DIAGNOSIS — R911 Solitary pulmonary nodule: Secondary | ICD-10-CM

## 2013-09-24 DIAGNOSIS — Z66 Do not resuscitate: Secondary | ICD-10-CM | POA: Diagnosis present

## 2013-09-24 DIAGNOSIS — R918 Other nonspecific abnormal finding of lung field: Secondary | ICD-10-CM

## 2013-09-24 DIAGNOSIS — J9620 Acute and chronic respiratory failure, unspecified whether with hypoxia or hypercapnia: Secondary | ICD-10-CM

## 2013-09-24 DIAGNOSIS — R55 Syncope and collapse: Secondary | ICD-10-CM

## 2013-09-24 DIAGNOSIS — R0609 Other forms of dyspnea: Secondary | ICD-10-CM

## 2013-09-24 DIAGNOSIS — R490 Dysphonia: Secondary | ICD-10-CM

## 2013-09-24 DIAGNOSIS — R339 Retention of urine, unspecified: Secondary | ICD-10-CM

## 2013-09-24 DIAGNOSIS — Z87891 Personal history of nicotine dependence: Secondary | ICD-10-CM

## 2013-09-24 DIAGNOSIS — R3911 Hesitancy of micturition: Secondary | ICD-10-CM

## 2013-09-24 DIAGNOSIS — J3489 Other specified disorders of nose and nasal sinuses: Secondary | ICD-10-CM

## 2013-09-24 DIAGNOSIS — N400 Enlarged prostate without lower urinary tract symptoms: Secondary | ICD-10-CM

## 2013-09-24 HISTORY — DX: Shortness of breath: R06.02

## 2013-09-24 LAB — CBC WITH DIFFERENTIAL/PLATELET
Basophils Absolute: 0 10*3/uL (ref 0.0–0.1)
Basophils Relative: 0 % (ref 0–1)
EOS ABS: 0 10*3/uL (ref 0.0–0.7)
Eosinophils Relative: 0 % (ref 0–5)
HCT: 39.2 % (ref 39.0–52.0)
Hemoglobin: 13.2 g/dL (ref 13.0–17.0)
Lymphocytes Relative: 4 % — ABNORMAL LOW (ref 12–46)
Lymphs Abs: 0.6 10*3/uL — ABNORMAL LOW (ref 0.7–4.0)
MCH: 27 pg (ref 26.0–34.0)
MCHC: 33.7 g/dL (ref 30.0–36.0)
MCV: 80.3 fL (ref 78.0–100.0)
MONOS PCT: 8 % (ref 3–12)
Monocytes Absolute: 1.2 10*3/uL — ABNORMAL HIGH (ref 0.1–1.0)
NEUTROS PCT: 88 % — AB (ref 43–77)
Neutro Abs: 12.9 10*3/uL — ABNORMAL HIGH (ref 1.7–7.7)
PLATELETS: 172 10*3/uL (ref 150–400)
RBC: 4.88 MIL/uL (ref 4.22–5.81)
RDW: 15 % (ref 11.5–15.5)
WBC: 14.6 10*3/uL — ABNORMAL HIGH (ref 4.0–10.5)

## 2013-09-24 LAB — COMPREHENSIVE METABOLIC PANEL
ALT: 9 U/L (ref 0–53)
AST: 19 U/L (ref 0–37)
Albumin: 3.6 g/dL (ref 3.5–5.2)
Alkaline Phosphatase: 83 U/L (ref 39–117)
BUN: 12 mg/dL (ref 6–23)
CO2: 26 mEq/L (ref 19–32)
Calcium: 9.2 mg/dL (ref 8.4–10.5)
Chloride: 103 mEq/L (ref 96–112)
Creatinine, Ser: 0.85 mg/dL (ref 0.50–1.35)
GFR calc Af Amer: >90 mL/min (ref >90)
GFR calc non Af Amer: 79 mL/min — ABNORMAL LOW (ref >90)
Glucose, Bld: 106 mg/dL — ABNORMAL HIGH (ref 70–99)
Potassium: 4.4 mEq/L (ref 3.7–5.3)
SODIUM: 143 meq/L (ref 137–147)
Total Bilirubin: 2.9 mg/dL — ABNORMAL HIGH (ref 0.3–1.2)
Total Protein: 7.6 g/dL (ref 6.0–8.3)

## 2013-09-24 LAB — TROPONIN I
Troponin I: <0.3 ng/mL (ref <0.30)
Troponin I: <0.3 ng/mL (ref <0.30)

## 2013-09-24 LAB — CG4 I-STAT (LACTIC ACID): LACTIC ACID, VENOUS: 2.58 mmol/L — AB (ref 0.5–2.2)

## 2013-09-24 LAB — PRO B NATRIURETIC PEPTIDE: PRO B NATRI PEPTIDE: 87.6 pg/mL (ref 0–450)

## 2013-09-24 MED ORDER — DEXTROSE 5 % IV SOLN
500.0000 mg | INTRAVENOUS | Status: DC
  Administered 2013-09-25 – 2013-09-26 (×2): 500 mg via INTRAVENOUS
  Filled 2013-09-24 (×2): qty 500

## 2013-09-24 MED ORDER — TAMSULOSIN HCL 0.4 MG PO CAPS
0.4000 mg | ORAL_CAPSULE | Freq: Every day | ORAL | Status: DC
  Administered 2013-09-24: 0.4 mg via ORAL
  Filled 2013-09-24: qty 1

## 2013-09-24 MED ORDER — ONDANSETRON HCL 4 MG/2ML IJ SOLN: 4.0000 mg | Freq: Four times a day (QID) | INTRAMUSCULAR | Status: DC | PRN

## 2013-09-24 MED ORDER — ENOXAPARIN SODIUM 40 MG/0.4ML ~~LOC~~ SOLN
40.0000 mg | SUBCUTANEOUS | Status: DC
  Filled 2013-09-24: qty 0.4

## 2013-09-24 MED ORDER — MOMETASONE FURO-FORMOTEROL FUM 100-5 MCG/ACT IN AERO
2.0000 | INHALATION_SPRAY | Freq: Every day | RESPIRATORY_TRACT | Status: DC
  Administered 2013-09-25 – 2013-09-26 (×2): 2 via RESPIRATORY_TRACT
  Filled 2013-09-24: qty 8.8

## 2013-09-24 MED ORDER — ONDANSETRON HCL 4 MG PO TABS: 4.0000 mg | ORAL_TABLET | Freq: Four times a day (QID) | ORAL | Status: DC | PRN

## 2013-09-24 MED ORDER — ALBUTEROL SULFATE (2.5 MG/3ML) 0.083% IN NEBU: 2.5000 mg | INHALATION_SOLUTION | RESPIRATORY_TRACT | Status: DC

## 2013-09-24 MED ORDER — DEXTROSE 5 % IV SOLN
500.0000 mg | Freq: Once | INTRAVENOUS | Status: AC
  Administered 2013-09-24: 500 mg via INTRAVENOUS

## 2013-09-24 MED ORDER — IPRATROPIUM-ALBUTEROL 0.5-2.5 (3) MG/3ML IN SOLN
3.0000 mL | Freq: Four times a day (QID) | RESPIRATORY_TRACT | Status: DC
  Administered 2013-09-24: 3 mL via RESPIRATORY_TRACT
  Filled 2013-09-24: qty 3

## 2013-09-24 MED ORDER — METHYLPREDNISOLONE SODIUM SUCC 125 MG IJ SOLR
60.0000 mg | Freq: Four times a day (QID) | INTRAMUSCULAR | Status: DC
  Administered 2013-09-24: 60 mg via INTRAVENOUS
  Administered 2013-09-24: 20:00:00 via INTRAVENOUS
  Administered 2013-09-25 (×2): 60 mg via INTRAVENOUS
  Filled 2013-09-24 (×8): qty 0.96

## 2013-09-24 MED ORDER — IPRATROPIUM-ALBUTEROL 0.5-2.5 (3) MG/3ML IN SOLN
3.0000 mL | Freq: Three times a day (TID) | RESPIRATORY_TRACT | Status: DC
  Administered 2013-09-25 – 2013-09-26 (×4): 3 mL via RESPIRATORY_TRACT
  Filled 2013-09-24 (×4): qty 3

## 2013-09-24 MED ORDER — SODIUM CHLORIDE 0.9 % IV SOLN
INTRAVENOUS | Status: AC
Start: 2013-09-24 — End: 2013-09-25
  Administered 2013-09-24: 1000 mL via INTRAVENOUS

## 2013-09-24 MED ORDER — FLUTICASONE FUROATE-VILANTEROL 100-25 MCG/INH IN AEPB: 1.0000 | INHALATION_SPRAY | Freq: Every day | RESPIRATORY_TRACT | Status: DC

## 2013-09-24 MED ORDER — TAMSULOSIN HCL 0.4 MG PO CAPS
0.4000 mg | ORAL_CAPSULE | Freq: Every day | ORAL | Status: DC
  Administered 2013-09-25 – 2013-09-26 (×2): 0.4 mg via ORAL
  Filled 2013-09-24 (×4): qty 1

## 2013-09-24 MED ORDER — DEXTROSE 5 % IV SOLN
1.0000 g | INTRAVENOUS | Status: DC
  Administered 2013-09-25 – 2013-09-26 (×2): 1 g via INTRAVENOUS
  Filled 2013-09-24 (×2): qty 10

## 2013-09-24 MED ORDER — ALBUTEROL SULFATE (2.5 MG/3ML) 0.083% IN NEBU: 2.5000 mg | INHALATION_SOLUTION | RESPIRATORY_TRACT | Status: DC | PRN

## 2013-09-24 MED ORDER — DM-GUAIFENESIN ER 30-600 MG PO TB12
1.0000 | ORAL_TABLET | Freq: Two times a day (BID) | ORAL | Status: DC
  Administered 2013-09-24 – 2013-09-26 (×5): 1 via ORAL
  Filled 2013-09-24 (×6): qty 1

## 2013-09-24 MED ORDER — DEXTROSE 5 % IV SOLN
1.0000 g | Freq: Once | INTRAVENOUS | Status: AC
  Administered 2013-09-24: 1 g via INTRAVENOUS
  Filled 2013-09-24: qty 10

## 2013-09-24 MED ORDER — HYDROCODONE-ACETAMINOPHEN 5-325 MG PO TABS: 1.0000 | ORAL_TABLET | ORAL | Status: DC | PRN

## 2013-09-24 MED ORDER — IPRATROPIUM-ALBUTEROL 0.5-2.5 (3) MG/3ML IN SOLN
3.0000 mL | RESPIRATORY_TRACT | Status: DC | PRN
  Administered 2013-09-24: 3 mL via RESPIRATORY_TRACT
  Filled 2013-09-24: qty 3

## 2013-09-24 MED ORDER — ENOXAPARIN SODIUM 30 MG/0.3ML ~~LOC~~ SOLN
30.0000 mg | SUBCUTANEOUS | Status: DC
  Administered 2013-09-24 – 2013-09-25 (×2): 30 mg via SUBCUTANEOUS
  Filled 2013-09-24 (×3): qty 0.3

## 2013-09-24 MED ORDER — SODIUM CHLORIDE 0.9 % IV BOLUS (SEPSIS)
1000.0000 mL | Freq: Once | INTRAVENOUS | Status: DC
  Administered 2013-09-24: 1000 mL via INTRAVENOUS

## 2013-09-24 NOTE — Progress Notes (Signed)
NURSING PROGRESS NOTE  Frank Garcia [id number] Admission Data: 09/24/2013 12:14 PM Attending Provider: Oswald Hillock, MD PCP:No PCP Per Patient Code Status: DNR  Frank Garcia is a 80 y.o. male patient admitted from ED:  -No acute distress noted.  -No complaints of shortness of breath.  -No complaints of chest pain.    Blood pressure 152/60, pulse 94, temperature 98.4 F (36.9 C), temperature source Oral, resp. rate 25, height 5\' 7"  (1.702 m), weight 45.949 kg (101 lb 4.8 oz), SpO2 92.00%.   IV Fluids:  IV in place, occlusive dsg intact without redness, IV cath forearm left, condition patent and no redness normal saline.   Allergies:  Review of patient's allergies indicates no known allergies.  Past Medical History:   has a past medical history of Chronic airway obstruction, not elsewhere classified; BPH (benign prostatic hypertrophy); Tobacco abuse; Weight loss; and Shingles rash (11/13/2011).  Past Surgical History:   has past surgical history that includes No past surgeries.  Social History:   reports that he quit smoking about 6 years ago. His smoking use included Cigarettes. He has a 60 pack-year smoking history. He does not have any smokeless tobacco history on file. He reports that he does not drink alcohol or use illicit drugs.  Skin: warm, dry, intact  Patient/Family orientated to room. Information packet given to patient/family. Admission inpatient armband information verified with patient/family to include name and date of birth and placed on patient arm. Side rails up x 2, fall assessment and education completed with patient/family. Patient/family able to verbalize understanding of risk associated with falls and verbalized understanding to call for assistance before getting out of bed. Call light within reach. Patient/family able to voice and demonstrate understanding of unit orientation instructions.    Will continue to evaluate and treat per MD orders.

## 2013-09-24 NOTE — ED Notes (Signed)
Pt undressed, in gown, on monitor, continuous pulse oximetry, blood pressure cuff and pt receiving a breathing treatment upon arrival via [facility]

## 2013-09-24 NOTE — H&P (Signed)
PCP:   No PCP Per Patient   Chief Complaint:  Shortness of breath  HPI: 80 year old male who   has a past medical history of Chronic airway obstruction, not elsewhere classified; BPH (benign prostatic hypertrophy); Tobacco abuse; Weight loss; and Shingles rash (11/13/2011).today presented to the ED with chief complaint of worsening shortness of breath since yesterday. Patient has a history of COPD and is currently on home oxygen, he complained of shortness of breath more than usual along with cough and phlegm started yesterday. He denies any chest pain, no fever no nausea vomiting or diarrhea. In the ED chest x-ray was done which showed left lower lobe pneumonia. Tried hospitalist called for admission   Allergies:  No Known Allergies    Past Medical History  Diagnosis Date  . Chronic airway obstruction, not elsewhere classified     on home O2 at night  . BPH (benign prostatic hypertrophy)   . Tobacco abuse   . Weight loss   . Shingles rash 11/13/2011    Past Surgical History  Procedure Laterality Date  . No past surgeries      Prior to Admission medications   Medication Sig Start Date End Date Taking? Authorizing Provider  Aclidinium Bromide (TUDORZA PRESSAIR) 400 MCG/ACT AEPB Inhale 1 puff into the lungs 2 (two) times daily.   Yes Historical Provider, MD  albuterol (PROVENTIL HFA;VENTOLIN HFA) 108 (90 BASE) MCG/ACT inhaler Inhale 2 puffs into the lungs every 4 (four) hours as needed for wheezing or shortness of breath.   Yes Historical Provider, MD  Fluticasone Furoate-Vilanterol (BREO ELLIPTA) 100-25 MCG/INH AEPB Inhale 1 puff into the lungs daily. 09/01/13  Yes Brand Males, MD  Nutritional Supplements (ENSURE HIGH PROTEIN PO) Take 1 Can by mouth daily.    Yes Historical Provider, MD  RAPAFLO 8 MG CAPS capsule Take 1 capsule by mouth daily. 12/10/12  Yes Historical Provider, MD    Social History:  reports that he quit smoking about 6 years ago. His smoking use included  Cigarettes. He has a 60 pack-year smoking history. He does not have any smokeless tobacco history on file. He reports that he does not drink alcohol or use illicit drugs.  Family History  Problem Relation Age of Onset  . COPD Father      All the positives are listed in BOLD  Review of Systems:  HEENT: Headache, blurred vision, runny nose, sore throat Neck: Hypothyroidism, hyperthyroidism,,lymphadenopathy Chest : Shortness of breath, history of COPD, Asthma Heart : Chest pain, history of coronary arterey disease GI:  Nausea, vomiting, diarrhea, constipation, GERD GU: Dysuria, urgency, frequency of urination, hematuria, hesitancy Neuro: Stroke, seizures, syncope Psych: Depression, anxiety, hallucinations   Physical Exam: Blood pressure 140/54, pulse 99, temperature 98.4 F (36.9 C), temperature source Oral, resp. rate 23, SpO2 99.00%. Constitutional:   Patient is a well-developed and well-nourished  in no acute distress and cooperative with exam. Head: Normocephalic and atraumatic Mouth: Mucus membranes moist Eyes: PERRL, EOMI, conjunctivae normal Neck: Supple, No Thyromegaly Cardiovascular: RRR, S1 normal, S2 normal Pulmonary/Chest: Decreased breath sounds bilaterally, mild crackles auscultated in the left lower lobe Abdominal: Soft. Non-tender, non-distended, bowel sounds are normal, no masses, organomegaly, or guarding present.  Neurological: A&O x3, Strenght is normal and symmetric bilaterally, cranial nerve II-XII are grossly intact, no focal motor deficit, sensory intact to light touch bilaterally.  Extremities : No Cyanosis, Clubbing or Edema   Labs on Admission:  Results for orders placed during the hospital encounter of 09/24/13 (from the past  48 hour(s))  CBC WITH DIFFERENTIAL     Status: Abnormal   Collection Time    09/24/13  8:24 AM      Result Value Range   WBC 14.6 (*) 4.0 - 10.5 K/uL   RBC 4.88  4.22 - 5.81 MIL/uL   Hemoglobin 13.2  13.0 - 17.0 g/dL   HCT  39.2  39.0 - 52.0 %   MCV 80.3  78.0 - 100.0 fL   MCH 27.0  26.0 - 34.0 pg   MCHC 33.7  30.0 - 36.0 g/dL   RDW 15.0  11.5 - 15.5 %   Platelets 172  150 - 400 K/uL   Neutrophils Relative % 88 (*) 43 - 77 %   Neutro Abs 12.9 (*) 1.7 - 7.7 K/uL   Lymphocytes Relative 4 (*) 12 - 46 %   Lymphs Abs 0.6 (*) 0.7 - 4.0 K/uL   Monocytes Relative 8  3 - 12 %   Monocytes Absolute 1.2 (*) 0.1 - 1.0 K/uL   Eosinophils Relative 0  0 - 5 %   Eosinophils Absolute 0.0  0.0 - 0.7 K/uL   Basophils Relative 0  0 - 1 %   Basophils Absolute 0.0  0.0 - 0.1 K/uL  COMPREHENSIVE METABOLIC PANEL     Status: Abnormal   Collection Time    09/24/13  8:24 AM      Result Value Range   Sodium 143  137 - 147 mEq/L   Potassium 4.4  3.7 - 5.3 mEq/L   Chloride 103  96 - 112 mEq/L   CO2 26  19 - 32 mEq/L   Glucose, Bld 106 (*) 70 - 99 mg/dL   BUN 12  6 - 23 mg/dL   Creatinine, Ser 0.85  0.50 - 1.35 mg/dL   Calcium 9.2  8.4 - 10.5 mg/dL   Total Protein 7.6  6.0 - 8.3 g/dL   Albumin 3.6  3.5 - 5.2 g/dL   AST 19  0 - 37 U/L   ALT 9  0 - 53 U/L   Alkaline Phosphatase 83  39 - 117 U/L   Total Bilirubin 2.9 (*) 0.3 - 1.2 mg/dL   GFR calc non Af Amer 79 (*) >90 mL/min   GFR calc Af Amer >90  >90 mL/min   Comment: (NOTE)     The eGFR has been calculated using the CKD EPI equation.     This calculation has not been validated in all clinical situations.     eGFR's persistently <90 mL/min signify possible Chronic Kidney     Disease.  PRO B NATRIURETIC PEPTIDE     Status: None   Collection Time    09/24/13  8:24 AM      Result Value Range   Pro B Natriuretic peptide (BNP) 87.6  0 - 450 pg/mL  TROPONIN I     Status: None   Collection Time    09/24/13  8:24 AM      Result Value Range   Troponin I <0.30  <0.30 ng/mL   Comment:            Due to the release kinetics of cTnI,     a negative result within the first hours     of the onset of symptoms does not rule out     myocardial infarction with certainty.     If  myocardial infarction is still suspected,     repeat the test at appropriate intervals.  CG4 I-STAT (  LACTIC ACID)     Status: Abnormal   Collection Time    09/24/13  9:11 AM      Result Value Range   Lactic Acid, Venous 2.58 (*) 0.5 - 2.2 mmol/L    Radiological Exams on Admission: Dg Chest 2 View  09/24/2013   CLINICAL DATA:  Short of breath  EXAM: CHEST  2 VIEW  COMPARISON:  CT chest 09/01/2013.  Chest x-ray 11/27/2011  FINDINGS: COPD and emphysema with hyperinflation of the lungs.  Left lower lobe infiltrate consistent with pneumonia. No significant effusion. Negative for heart failure.  IMPRESSION: Severe COPD with left lower lobe pneumonia.   Electronically Signed   By: Franchot Gallo M.D.   On: 09/24/2013 08:20    Assessment/Plan Active Problems:   CAP (community acquired pneumonia)   Pneumonia COPD Exacerbation  Community-acquired pneumonia Patient will be admitted to the hospital and will obtain urine strep antigen and Legionella antigen. Will start Rocephin and Zithromax. Patient does have mild elevation of lactate of 2.58. Continue IV fluids  COPD exacerbation We'll start Solu Medrol 60 mg IV every 6 hours, DuoNeb every 6 hours, antibiotics as above.  EKG changes EKG shows nonspecific ST-T changes him a him in the ED is negative we'll obtain troponin every 6 hours x3.  Left lung nodule Patient has a history of left lung nodule which is followed by pulmonary as outpatient. Last CT was on 09/01/13 which showed unchanged  nodule from previous.  DVT prophylaxis Lovenox  Code status: Patient is DO NOT RESUSCITATE  Family discussion: Discussed with patient's son and daughter at bedside   Time Spent on Admission: 61 min  [facility] Pager: [phone] 09/24/2013, 10:55 AM  If 7PM-7AM, please contact night-coverage  [email or web address]  Password [email or web address]

## 2013-09-24 NOTE — ED Notes (Signed)
Pt coughing up yellow mucous x2days and progressively SOB. Sats in 80's when EMS arrived, put on 4L [place] and sats 97%. Pt normally wears 2L [place] for COPD. 180/80, HR 115, CBG 85. Pt was given 1 neb treatment of 5 albuterol and 0.5 atrovent, and 125mg  of solu medrol.

## 2013-09-24 NOTE — ED Notes (Signed)
Gave pt coffee

## 2013-09-24 NOTE — ED Provider Notes (Signed)
CSN: [id number]     Arrival date & time 09/24/13  6834 History   First MD Initiated Contact with Patient 09/24/13 [phone]     Chief Complaint  Patient presents with  . Shortness of Breath   (Consider location/radiation/quality/duration/timing/severity/associated sxs/prior Treatment) HPI Comments: 14 hours of SOB.  Did not use home inhalers prior to calling EMS this morning. Was given duoneb, solumedrol PTA.    Patient is a 80 y.o. male presenting with shortness of breath. The history is provided by the patient and the EMS personnel. No language interpreter was used.  Shortness of Breath Severity:  Severe Onset quality:  Sudden Duration:  14 hours Timing:  Constant Progression:  Unchanged Chronicity:  Recurrent Relieved by:  Rest Worsened by:  Exertion and movement Ineffective treatments:  None tried Associated symptoms: cough, sputum production and wheezing   Associated symptoms: no abdominal pain, no chest pain, no diaphoresis, no fever, no headaches, no hemoptysis, no rash, no sore throat and no vomiting   Cough:    Cough characteristics:  Non-productive   Sputum characteristics:  Yellow   Severity:  Mild   Duration:  14 hours   Timing:  Intermittent   Progression:  Unchanged   Chronicity:  Chronic Risk factors: no recent alcohol use, no recent surgery and no tobacco use   Risk factors comment:  COPD   Past Medical History  Diagnosis Date  . Chronic airway obstruction, not elsewhere classified     on home O2 at night  . BPH (benign prostatic hypertrophy)   . Tobacco abuse   . Weight loss   . Shingles rash 11/13/2011   Past Surgical History  Procedure Laterality Date  . No past surgeries     Family History  Problem Relation Age of Onset  . COPD Father    History  Substance Use Topics  . Smoking status: Former Smoker -- 1.50 packs/day for 40 years    Types: Cigarettes    Quit date: 08/29/2007  . Smokeless tobacco: Not on file  . Alcohol Use: No    Review of  Systems  Constitutional: Negative for fever, diaphoresis, activity change, appetite change and fatigue.  HENT: Negative for congestion, facial swelling, rhinorrhea, sore throat and trouble swallowing.   Eyes: Negative for photophobia and pain.  Respiratory: Positive for cough, sputum production, shortness of breath and wheezing. Negative for hemoptysis and chest tightness.   Cardiovascular: Negative for chest pain and leg swelling.  Gastrointestinal: Negative for nausea, vomiting, abdominal pain, diarrhea and constipation.  Endocrine: Negative for polydipsia and polyuria.  Genitourinary: Negative for dysuria, urgency, decreased urine volume and difficulty urinating.  Musculoskeletal: Negative for back pain and gait problem.  Skin: Negative for color change, rash and wound.  Allergic/Immunologic: Negative for immunocompromised state.  Neurological: Negative for dizziness, facial asymmetry, speech difficulty, weakness, numbness and headaches.  Psychiatric/Behavioral: Negative for confusion, decreased concentration and agitation.    Allergies  Review of patient's allergies indicates no known allergies.  Home Medications   Current Outpatient Rx  Name  Route  Sig  Dispense  Refill  . Aclidinium Bromide (TUDORZA PRESSAIR) 400 MCG/ACT AEPB   Inhalation   Inhale 1 puff into the lungs 2 (two) times daily.         Marland Kitchen albuterol (PROVENTIL HFA;VENTOLIN HFA) 108 (90 BASE) MCG/ACT inhaler   Inhalation   Inhale 2 puffs into the lungs every 4 (four) hours as needed for wheezing or shortness of breath.         Marland Kitchen  Fluticasone Furoate-Vilanterol (BREO ELLIPTA) 100-25 MCG/INH AEPB   Inhalation   Inhale 1 puff into the lungs daily.   84 each   2   . Nutritional Supplements (ENSURE HIGH PROTEIN PO)   Oral   Take 1 Can by mouth daily.          Marland Kitchen RAPAFLO 8 MG CAPS capsule   Oral   Take 1 capsule by mouth daily.          BP 140/54  Pulse 99  Temp(Src) 98.4 F (36.9 C) (Oral)  Resp 23   SpO2 99% Physical Exam  Constitutional: He is oriented to person, place, and time. He appears cachectic. He has a sickly appearance. He appears distressed.  HENT:  Head: Normocephalic and atraumatic.  Mouth/Throat: No oropharyngeal exudate.  Eyes: Pupils are equal, round, and reactive to light.  Neck: Normal range of motion. Neck supple.  Cardiovascular: Normal heart sounds.  An irregular rhythm present. Tachycardia present.  Exam reveals no gallop and no friction rub.   No murmur heard. Pulmonary/Chest: Accessory muscle usage present. Tachypnea noted. He is in respiratory distress. He has decreased breath sounds in the right upper field, the right middle field, the right lower field, the left upper field, the left middle field and the left lower field. He has no wheezes. He has no rales.  Prolonged expiratory phase  Abdominal: Soft. Bowel sounds are normal. He exhibits no distension and no mass. There is no tenderness. There is no rebound and no guarding.  Musculoskeletal: Normal range of motion. He exhibits no edema and no tenderness.  Neurological: He is alert and oriented to person, place, and time.  Skin: Skin is warm and dry.  Psychiatric: He has a normal mood and affect.    ED Course  Procedures (including critical care time) Labs Review Labs Reviewed  CBC WITH DIFFERENTIAL - Abnormal; Notable for the following:    WBC 14.6 (*)    Neutrophils Relative % 88 (*)    Neutro Abs 12.9 (*)    Lymphocytes Relative 4 (*)    Lymphs Abs 0.6 (*)    Monocytes Absolute 1.2 (*)    All other components within normal limits  COMPREHENSIVE METABOLIC PANEL - Abnormal; Notable for the following:    Glucose, Bld 106 (*)    Total Bilirubin 2.9 (*)    GFR calc non Af Amer 79 (*)    All other components within normal limits  CG4 I-STAT (LACTIC ACID) - Abnormal; Notable for the following:    Lactic Acid, Venous 2.58 (*)    All other components within normal limits  PRO B NATRIURETIC PEPTIDE   TROPONIN I   Imaging Review Dg Chest 2 View  09/24/2013   CLINICAL DATA:  Short of breath  EXAM: CHEST  2 VIEW  COMPARISON:  CT chest 09/01/2013.  Chest x-ray 11/27/2011  FINDINGS: COPD and emphysema with hyperinflation of the lungs.  Left lower lobe infiltrate consistent with pneumonia. No significant effusion. Negative for heart failure.  IMPRESSION: Severe COPD with left lower lobe pneumonia.   Electronically Signed   By: Franchot Gallo M.D.   On: 09/24/2013 08:20    EKG Interpretation    Date/Time:  Wednesday September 24 2013 07:49:29 EST Ventricular Rate:  98 PR Interval:    QRS Duration: 141 QT Interval:  378 QTC Calculation: 483 R Axis:   -89 Text Interpretation:  Sinus rhythm Ventricular premature complex Nonspecific IVCD with LAD Anteroseptal infarct , age undetermined Artifact Confirmed by  Adamari Frede  MD, [place] [phone]) on 09/24/2013 8:11:58 AM            MDM   1. Respiratory distress   2. CAP (community acquired pneumonia)   3. COPD exacerbation    Pt is a 80 y.o. male with Pmhx as above who presents with SOB for about 14 hours.  No CP, fever, n/v, d/a, LE pain or swelling, sick contacts.  Pt wears 2L by [place] at home, was reportedly in 80's upon EMS arrival. Here, pt tachycardic, with inc WOB, accesory muscle use.  Able to speak 1 sentence at a time. Breath sounds decreased throughout w/ prolonged expiratory phase. No wheezing or rales heard. CXR showed hyperinflation, LLL pna.  CBC w/ elevated WBC.  BMP, BNP, trop ordered were unremarkable except for T bili of 2.9.  LA elevated at 2.58.  Rocephin/azithro ordered for CAP, 1L NS and Duoneb given with moderate improvement. Pt is risk class IV by PORT score (8.2-9.3% mortality risk).  [facility] consulted for admission, Dr. Darrick Meigs accepting to tele.         Neta Ehlers, MD 09/24/13 1010

## 2013-09-24 NOTE — ED Notes (Signed)
Applied ice and elevated pt's arm after IV infiltrated.

## 2013-09-24 NOTE — ED Notes (Signed)
Lactic acid results shown to Dr. Tawnya Crook

## 2013-09-25 LAB — COMPREHENSIVE METABOLIC PANEL
ALT: 7 U/L (ref 0–53)
AST: 16 U/L (ref 0–37)
Albumin: 3 g/dL — ABNORMAL LOW (ref 3.5–5.2)
Alkaline Phosphatase: 71 U/L (ref 39–117)
BUN: 17 mg/dL (ref 6–23)
CALCIUM: 8.7 mg/dL (ref 8.4–10.5)
CO2: 25 mEq/L (ref 19–32)
CREATININE: 0.88 mg/dL (ref 0.50–1.35)
Chloride: 104 mEq/L (ref 96–112)
GFR calc non Af Amer: 78 mL/min — ABNORMAL LOW (ref >90)
GLUCOSE: 125 mg/dL — AB (ref 70–99)
Potassium: 4.3 mEq/L (ref 3.7–5.3)
Sodium: 142 mEq/L (ref 137–147)
Total Bilirubin: 1.3 mg/dL — ABNORMAL HIGH (ref 0.3–1.2)
Total Protein: 6.9 g/dL (ref 6.0–8.3)

## 2013-09-25 LAB — CBC
HCT: 34.5 % — ABNORMAL LOW (ref 39.0–52.0)
HEMOGLOBIN: 11.4 g/dL — AB (ref 13.0–17.0)
MCH: 26.5 pg (ref 26.0–34.0)
MCHC: 33 g/dL (ref 30.0–36.0)
MCV: 80 fL (ref 78.0–100.0)
Platelets: 178 10*3/uL (ref 150–400)
RBC: 4.31 MIL/uL (ref 4.22–5.81)
RDW: 15.1 % (ref 11.5–15.5)
WBC: 16.2 10*3/uL — ABNORMAL HIGH (ref 4.0–10.5)

## 2013-09-25 LAB — TROPONIN I: Troponin I: <0.3 ng/mL (ref <0.30)

## 2013-09-25 MED ORDER — METHYLPREDNISOLONE SODIUM SUCC 40 MG IJ SOLR
40.0000 mg | Freq: Two times a day (BID) | INTRAMUSCULAR | Status: DC
  Administered 2013-09-25 – 2013-09-26 (×2): 40 mg via INTRAVENOUS
  Filled 2013-09-25 (×4): qty 1

## 2013-09-25 NOTE — Progress Notes (Addendum)
PATIENT DETAILS Name: Frank Garcia Age: 80 y.o. Sex: male Date of Birth: [birth year] Admit Date: 09/24/2013 Admitting Physician Oswald Hillock, MD PCP:No PCP Per Patient  Subjective: Feeling much better today. Has COPD and uses home O2 at baseline.  Assessment/Plan: Active Problems:  Acute on chronic respiratory failure  - Second of pneumonia and COPD exacerbation. Taper off oxygen. Continue to treat pneumonia and COPD     CAP (community acquired pneumonia) - Patient was admitted, started on Rocephin and Zithromax. Rapidly improved, continue with Rocephin and Zithromax for now, will transition to levofloxacin on discharge. - Continue to monitor for 1 more day, if continues to rapidly improve home tomorrow  COPD with acute exacerbation - Lungs completely clear this morning. Patient claims he is breathing significantly better and very close to his usual baseline. - Decrease Solu-Medrol to 40 mg IV every 12, plan on transitioning to prednisone more. - Continue with scheduled nebulized bronchodilators.  BPH - Continue with Flomax.  Disposition: Remain inpatient- home 1/30  DVT Prophylaxis: Prophylactic Lovenox   Code Status: Full code   Family Communication Daughter and granddaughter at bedside  Procedures:  None  CONSULTS:  None  Time spent 40 minutes-which includes 50% of the time with face-to-face with patient/ family and coordinating care related to the above assessment and plan.    MEDICATIONS: Scheduled Meds: . azithromycin  500 mg Intravenous Q24H  . cefTRIAXone (ROCEPHIN)  IV  1 g Intravenous Q24H  . dextromethorphan-guaiFENesin  1 tablet Oral BID  . enoxaparin (LOVENOX) injection  30 mg Subcutaneous Q24H  . ipratropium-albuterol  3 mL Nebulization TID  . methylPREDNISolone (SOLU-MEDROL) injection  40 mg Intravenous Q12H  . mometasone-formoterol  2 puff Inhalation Daily  . tamsulosin  0.4 mg Oral Daily   Continuous Infusions:  PRN  Meds:.albuterol, HYDROcodone-acetaminophen, ondansetron (ZOFRAN) IV, ondansetron  Antibiotics: Anti-infectives   Start     Dose/Rate Route Frequency Ordered Stop   09/25/13 1000  cefTRIAXone (ROCEPHIN) 1 g in dextrose 5 % 50 mL IVPB     1 g 100 mL/hr over 30 Minutes Intravenous Every 24 hours 09/24/13 1158     09/25/13 0800  azithromycin (ZITHROMAX) 500 mg in dextrose 5 % 250 mL IVPB     500 mg 250 mL/hr over 60 Minutes Intravenous Every 24 hours 09/24/13 1158     09/24/13 0900  cefTRIAXone (ROCEPHIN) 1 g in dextrose 5 % 50 mL IVPB     1 g 100 mL/hr over 30 Minutes Intravenous  Once 09/24/13 0850 09/24/13 0951   09/24/13 0900  azithromycin (ZITHROMAX) 500 mg in dextrose 5 % 250 mL IVPB     500 mg 250 mL/hr over 60 Minutes Intravenous  Once 09/24/13 0850 09/24/13 1110       PHYSICAL EXAM: Vital signs in last 24 hours: Filed Vitals:   09/24/13 1942 09/24/13 2026 09/25/13 0334 09/25/13 0735  BP:  131/69 128/68   Pulse:  99 85   Temp:  97.9 F (36.6 C) 98.3 F (36.8 C)   TempSrc:  Oral Oral   Resp:  22 20   Height:      Weight:      SpO2: 93% 100% 98% 98%    Weight change:  Filed Weights   09/24/13 1150  Weight: 45.949 kg (101 lb 4.8 oz)   Body mass index is 15.86 kg/(m^2).   Gen Exam: Awake and alert with clear speech.   Neck: Supple, No JVD.  Chest: B/L Clear.   CVS: S1 S2 Regular, no murmurs. Abdomen: soft, BS +, non tender, non distended.  Extremities: no edema, lower extremities warm to touch. Neurologic: Non Focal.   Skin: No Rash.   Wounds: N/A.    Intake/Output from previous day:  Intake/Output Summary (Last 24 hours) at 09/25/13 1059 Last data filed at 09/25/13 1004  Gross per 24 hour  Intake    720 ml  Output   1560 ml  Net   -840 ml     LAB RESULTS: CBC  Recent Labs Lab 09/24/13 0824 09/25/13 0602  WBC 14.6* 16.2*  HGB 13.2 11.4*  HCT 39.2 34.5*  PLT 172 178  MCV 80.3 80.0  MCH 27.0 26.5  MCHC 33.7 33.0  RDW 15.0 15.1  LYMPHSABS  0.6*  --   MONOABS 1.2*  --   EOSABS 0.0  --   BASOSABS 0.0  --     Chemistries   Recent Labs Lab 09/24/13 0824 09/25/13 0602  NA 143 142  K 4.4 4.3  CL 103 104  CO2 26 25  GLUCOSE 106* 125*  BUN 12 17  CREATININE 0.85 0.88  CALCIUM 9.2 8.7    CBG: No results found for this basename: GLUCAP,  in the last 168 hours  GFR Estimated Creatinine Clearance: 42 ml/min (by C-G formula based on Cr of 0.88).  Coagulation profile No results found for this basename: INR, PROTIME,  in the last 168 hours  Cardiac Enzymes  Recent Labs Lab 09/24/13 1215 09/24/13 1840 09/24/13 2351  TROPONINI <0.30 <0.30 <0.30    No components found with this basename: POCBNP,  No results found for this basename: DDIMER,  in the last 72 hours No results found for this basename: HGBA1C,  in the last 72 hours No results found for this basename: CHOL, HDL, LDLCALC, TRIG, CHOLHDL, LDLDIRECT,  in the last 72 hours No results found for this basename: TSH, T4TOTAL, FREET3, T3FREE, THYROIDAB,  in the last 72 hours No results found for this basename: VITAMINB12, FOLATE, FERRITIN, TIBC, IRON, RETICCTPCT,  in the last 72 hours No results found for this basename: LIPASE, AMYLASE,  in the last 72 hours  Urine Studies No results found for this basename: UACOL, UAPR, USPG, UPH, UTP, UGL, UKET, UBIL, UHGB, UNIT, UROB, ULEU, UEPI, UWBC, URBC, UBAC, CAST, CRYS, UCOM, BILUA,  in the last 72 hours  MICROBIOLOGY: No results found for this or any previous visit (from the past 240 hour(s)).  RADIOLOGY STUDIES/RESULTS: Dg Chest 2 View  09/24/2013   CLINICAL DATA:  Short of breath  EXAM: CHEST  2 VIEW  COMPARISON:  CT chest 09/01/2013.  Chest x-ray 11/27/2011  FINDINGS: COPD and emphysema with hyperinflation of the lungs.  Left lower lobe infiltrate consistent with pneumonia. No significant effusion. Negative for heart failure.  IMPRESSION: Severe COPD with left lower lobe pneumonia.   Electronically Signed   By:  Franchot Gallo M.D.   On: 09/24/2013 08:20   Ct Chest Wo Contrast  09/01/2013   CLINICAL DATA:  Follow-up right lung nodule.  EXAM: CT CHEST WITHOUT CONTRAST  TECHNIQUE: Multidetector CT imaging of the chest was performed following the standard protocol without IV contrast.  COMPARISON:  06/12/2013, 12/11/2011 and 02/10/2011.  FINDINGS: Sub cm low-attenuation lesions in the thyroid are again noted. No pathologically enlarged mediastinal or axillary lymph nodes. Hilar regions are difficult to definitively evaluate without IV contrast. Atherosclerotic calcification of the arterial vasculature, including coronary arteries. Heart size normal. No pericardial  effusion.  Extensive centrilobular emphysema. Scattered areas of scarring in the upper lobes. A focal area of confluent scarring in the posterior segment right upper lobe (image 27) is seen without a nodular component, as on recent prior exams. 4 mm subpleural nodule in the inferior left lower lobe is unchanged from 02/10/2011 and is considered benign. No pleural fluid. Adherent debris in the airway.  Incidental imaging of the upper abdomen shows a 1.6 cm low-attenuation lesion in the dome of the liver, unchanged. No worrisome lytic or sclerotic lesions.  IMPRESSION: Severe centrilobular emphysema with scattered areas of upper lobe predominant confluent scarring. No definite discrete nodules. Consider annual low dose CT Lung screening in future evaluation.   Electronically Signed   By: Lorin Picket M.D.   On: 09/01/2013 14:05    Oren Binet, MD  [facility] Pager:[phone]  If 7PM-7AM, please contact night-coverage [email or web address] Password [email or web address] 09/25/2013, 10:59 AM   LOS: 1 day

## 2013-09-26 ENCOUNTER — Telehealth: Payer: Self-pay | Admitting: *Deleted

## 2013-09-26 DIAGNOSIS — J9620 Acute and chronic respiratory failure, unspecified whether with hypoxia or hypercapnia: Secondary | ICD-10-CM

## 2013-09-26 DIAGNOSIS — N400 Enlarged prostate without lower urinary tract symptoms: Secondary | ICD-10-CM

## 2013-09-26 DIAGNOSIS — J441 Chronic obstructive pulmonary disease with (acute) exacerbation: Secondary | ICD-10-CM

## 2013-09-26 MED ORDER — GUAIFENESIN ER 600 MG PO TB12: 600.0000 mg | ORAL_TABLET | Freq: Two times a day (BID) | ORAL | Status: DC

## 2013-09-26 MED ORDER — PREDNISONE 1 MG PO TABS: ORAL_TABLET | ORAL | Status: DC

## 2013-09-26 MED ORDER — SALINE SPRAY 0.65 % NA SOLN
1.0000 | NASAL | Status: DC | PRN
  Filled 2013-09-26: qty 44

## 2013-09-26 MED ORDER — LEVOFLOXACIN 750 MG PO TABS: 750.0000 mg | ORAL_TABLET | Freq: Every day | ORAL | Status: DC

## 2013-09-26 MED ORDER — ALBUTEROL SULFATE (2.5 MG/3ML) 0.083% IN NEBU: 2.5000 mg | INHALATION_SOLUTION | RESPIRATORY_TRACT | Status: DC | PRN

## 2013-09-26 NOTE — Telephone Encounter (Signed)
Called and Caprice Renshaw has been approved from 08/27/2013 through 09/26/2014.  Pharmacy is aware.

## 2013-09-26 NOTE — Care Management Note (Signed)
    Page 1 of 1   09/26/2013     12:38:07 PM   CARE MANAGEMENT NOTE 09/26/2013  Patient:  Frank Garcia, Frank Garcia   Account Number:  [id number]  Date Initiated:  09/26/2013  Documentation initiated by:  Tomi Bamberger  Subjective/Objective Assessment:   dx cap  admit- lives with family.     Action/Plan:   Anticipated DC Date:  09/26/2013   Anticipated DC Plan:  [place]  CM consult      Choice offered to / List presented to:             Status of service:  Completed, signed off Medicare Important Message given?   (If response is "NO", the following Medicare IM given date fields will be blank) Date Medicare IM given:   Date Additional Medicare IM given:    Discharge Disposition:  HOME/SELF CARE  Per UR Regulation:  Reviewed for med. necessity/level of care/duration of stay  If discussed at [place] of Stay Meetings, dates discussed:    Comments:  09/26/13 12:35 Tomi Bamberger RN, BSN [phone] patient is for dc home today, no NCM referral.  No needs anticipated.

## 2013-09-26 NOTE — Discharge Summary (Signed)
PATIENT DETAILS Name: Frank Garcia Age: 80 y.o. Sex: male Date of Birth: Jan 23, 1929 MRN: [id number]. Admit Date: 09/24/2013 Admitting Physician: Oswald Hillock, MD PCP:No PCP Per Patient  Recommendations for Outpatient Follow-up:  1. Optimize COPD medications 2. Please repeat chest x-ray in 6-8 weeks to document clearance of the pneumonia 3. On home O2   PRIMARY DISCHARGE DIAGNOSIS:  Active Problems:   CAP (community acquired pneumonia)   Pneumonia      PAST MEDICAL HISTORY: Past Medical History  Diagnosis Date  . Chronic airway obstruction, not elsewhere classified     on home O2 at night  . BPH (benign prostatic hypertrophy)   . Tobacco abuse   . Weight loss   . Shingles rash 11/13/2011  . Shortness of breath     DISCHARGE MEDICATIONS:   Medication List         albuterol (2.5 MG/3ML) 0.083% nebulizer solution  Commonly known as:  PROVENTIL  Take 3 mLs (2.5 mg total) by nebulization every 2 (two) hours as needed for wheezing or shortness of breath.     albuterol 108 (90 BASE) MCG/ACT inhaler  Commonly known as:  PROVENTIL HFA;VENTOLIN HFA  Inhale 2 puffs into the lungs every 4 (four) hours as needed for wheezing or shortness of breath.     ENSURE HIGH PROTEIN PO  Take 1 Can by mouth daily.     Fluticasone Furoate-Vilanterol 100-25 MCG/INH Aepb  Commonly known as:  BREO ELLIPTA  Inhale 1 puff into the lungs daily.     guaiFENesin 600 MG 12 hr tablet  Commonly known as:  MUCINEX  Take 1 tablet (600 mg total) by mouth 2 (two) times daily.     levofloxacin 750 MG tablet  Commonly known as:  LEVAQUIN  Take 1 tablet (750 mg total) by mouth daily.     predniSONE 1 MG tablet  Commonly known as:  DELTASONE  - Take 60 mg (6 tablets) for 2 days,then,  - Take 50 mg (5 tablets) for 2 days,then,  - Take 40 mg (4 tablets) for 2 days,then,  - Take 30 mg (3 tablets) for 2 days,then,  - Take 20 mg (2 tablets) for 2 days,then,  - Take 10 mg (1 tablet) for 2  days,then-stop     RAPAFLO 8 MG Caps capsule  Generic drug:  silodosin  Take 1 capsule by mouth daily.     TUDORZA PRESSAIR 400 MCG/ACT Aepb  Generic drug:  Aclidinium Bromide  Inhale 1 puff into the lungs 2 (two) times daily.        ALLERGIES:  No Known Allergies  BRIEF HPI:  See H&P, Labs, Consult and Test reports for all details in brief, patient is a 80 year old [place] American male with a history of COPD, chronic respiratory failure on home O2, BPH who presented with cough and shortness of breath. Patient was evaluated in the emergency room with a chest x-ray which showed left lower lobe pneumonia. Patient was admitted for further evaluation and treatment  CONSULTATIONS:   None  PERTINENT RADIOLOGIC STUDIES: Dg Chest 2 View  09/24/2013   CLINICAL DATA:  Short of breath  EXAM: CHEST  2 VIEW  COMPARISON:  CT chest 09/01/2013.  Chest x-ray 11/27/2011  FINDINGS: COPD and emphysema with hyperinflation of the lungs.  Left lower lobe infiltrate consistent with pneumonia. No significant effusion. Negative for heart failure.  IMPRESSION: Severe COPD with left lower lobe pneumonia.   Electronically Signed   By: Franchot Gallo M.D.  On: 09/24/2013 08:20   Ct Chest Wo Contrast  09/01/2013   CLINICAL DATA:  Follow-up right lung nodule.  EXAM: CT CHEST WITHOUT CONTRAST  TECHNIQUE: Multidetector CT imaging of the chest was performed following the standard protocol without IV contrast.  COMPARISON:  06/12/2013, 12/11/2011 and 02/10/2011.  FINDINGS: Sub cm low-attenuation lesions in the thyroid are again noted. No pathologically enlarged mediastinal or axillary lymph nodes. Hilar regions are difficult to definitively evaluate without IV contrast. Atherosclerotic calcification of the arterial vasculature, including coronary arteries. Heart size normal. No pericardial effusion.  Extensive centrilobular emphysema. Scattered areas of scarring in the upper lobes. A focal area of confluent scarring in the  posterior segment right upper lobe (image 27) is seen without a nodular component, as on recent prior exams. 4 mm subpleural nodule in the inferior left lower lobe is unchanged from 02/10/2011 and is considered benign. No pleural fluid. Adherent debris in the airway.  Incidental imaging of the upper abdomen shows a 1.6 cm low-attenuation lesion in the dome of the liver, unchanged. No worrisome lytic or sclerotic lesions.  IMPRESSION: Severe centrilobular emphysema with scattered areas of upper lobe predominant confluent scarring. No definite discrete nodules. Consider annual low dose CT Lung screening in future evaluation.   Electronically Signed   By: Lorin Picket M.D.   On: 09/01/2013 14:05     PERTINENT LAB RESULTS: CBC:  Recent Labs  09/24/13 0824 09/25/13 0602  WBC 14.6* 16.2*  HGB 13.2 11.4*  HCT 39.2 34.5*  PLT 172 178   CMET CMP     Component Value Date/Time   NA 142 09/25/2013 0602   K 4.3 09/25/2013 0602   CL 104 09/25/2013 0602   CO2 25 09/25/2013 0602   GLUCOSE 125* 09/25/2013 0602   BUN 17 09/25/2013 0602   CREATININE 0.88 09/25/2013 0602   CALCIUM 8.7 09/25/2013 0602   PROT 6.9 09/25/2013 0602   ALBUMIN 3.0* 09/25/2013 0602   AST 16 09/25/2013 0602   ALT 7 09/25/2013 0602   ALKPHOS 71 09/25/2013 0602   BILITOT 1.3* 09/25/2013 0602   GFRNONAA 78* 09/25/2013 0602   GFRAA >90 09/25/2013 0602    GFR Estimated Creatinine Clearance: 42 ml/min (by C-G formula based on Cr of 0.88). No results found for this basename: LIPASE, AMYLASE,  in the last 72 hours  Recent Labs  09/24/13 1215 09/24/13 1840 09/24/13 2351  TROPONINI <0.30 <0.30 <0.30   No components found with this basename: POCBNP,  No results found for this basename: DDIMER,  in the last 72 hours No results found for this basename: HGBA1C,  in the last 72 hours No results found for this basename: CHOL, HDL, LDLCALC, TRIG, CHOLHDL, LDLDIRECT,  in the last 72 hours No results found for this basename: TSH, T4TOTAL,  FREET3, T3FREE, THYROIDAB,  in the last 72 hours No results found for this basename: VITAMINB12, FOLATE, FERRITIN, TIBC, IRON, RETICCTPCT,  in the last 72 hours Coags: No results found for this basename: PT, INR,  in the last 72 hours Microbiology: No results found for this or any previous visit (from the past 240 hour(s)).   BRIEF HOSPITAL COURSE:  Acute on chronic respiratory failure  - Secondary to pneumonia and COPD exacerbation.  - Treated with Rocephin Zithromax, IV Solu-Medrol, scheduled meds, significantly better at discharge. Per patient he is now back to usual baseline. He has ambulated in the hallway, feels that his shortness of breath at baseline.  CAP (community acquired pneumonia)  - Patient was  admitted, started on Rocephin and Zithromax. Rapidly improved, will transition to levofloxacin on discharge.  COPD with acute exacerbation  - Lungs completely clear this morning. Patient claims he is breathing significantly better and very close to his usual baseline.  - Was admitted and started on IV Solu-Medrol, scheduled nebulized bronchodilators, on discharge he will be placed on a prednisone taper. We'll continue his regular inhalers, nebs as needed. -Followup appointment with Dr. Chase Caller has been arranged for February 13 at 4:15 PM.  BPH  - Continue with [facility]  TODAY-DAY OF DISCHARGE:  Subjective:   Frank Garcia today has no headache,no chest abdominal pain,no new weakness tingling or numbness, feels much better wants to go home today.   Objective:   Blood pressure 138/71, pulse 93, temperature 97.9 F (36.6 C), temperature source Oral, resp. rate 20, height 5\' 7"  (1.702 m), weight 45.949 kg (101 lb 4.8 oz), SpO2 99.00%.  Intake/Output Summary (Last 24 hours) at 09/26/13 1044 Last data filed at 09/26/13 0927  Gross per 24 hour  Intake    480 ml  Output    350 ml  Net    130 ml   Filed Weights   09/24/13 1150  Weight: 45.949 kg (101 lb 4.8 oz)     Exam Awake Alert, Oriented *3, No new F.N deficits, Normal affect [place].AT,PERRAL Supple Neck,No JVD, No cervical lymphadenopathy appriciated.  Symmetrical Chest wall movement, Good air movement bilaterally, CTAB RRR,No Gallops,Rubs or new Murmurs, No Parasternal Heave +ve B.Sounds, Abd Soft, Non tender, No organomegaly appriciated, No rebound -guarding or rigidity. No Cyanosis, Clubbing or edema, No new Rash or bruise  DISCHARGE CONDITION: Stable  DISPOSITION: Home  DISCHARGE INSTRUCTIONS:    Activity:  As tolerated with Full fall precautions use walker/cane & assistance as needed  Diet recommendation:  Heart Healthy diet   Discharge Orders   Future Appointments Provider Department Dept Phone   10/10/2013 4:15 PM Brand Males, MD [facility] [phone]   Future Orders Complete By Expires   Call MD for:  difficulty breathing, headache or visual disturbances  As directed    Diet - low sodium heart healthy  As directed    Increase activity slowly  As directed       Follow-up Information   Follow up with [facility], MD. Schedule an appointment as soon as possible for a visit on 10/10/2013. (appointment at 4:15 pm)    Specialty:  Pulmonary Disease   Contact information:   [place] [address] [phone]         Total Time spent on discharge equals 45 minutes.  SignedOren Binet 09/26/2013 10:44 AM

## 2013-09-26 NOTE — Progress Notes (Addendum)
Nsg Discharge Note  Admit Date:  09/24/2013 Discharge date: 09/26/2013   Frank Garcia to be D/C'd Home per MD order.  AVS completed.  Copy for chart, and copy for patient signed, and dated. Patient/caregiver able to verbalize understanding.  Discharge Medication:   Medication List         albuterol (2.5 MG/3ML) 0.083% nebulizer solution  Commonly known as:  PROVENTIL  Take 3 mLs (2.5 mg total) by nebulization every 2 (two) hours as needed for wheezing or shortness of breath.     albuterol 108 (90 BASE) MCG/ACT inhaler  Commonly known as:  PROVENTIL HFA;VENTOLIN HFA  Inhale 2 puffs into the lungs every 4 (four) hours as needed for wheezing or shortness of breath.     ENSURE HIGH PROTEIN PO  Take 1 Can by mouth daily.     Fluticasone Furoate-Vilanterol 100-25 MCG/INH Aepb  Commonly known as:  BREO ELLIPTA  Inhale 1 puff into the lungs daily.     guaiFENesin 600 MG 12 hr tablet  Commonly known as:  MUCINEX  Take 1 tablet (600 mg total) by mouth 2 (two) times daily.     levofloxacin 750 MG tablet  Commonly known as:  LEVAQUIN  Take 1 tablet (750 mg total) by mouth daily.     predniSONE 1 MG tablet  Commonly known as:  DELTASONE  - Take 60 mg (6 tablets) for 2 days,then,  - Take 50 mg (5 tablets) for 2 days,then,  - Take 40 mg (4 tablets) for 2 days,then,  - Take 30 mg (3 tablets) for 2 days,then,  - Take 20 mg (2 tablets) for 2 days,then,  - Take 10 mg (1 tablet) for 2 days,then-stop     RAPAFLO 8 MG Caps capsule  Generic drug:  silodosin  Take 1 capsule by mouth daily.     TUDORZA PRESSAIR 400 MCG/ACT Aepb  Generic drug:  Aclidinium Bromide  Inhale 1 puff into the lungs 2 (two) times daily.        Discharge Assessment: Filed Vitals:   09/26/13 0449  BP: 138/71  Pulse: 93  Temp: 97.9 F (36.6 C)  Resp: 20   Skin clean, dry and intact without evidence of skin break down, no evidence of skin tears noted. IV catheter discontinued intact. Site without  signs and symptoms of complications - no redness or edema noted at insertion site, patient denies c/o pain - only slight tenderness at site.  Dressing with slight pressure applied.  D/c Instructions-Education: Discharge instructions given to patient/family with verbalized understanding. D/c education completed with patient/family including follow up instructions, medication list, d/c activities limitations if indicated, with other d/c instructions as indicated by MD - patient able to verbalize understanding, all questions fully answered. Patient instructed to return to ED, call 911, or call MD for any changes in condition.  Patient escorted via [place], and D/C home via private auto.  [name], RN 09/26/2013 11:48 AM

## 2013-10-10 ENCOUNTER — Ambulatory Visit (INDEPENDENT_AMBULATORY_CARE_PROVIDER_SITE_OTHER): Payer: Medicare Other | Admitting: Internal Medicine

## 2013-10-10 ENCOUNTER — Encounter: Payer: Self-pay | Admitting: Internal Medicine

## 2013-10-10 VITALS — BP 120/80 | HR 82 | Ht 67.0 in | Wt 110.0 lb

## 2013-10-10 DIAGNOSIS — J9611 Chronic respiratory failure with hypoxia: Secondary | ICD-10-CM

## 2013-10-10 DIAGNOSIS — J189 Pneumonia, unspecified organism: Secondary | ICD-10-CM

## 2013-10-10 DIAGNOSIS — J449 Chronic obstructive pulmonary disease, unspecified: Secondary | ICD-10-CM

## 2013-10-10 DIAGNOSIS — R0902 Hypoxemia: Secondary | ICD-10-CM

## 2013-10-10 DIAGNOSIS — J9610 Chronic respiratory failure, unspecified whether with hypoxia or hypercapnia: Secondary | ICD-10-CM

## 2013-10-10 NOTE — Assessment & Plan Note (Signed)
Fu cxr in 4 weeks

## 2013-10-10 NOTE — Patient Instructions (Addendum)
#  COPD with chronic respiratory failure - currently stable but you are getting too many flare ups and this is worrisome - I wil have my research coordinator screen you for some copd flare up studies - continue tudorza  Daily 1 puff twice daily; - continue  BREO 1  Puff daily -  take generic fluticasone inhaler 2 squirts each nostril daily - Start N. acetylcysteine 600 mg twice a day   You are more likely to get this drug at [facility] or a vitamin store then at [employer] or [employer] or target  - You can also get this at website [employer]  #FOllowup 4 weeks with COPD CAT score with NP or me  - cxr needed at time of followup  - at followup depending on progress, consider chnaging BREO to something else in view of pneumonia that followed starting breo

## 2013-10-10 NOTE — Progress Notes (Signed)
Subjective:    Patient ID: Frank Garcia, male    DOB: [birth year], 80 y.o.   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  - Xpresys indi test done 04/29/13 is LIKELY BENGIN for nodule at 86% probability - 09/01/13 - stabl bilateral UL scarring. REC: annual low dose CT  # ex-smoker,   #Cachexia it was for the last 1 month she's having increased cough with increased white mucus production but no increase in chest tightness or shortness of breath. Overall COPD cat score has gotten worse  - Body mass index is 18.17 kg/(m^2). on 11/13/2011 (weight 116# and up 5# since June 2012)  - - Body mass index is 16.94 kg/(m^2). on 06/09/2013   #Shingles - MArch 2013     #Recurrent COPD exacerbation - March 2013: Office treatment for COPD exacerbation - May 2013: Office treatment with doxycycline and prednisone for COPD exacerbation  - August 2030: Started roflumilast May 2-14 - office RX Possible COPDAE./ Rx doxy and pred Dec 2014 - office Rx Prdnisone and Augmentin. STopped roflumilast  #hoarse voice - ENT evaluation and 2013 normal    OV 10/10/2013  Follow up COPD with chronic respiratory failure. Recurrent COPD exacerbation   At last visit 09/01/2013 I changed his Advair to [place] and stopped roflumilast as it did not seem to help. . I had him continue tudorza. Subsequently 09/16/2013 he got admitted with left lower lobe pneumonia and a COPD  exacerbation. He was hospitalized for 2 days. He is presenting for followup. He is improved significantly but still is deconditioned. Symptoms almost back to baseline. Current phlegm is white in color. COPD cat score is 22 and refer to baseline symptomatology. He is frustrated that he is getting recurrent AECOPD. HE is realizing that this might be end of the road for him. HE iis compliant with tudorza, breo and o2.   We discussed COPD research participation in recurrent AECOPD He clearly understands this is not treatmnent and is research. Explained [place] and [place] [employer]. He is interested. Have emailed Kalman Shan our coordinator to discuss with him and screen him formally    CAT COPD Symptom & Quality of Life Score ([place]) 0 is no burden. 5 is highest burden 09/01/2013  10/10/2013   Never Cough -> Cough all the time 2 4  No phlegm in chest -> Chest is full of phlegm 2 2  No chest tightness -> Chest feels very tight 3 1  No dyspnea for 1 flight stairs/hill -> Very dyspneic for 1 flight of stairs 4 3  No limitations for ADL at home -> Very limited with ADL at home 3 3  Confident leaving home -> Not at all confident leaving home 3 2  Sleep soundly -> Do not sleep soundly because of lung condition 5 3  Lots  of Energy -> No energy at all 3 4  TOTAL Score (max 40)  25 22    Review of Systems  Constitutional: Negative for fever and unexpected weight change.  HENT: Negative for congestion, dental problem, ear pain, nosebleeds, postnasal drip, rhinorrhea, sinus pressure, sneezing, sore throat and trouble swallowing.   Eyes: Negative for redness and itching.  Respiratory: Positive for cough, chest tightness, shortness of breath and wheezing.   Cardiovascular: Negative for palpitations and leg swelling.  Gastrointestinal: Negative for nausea and vomiting.  Genitourinary: Negative for dysuria.  Musculoskeletal: Negative for joint swelling.  Skin: Negative for rash.  Neurological:  Negative for headaches.  Hematological: Does not bruise/bleed easily.  Psychiatric/Behavioral: Negative for dysphoric mood. The patient is not nervous/anxious.        Objective:   Physical Exam    Physical Exam  Nursing note and vitals reviewed. Constitutional: He is oriented to person, place, and time. He appears well-developed and well-nourished. No distress.  HENT:  Head: Normocephalic and atraumatic.  Right Ear: External ear normal.  Left Ear: External ear normal.  Mouth/Throat: Oropharynx is clear and moist. No oropharyngeal exudate.  Eyes: Conjunctivae and EOM are normal. Pupils are equal, round, and reactive to light. Right eye exhibits no discharge. Left eye exhibits no discharge. No scleral icterus.  Neck: Normal range of motion. Neck supple. No JVD present. No tracheal deviation present. No thyromegaly present.  Cardiovascular: Normal rate, regular rhythm and intact distal pulses.  Exam reveals no gallop and no friction rub.   No murmur heard. Pulmonary/Chest: Effort normal and breath sounds normal. No respiratory distress. He has no wheezes. He has no rales. He exhibits no tenderness.  Abdominal: Soft. Bowel sounds are normal. He exhibits no distension and no mass. There is no tenderness. There is no rebound and no guarding.  Musculoskeletal: Normal range of motion. He exhibits no tenderness.  Lymphadenopathy:    He has no cervical adenopathy.  Neurological: He is alert and oriented to person, place, and time. He has normal reflexes. No cranial nerve deficit. Coordination normal.  Skin: Skin is warm and dry. No rash noted. He is not diaphoretic. No erythema. No pallor.  Psychiatric: He has a normal mood and affect. His behavior is normal. Judgment and thought content normal.      Assessment & Plan:

## 2013-10-10 NOTE — Assessment & Plan Note (Signed)
#  COPD with chronic respiratory failure - currently stable but you are getting too many flare ups and this is worrisome - I wil have my research coordinator screen you for some copd flare up studies - continue tudorza  Daily 1 puff twice daily; - continue  BREO 1  Puff daily -  take generic fluticasone inhaler 2 squirts each nostril daily - Start N. acetylcysteine 600 mg twice a day   You are more likely to get this drug at [facility] or a vitamin store then at [employer] or [employer] or [employer]  - You can also get this at website [email or web address]  #FOllowup 4 weeks with COPD CAT score with NP or me  - cxr needed at time of followup  - at followup depending on progress, consider chnaging BREO to something else in view of pneumonia that followed starting breo

## 2013-10-27 ENCOUNTER — Telehealth: Payer: Self-pay | Admitting: Internal Medicine

## 2013-10-27 NOTE — Telephone Encounter (Signed)
Attempted to call [facility]. Line was busy. Will try back later.

## 2013-10-28 MED ORDER — ACLIDINIUM BROMIDE 400 MCG/ACT IN AEPB: 1.0000 | INHALATION_SPRAY | Freq: Two times a day (BID) | RESPIRATORY_TRACT | Status: DC

## 2013-10-28 NOTE — Telephone Encounter (Signed)
I spoke with pharmacy and they state they had computer issues yesterday and the pt tudorza rx was deleted so they are requesting another rx be sent. Rx sent. [place], CMA

## 2013-11-10 ENCOUNTER — Ambulatory Visit: Payer: Medicare Other | Admitting: Internal Medicine

## 2013-11-17 ENCOUNTER — Encounter: Payer: Self-pay | Admitting: Adult Health

## 2013-11-17 ENCOUNTER — Ambulatory Visit (INDEPENDENT_AMBULATORY_CARE_PROVIDER_SITE_OTHER): Payer: Medicare Other | Admitting: Adult Health

## 2013-11-17 VITALS — BP 134/74 | HR 70 | Temp 97.9°F | Ht 67.0 in | Wt 109.2 lb

## 2013-11-17 DIAGNOSIS — J449 Chronic obstructive pulmonary disease, unspecified: Secondary | ICD-10-CM

## 2013-11-17 DIAGNOSIS — J189 Pneumonia, unspecified organism: Secondary | ICD-10-CM

## 2013-11-17 NOTE — Patient Instructions (Signed)
Chest xray today .  Continue on current regimen  Sent order to DME for portable Oxygen tank .  Follow up Dr. Chase Caller in 2 months and As needed   Please contact office for sooner follow up if symptoms do not improve or worsen or seek emergency care

## 2013-11-18 NOTE — Assessment & Plan Note (Signed)
Compensated on present regimen  Will repeat cxr from recent PNA in January to verify clearance.  Pt wants a portable concentrator   Plan  Chest xray today .  Continue on current regimen  Sent order to DME for portable Oxygen tank .  Follow up Dr. Chase Caller in 2 months and As needed   Please contact office for sooner follow up if symptoms do not improve or worsen or seek emergency care

## 2013-11-18 NOTE — Progress Notes (Signed)
Subjective:    Patient ID: Frank Garcia, male    DOB: 09-Jan-1929, 80 y.o.   MRN: [id number]  HPI   #FU Gold stage 3 copd - MM genotype  - feb 2009, fev1 1.13L/46% while on spiriva and advair with ex-desat, - June 2012:  Spirometry today (reviewed) - fev1 0.95L/38% and is a 110cc loss in fev1 in 3 years which approx 35cc/year - CAT score 16 Jan 2012 - Dec 2014: spiriva changed to tudorza due to urinary retention - Jan 2015: advair changed to [facility]  # pulmonary nodules, - RUL apical scar like density  - 2010 and 2011 -absent  - 2012 and 2013 - new and stable -03/24/13  - progression to 1.6cm    -  Onncimmne test reesult 04/29/13 is LOW level for 7 ung cancer proteins.  - Xpresys indi test done 04/29/13 is LIKELY BENGIN for nodule at 86% probability - 09/01/13 - stabl bilateral UL scarring. REC: annual low dose CT  # ex-smoker,   #Cachexia it was for the last 1 month she's having increased cough with increased white mucus production but no increase in chest tightness or shortness of breath. Overall COPD cat score has gotten worse  - Body mass index is 18.17 kg/(m^2). on 11/13/2011 (weight 116# and up 5# since June 2012)  - - Body mass index is 16.94 kg/(m^2). on 06/09/2013   #Shingles - [facility]Arch 2013     #Recurrent COPD exacerbation - March 2013: Office treatment for COPD exacerbation - May 2013: Office treatment with doxycycline and prednisone for COPD exacerbation  - August 2030: Started roflumilast May 2-14 - office RX Possible COPDAE./ Rx doxy and pred Dec 2014 - office Rx Prdnisone and Augmentin. STopped roflumilast  #hoarse voice - ENT evaluation and 2013 normal    OV 10/10/2013 Follow up COPD with chronic respiratory failure. Recurrent COPD exacerbation At last visit 09/01/2013 I changed his Advair to [place] and stopped roflumilast as it did not seem to help. . I had him continue tudorza. Subsequently 09/16/2013 he got admitted with left lower lobe pneumonia and a COPD  exacerbation. He was hospitalized for 2 days. He is presenting for followup. He is improved significantly but still is deconditioned. Symptoms almost back to baseline. Current phlegm is white in color. COPD cat score is 22 and refer to baseline symptomatology. He is frustrated that he is getting recurrent AECOPD. HE is realizing that this might be end of the road for him. HE iis compliant with tudorza, breo and o2.   We discussed COPD research participation in recurrent AECOPD He clearly understands this is not treatmnent and is research. Explained [place] [employer] [place] [employer]. He is interested. Have emailed Kalman Shan our coordinator to discuss with him and screen him formally    CAT COPD Symptom & Quality of Life Score ([place]) 0 is no burden. 5 is highest burden 09/01/2013  10/10/2013   Never Cough -> Cough all the time 2 4  No phlegm in chest -> Chest is full of phlegm 2 2  No chest tightness -> Chest feels very tight 3 1  No dyspnea for 1 flight stairs/hill -> Very dyspneic for 1 flight of stairs 4 3  No limitations for ADL at home -> Very limited with ADL at home 3 3  Confident leaving home -> Not at all confident leaving home 3 2  Sleep soundly -> Do not sleep soundly because of lung condition 5 3  Lots of Energy ->  No energy at all 3 4  TOTAL Score (max 40)  25 22    11/18/2013 Follow up  This is a very sweet elderly gentleman that has known COPD. Reports breathing is doing well since last ov, cough is unchanged with mucus in throat.  no new complaints.   Most days is able to mucus out but is thick and sticky at times.  Wants to see if he can get portable concentrator.  Pt wife is in [place] , he goes to see her everyday.  Remains on BREO and TUDORZA.  Denies chest pain, orthopnea, edema or hemoptysis  Had PNA in January, cough improved , tx w/ abx Needs cxr to document clearance .       Review of Systems  Constitutional: Negative for fever and unexpected weight change.   HENT: Negative for congestion, dental problem, ear pain, nosebleeds, postnasal drip, rhinorrhea, sinus pressure, sneezing, sore throat and trouble swallowing.   Eyes: Negative for redness and itching.  Respiratory:  Neg for wheezing   Cardiovascular: Negative for palpitations and leg swelling.  Gastrointestinal: Negative for nausea and vomiting.  Genitourinary: Negative for dysuria.  Musculoskeletal: Negative for joint swelling.  Skin: Negative for rash.  Neurological: Negative for headaches.  Hematological: Does not bruise/bleed easily.  Psychiatric/Behavioral: Negative for dysphoric mood. The patient is not nervous/anxious.        Objective:   Physical Exam GEN: A/Ox3; pleasant , NAD, elderly, frail, thin  HEENT:  [place]/AT,  EACs-clear, TMs-wnl, NOSE-clear, THROAT-clear, no lesions, no postnasal drip or exudate noted.   NECK:  Supple w/ fair ROM; no JVD; normal carotid impulses w/o bruits; no thyromegaly or nodules palpated; no lymphadenopathy.  RESP  Diminished BS in bases no accessory muscle use, no dullness to percussion  CARD:  RRR, no m/r/g  , no peripheral edema, pulses intact, no cyanosis or clubbing.  GI:   Soft & nt; nml bowel sounds; no organomegaly or masses detected.  Musco: Warm bil, no deformities or joint swelling noted.   Neuro: alert, no focal deficits noted.    Skin: Warm, no lesions or rashes        Assessment & Plan:

## 2013-11-25 ENCOUNTER — Ambulatory Visit (INDEPENDENT_AMBULATORY_CARE_PROVIDER_SITE_OTHER)
Admission: RE | Admit: 2013-11-25 | Discharge: 2013-11-25 | Disposition: A | Discharge status: Home / Self Care | Payer: Medicare Other | Source: Ambulatory Visit | Attending: Adult Health | Admitting: Adult Health

## 2013-11-25 DIAGNOSIS — J189 Pneumonia, unspecified organism: Secondary | ICD-10-CM

## 2013-11-26 NOTE — Progress Notes (Signed)
Quick Note:  LM with family member Loma Sousa to have pt call back. ______

## 2013-12-25 ENCOUNTER — Telehealth: Payer: Self-pay | Admitting: Internal Medicine

## 2013-12-25 NOTE — Telephone Encounter (Signed)
Received Rx refill request for Breo 1 puff qd for 90 day supply. Called [facility] on [address] to change current script from 30 day supply to 90 day supply with 1 refill.

## 2013-12-30 ENCOUNTER — Inpatient Hospital Stay (HOSPITAL_COMMUNITY)
Admission: EM | Admit: 2013-12-30 | Discharge: 2014-01-01 | DRG: 193 | Disposition: A | Discharge status: Home / Self Care | Payer: Medicare Other | Attending: Internal Medicine | Admitting: Internal Medicine

## 2013-12-30 ENCOUNTER — Emergency Department (HOSPITAL_COMMUNITY): Payer: Medicare Other

## 2013-12-30 ENCOUNTER — Encounter (HOSPITAL_COMMUNITY): Payer: Self-pay | Admitting: Emergency Medicine

## 2013-12-30 DIAGNOSIS — I2584 Coronary atherosclerosis due to calcified coronary lesion: Secondary | ICD-10-CM

## 2013-12-30 DIAGNOSIS — Z87891 Personal history of nicotine dependence: Secondary | ICD-10-CM

## 2013-12-30 DIAGNOSIS — R55 Syncope and collapse: Secondary | ICD-10-CM

## 2013-12-30 DIAGNOSIS — R490 Dysphonia: Secondary | ICD-10-CM

## 2013-12-30 DIAGNOSIS — R3911 Hesitancy of micturition: Secondary | ICD-10-CM

## 2013-12-30 DIAGNOSIS — E43 Unspecified severe protein-calorie malnutrition: Secondary | ICD-10-CM

## 2013-12-30 DIAGNOSIS — N400 Enlarged prostate without lower urinary tract symptoms: Secondary | ICD-10-CM | POA: Diagnosis present

## 2013-12-30 DIAGNOSIS — J154 Pneumonia due to other streptococci: Principal | ICD-10-CM | POA: Diagnosis present

## 2013-12-30 DIAGNOSIS — R42 Dizziness and giddiness: Secondary | ICD-10-CM

## 2013-12-30 DIAGNOSIS — R911 Solitary pulmonary nodule: Secondary | ICD-10-CM

## 2013-12-30 DIAGNOSIS — A491 Streptococcal infection, unspecified site: Secondary | ICD-10-CM

## 2013-12-30 DIAGNOSIS — J9611 Chronic respiratory failure with hypoxia: Secondary | ICD-10-CM

## 2013-12-30 DIAGNOSIS — R0601 Orthopnea: Secondary | ICD-10-CM

## 2013-12-30 DIAGNOSIS — Z9981 Dependence on supplemental oxygen: Secondary | ICD-10-CM

## 2013-12-30 DIAGNOSIS — J441 Chronic obstructive pulmonary disease with (acute) exacerbation: Secondary | ICD-10-CM

## 2013-12-30 DIAGNOSIS — J189 Pneumonia, unspecified organism: Secondary | ICD-10-CM

## 2013-12-30 DIAGNOSIS — R918 Other nonspecific abnormal finding of lung field: Secondary | ICD-10-CM

## 2013-12-30 DIAGNOSIS — R339 Retention of urine, unspecified: Secondary | ICD-10-CM

## 2013-12-30 DIAGNOSIS — J449 Chronic obstructive pulmonary disease, unspecified: Secondary | ICD-10-CM

## 2013-12-30 DIAGNOSIS — I4460 Unspecified fascicular block: Secondary | ICD-10-CM | POA: Diagnosis present

## 2013-12-30 DIAGNOSIS — I2510 Atherosclerotic heart disease of native coronary artery without angina pectoris: Secondary | ICD-10-CM | POA: Diagnosis present

## 2013-12-30 DIAGNOSIS — R638 Other symptoms and signs concerning food and fluid intake: Secondary | ICD-10-CM

## 2013-12-30 DIAGNOSIS — J3489 Other specified disorders of nose and nasal sinuses: Secondary | ICD-10-CM

## 2013-12-30 DIAGNOSIS — J9610 Chronic respiratory failure, unspecified whether with hypoxia or hypercapnia: Secondary | ICD-10-CM | POA: Diagnosis present

## 2013-12-30 LAB — BASIC METABOLIC PANEL
BUN: 12 mg/dL (ref 6–23)
CHLORIDE: 101 meq/L (ref 96–112)
CO2: 27 mEq/L (ref 19–32)
CREATININE: 0.78 mg/dL (ref 0.50–1.35)
Calcium: 9.5 mg/dL (ref 8.4–10.5)
GFR calc Af Amer: >90 mL/min (ref >90)
GFR calc non Af Amer: 82 mL/min — ABNORMAL LOW (ref >90)
Glucose, Bld: 107 mg/dL — ABNORMAL HIGH (ref 70–99)
Potassium: 4.1 mEq/L (ref 3.7–5.3)
Sodium: 141 mEq/L (ref 137–147)

## 2013-12-30 LAB — CBC WITH DIFFERENTIAL/PLATELET
BASOS PCT: 0 % (ref 0–1)
Basophils Absolute: 0 10*3/uL (ref 0.0–0.1)
Eosinophils Absolute: 0 10*3/uL (ref 0.0–0.7)
Eosinophils Relative: 0 % (ref 0–5)
HCT: 39.1 % (ref 39.0–52.0)
HEMOGLOBIN: 13 g/dL (ref 13.0–17.0)
LYMPHS ABS: 0.5 10*3/uL — AB (ref 0.7–4.0)
LYMPHS PCT: 3 % — AB (ref 12–46)
MCH: 27.7 pg (ref 26.0–34.0)
MCHC: 33.2 g/dL (ref 30.0–36.0)
MCV: 83.4 fL (ref 78.0–100.0)
MONOS PCT: 5 % (ref 3–12)
Monocytes Absolute: 1 10*3/uL (ref 0.1–1.0)
NEUTROS PCT: 92 % — AB (ref 43–77)
Neutro Abs: 17 10*3/uL — ABNORMAL HIGH (ref 1.7–7.7)
Platelets: 228 10*3/uL (ref 150–400)
RBC: 4.69 MIL/uL (ref 4.22–5.81)
RDW: 15 % (ref 11.5–15.5)
WBC: 18.5 10*3/uL — ABNORMAL HIGH (ref 4.0–10.5)

## 2013-12-30 LAB — I-STAT VENOUS BLOOD GAS, ED
Bicarbonate: 25.6 mEq/L — ABNORMAL HIGH (ref 20.0–24.0)
O2 SAT: 76 %
PH VEN: 7.366 — AB (ref 7.250–7.300)
PO2 VEN: 42 mmHg (ref 30.0–45.0)
TCO2: 27 mmol/L (ref 0–100)
pCO2, Ven: 44.7 mmHg — ABNORMAL LOW (ref 45.0–50.0)

## 2013-12-30 LAB — I-STAT TROPONIN, ED: TROPONIN I, POC: 0 ng/mL (ref 0.00–0.08)

## 2013-12-30 LAB — PRO B NATRIURETIC PEPTIDE: Pro B Natriuretic peptide (BNP): 59.7 pg/mL (ref 0–450)

## 2013-12-30 MED ORDER — DEXTROSE 5 % IV SOLN
500.0000 mg | Freq: Once | INTRAVENOUS | Status: AC
  Administered 2013-12-31: 500 mg via INTRAVENOUS

## 2013-12-30 MED ORDER — DEXTROSE 5 % IV SOLN
1.0000 g | Freq: Once | INTRAVENOUS | Status: AC
  Administered 2013-12-31: 1 g via INTRAVENOUS
  Filled 2013-12-30: qty 10

## 2013-12-30 MED ORDER — ALBUTEROL (5 MG/ML) CONTINUOUS INHALATION SOLN
15.0000 mg/h | INHALATION_SOLUTION | Freq: Once | RESPIRATORY_TRACT | Status: AC
  Administered 2013-12-30: 15 mg/h via RESPIRATORY_TRACT
  Filled 2013-12-30: qty 20

## 2013-12-30 MED ORDER — IPRATROPIUM BROMIDE 0.02 % IN SOLN
0.5000 mg | Freq: Once | RESPIRATORY_TRACT | Status: AC
  Administered 2013-12-30: 0.5 mg via RESPIRATORY_TRACT
  Filled 2013-12-30: qty 2.5

## 2013-12-30 MED ORDER — ALBUTEROL (5 MG/ML) CONTINUOUS INHALATION SOLN: 10.0000 mg/h | INHALATION_SOLUTION | Freq: Once | RESPIRATORY_TRACT | Status: DC

## 2013-12-30 MED ORDER — METHYLPREDNISOLONE SODIUM SUCC 125 MG IJ SOLR
125.0000 mg | Freq: Once | INTRAMUSCULAR | Status: AC
  Administered 2013-12-30: 125 mg via INTRAVENOUS
  Filled 2013-12-30: qty 2

## 2013-12-30 NOTE — ED Provider Notes (Signed)
CSN: [id number]     Arrival date & time 12/30/13  2222 History   First MD Initiated Contact with Patient 12/30/13 2234     Chief Complaint  Patient presents with  . Shortness of Breath     (Consider location/radiation/quality/duration/timing/severity/associated sxs/prior Treatment) The history is provided by the patient.   history of present illness: 80 year-old male with history of COPD who presents with chief complaint of shortness of breath. Onset of symptoms was today. Patient wears oxygen at night and intermittently during the day as needed. He has had to wear his oxygen all day today without relief of symptoms. He took his albuterol with no improvement in symptoms. He denies any recent fevers or chest pain. He has had several days of increased cough that is intermittently productive of whitish sputum.  Past Medical History  Diagnosis Date  . Chronic airway obstruction, not elsewhere classified     on home O2 at night  . BPH (benign prostatic hypertrophy)   . Tobacco abuse   . Weight loss   . Shingles rash 11/13/2011  . Shortness of breath    Past Surgical History  Procedure Laterality Date  . No past surgeries     Family History  Problem Relation Age of Onset  . COPD Father    History  Substance Use Topics  . Smoking status: Former Smoker -- 1.50 packs/day for 40 years    Types: Cigarettes    Quit date: 08/29/2007  . Smokeless tobacco: Never Used  . Alcohol Use: No    Review of Systems  Constitutional: Negative for fever and chills.  HENT: Negative.   Eyes: Negative.   Respiratory: Positive for cough, shortness of breath and wheezing.   Cardiovascular: Negative for chest pain, palpitations and leg swelling.  Gastrointestinal: Negative for nausea, vomiting, abdominal pain, diarrhea and constipation.  Genitourinary: Negative.   Musculoskeletal: Negative.   Skin: Negative.   Neurological: Negative.   All other systems reviewed and are negative.     Allergies   Review of patient's allergies indicates no known allergies.  Home Medications   Prior to Admission medications   Medication Sig Start Date End Date Taking? Authorizing Provider  Aclidinium Bromide (TUDORZA PRESSAIR) 400 MCG/ACT AEPB Inhale 1 puff into the lungs 2 (two) times daily. 10/28/13  Yes Brand Males, MD  albuterol (PROVENTIL HFA;VENTOLIN HFA) 108 (90 BASE) MCG/ACT inhaler Inhale 2 puffs into the lungs every 4 (four) hours as needed for wheezing or shortness of breath.   Yes Historical Provider, MD  Fluticasone Furoate-Vilanterol (BREO ELLIPTA) 100-25 MCG/INH AEPB Inhale 1 puff into the lungs daily. 12/25/13  Yes Brand Males, MD  Nutritional Supplements (ENSURE HIGH PROTEIN PO) Take 1 Can by mouth daily.    Yes Historical Provider, MD  albuterol (PROVENTIL) (2.5 MG/3ML) 0.083% nebulizer solution Take 3 mLs (2.5 mg total) by nebulization every 2 (two) hours as needed for wheezing or shortness of breath. 09/26/13   Shanker Kristeen Mans, MD   BP 139/71  Pulse 98  Temp(Src) 98.9 F (37.2 C) (Oral)  Resp 31  Ht 5' 7.5" (1.715 m)  Wt 107 lb (48.535 kg)  BMI 16.50 kg/m2  SpO2 94% Physical Exam  Nursing note and vitals reviewed. Constitutional: He is oriented to person, place, and time. He appears well-developed and well-nourished. No distress. Nasal cannula in place.  HENT:  Head: Normocephalic and atraumatic.  Eyes: Conjunctivae are normal.  Neck: Neck supple.  Cardiovascular: Normal rate, regular rhythm, normal heart sounds and intact  distal pulses.   Pulmonary/Chest: Effort normal. He has decreased breath sounds (diminished throughout). He has wheezes (end expiratory). He has no rales.  Abdominal: Soft. He exhibits no distension. There is no tenderness.  Musculoskeletal: Normal range of motion.  Neurological: He is alert and oriented to person, place, and time.  Skin: Skin is warm and dry.    ED Course  Procedures (including critical care time) Labs Review Labs Reviewed   CBC WITH DIFFERENTIAL  PRO B NATRIURETIC PEPTIDE  BASIC METABOLIC PANEL  I-STAT [place], ED    Imaging Review Dg Chest Port 1 View  12/30/2013   CLINICAL DATA:  Cough, shortness of breath  EXAM: PORTABLE CHEST - 1 VIEW  COMPARISON:  DG CHEST 2 VIEW dated 11/25/2013  FINDINGS: The lungs are hyperinflated likely secondary to COPD. There is left lower lobe airspace disease. The lungs are otherwise clear. No pleural effusion or pneumothorax. The heart and mediastinal contours are unremarkable.  The osseous structures are unremarkable.  IMPRESSION: Left lower lobe airspace disease which may reflect atelectasis versus pneumonia.   Electronically Signed   By: Kathreen Devoid   On: 12/30/2013 23:00     EKG Interpretation None      MDM   Final diagnoses:  None    80 year old male with history of COPD presents with several days of worsening cough and one day of worsening dyspnea. Tachypnea with vital signs otherwise stable presentation. Normal O2 sat on baseline 2 L nasal cannula to Exam with diminished breath sounds throughout and end expiratory wheezing.  Presentation concerning for COPD exacerbation or pneumonia. Patient given Solu-Medrol, albuterol and ipratropium.  Leukocytosis of 18,000. Chest x-ray with concern for possible left lower lobe opacity. Given patient's increased frequency of cough and productive cough will give Rocephin and azithromycin to cover for community acquired pneumonia.  Hospitalist consult it and will admit for further management.  Renaldo Reel, MD 12/31/13 [phone]

## 2013-12-30 NOTE — ED Notes (Signed)
Pt reports increased shortness of breath and difficulty breathing - pt acutely anxious and requesting breathing treatment be stopped. Pt sitting up on side of bed, tripod position - increased work of breathing noted - RT notified and Dr. Modena Nunnery requested at bedside. Pt placed on 4L/min [place] per pt request.

## 2013-12-30 NOTE — ED Notes (Signed)
Per EMS - pt from home, pt w/ hx of COPD - approx 1600 today pt began experiencing shortness of breath, pt w/ PRN oxygen at home. Pt admits to improvement w/ O2 applied. Pt w/ nonproductive cough which is chronic for pt. Pt denies any pain at present, pt w/ clear lung sounds bilat for EMS.

## 2013-12-31 ENCOUNTER — Encounter (HOSPITAL_COMMUNITY): Payer: Self-pay | Admitting: *Deleted

## 2013-12-31 DIAGNOSIS — J441 Chronic obstructive pulmonary disease with (acute) exacerbation: Secondary | ICD-10-CM | POA: Diagnosis present

## 2013-12-31 DIAGNOSIS — I2584 Coronary atherosclerosis due to calcified coronary lesion: Secondary | ICD-10-CM

## 2013-12-31 DIAGNOSIS — J189 Pneumonia, unspecified organism: Secondary | ICD-10-CM

## 2013-12-31 DIAGNOSIS — J449 Chronic obstructive pulmonary disease, unspecified: Secondary | ICD-10-CM

## 2013-12-31 LAB — LEGIONELLA ANTIGEN, URINE: Legionella Antigen, Urine: NEGATIVE

## 2013-12-31 LAB — BASIC METABOLIC PANEL
BUN: 12 mg/dL (ref 6–23)
CALCIUM: 9.3 mg/dL (ref 8.4–10.5)
CO2: 23 mEq/L (ref 19–32)
CREATININE: 0.76 mg/dL (ref 0.50–1.35)
Chloride: 98 mEq/L (ref 96–112)
GFR calc non Af Amer: 83 mL/min — ABNORMAL LOW (ref >90)
Glucose, Bld: 197 mg/dL — ABNORMAL HIGH (ref 70–99)
Potassium: 3.8 mEq/L (ref 3.7–5.3)
Sodium: 137 mEq/L (ref 137–147)

## 2013-12-31 LAB — CBC WITH DIFFERENTIAL/PLATELET
BASOS ABS: 0 10*3/uL (ref 0.0–0.1)
BASOS PCT: 0 % (ref 0–1)
Eosinophils Absolute: 0 10*3/uL (ref 0.0–0.7)
Eosinophils Relative: 0 % (ref 0–5)
HCT: 38.4 % — ABNORMAL LOW (ref 39.0–52.0)
Hemoglobin: 13 g/dL (ref 13.0–17.0)
Lymphocytes Relative: 1 % — ABNORMAL LOW (ref 12–46)
Lymphs Abs: 0.2 10*3/uL — ABNORMAL LOW (ref 0.7–4.0)
MCH: 28 pg (ref 26.0–34.0)
MCHC: 33.9 g/dL (ref 30.0–36.0)
MCV: 82.6 fL (ref 78.0–100.0)
Monocytes Absolute: 0.5 10*3/uL (ref 0.1–1.0)
Monocytes Relative: 2 % — ABNORMAL LOW (ref 3–12)
NEUTROS ABS: 21.8 10*3/uL — AB (ref 1.7–7.7)
NEUTROS PCT: 97 % — AB (ref 43–77)
PLATELETS: 234 10*3/uL (ref 150–400)
RBC: 4.65 MIL/uL (ref 4.22–5.81)
RDW: 14.9 % (ref 11.5–15.5)
WBC: 22.5 10*3/uL — ABNORMAL HIGH (ref 4.0–10.5)

## 2013-12-31 LAB — STREP PNEUMONIAE URINARY ANTIGEN: Strep Pneumo Urinary Antigen: POSITIVE — AB

## 2013-12-31 MED ORDER — ENOXAPARIN SODIUM 40 MG/0.4ML ~~LOC~~ SOLN
40.0000 mg | SUBCUTANEOUS | Status: DC
  Administered 2013-12-31: 40 mg via SUBCUTANEOUS
  Filled 2013-12-31 (×2): qty 0.4

## 2013-12-31 MED ORDER — ENSURE COMPLETE PO LIQD
237.0000 mL | Freq: Two times a day (BID) | ORAL | Status: DC
  Administered 2013-12-31 – 2014-01-01 (×3): 237 mL via ORAL

## 2013-12-31 MED ORDER — LEVALBUTEROL HCL 1.25 MG/0.5ML IN NEBU
1.2500 mg | INHALATION_SOLUTION | Freq: Four times a day (QID) | RESPIRATORY_TRACT | Status: DC | PRN
  Filled 2013-12-31: qty 0.5

## 2013-12-31 MED ORDER — METHYLPREDNISOLONE SODIUM SUCC 125 MG IJ SOLR
80.0000 mg | Freq: Two times a day (BID) | INTRAMUSCULAR | Status: DC
  Administered 2013-12-31 – 2014-01-01 (×3): 80 mg via INTRAVENOUS
  Filled 2013-12-31 (×6): qty 1.28

## 2013-12-31 MED ORDER — DEXTROSE 5 % IV SOLN
500.0000 mg | INTRAVENOUS | Status: DC
  Administered 2013-12-31: 500 mg via INTRAVENOUS
  Filled 2013-12-31 (×2): qty 500

## 2013-12-31 MED ORDER — DEXTROSE 5 % IV SOLN
1.0000 g | INTRAVENOUS | Status: DC
  Administered 2013-12-31: 1 g via INTRAVENOUS
  Filled 2013-12-31 (×2): qty 10

## 2013-12-31 NOTE — Progress Notes (Signed)
Utilization Review Completed.Neoma Laming T Dowell5/01/2014

## 2013-12-31 NOTE — ED Provider Notes (Signed)
I saw and evaluated the patient, reviewed the resident's note and I agree with the findings and plan.   EKG Interpretation None      Gen:  AOx3 HEENT: Normocephalic, atraumatic Card:  RRR, no m/r/g Pulm: Coarse BS, scant wheezing noted Abd: Soft nontender  Patient with history of COPD presents with shortness of breath.  In no respiratory distress on my evaluation. Reports having wears oxygen more today and reports productive cough.  Concern for pneumonia. Patient was given steroids and nebulizer treatment. Treated for community-acquired pneumonia. Chest x-ray is concerning for pneumonia. Given patient's age and port score, will admit for further management.    Merryl Hacker, MD 12/31/13 [phone]

## 2013-12-31 NOTE — Care Management Note (Signed)
    Page 1 of 1   01/01/2014     3:29:45 PM CARE MANAGEMENT NOTE 01/01/2014  Patient:  Frank Garcia, Frank Garcia   Account Number:  [id number]  Date Initiated:  12/31/2013  Documentation initiated by:  Joanne Salah  Subjective/Objective Assessment:   Pt adm on 12/30/13 with strep PNA.  PTA, pt independent of ADLs and on chronic home oxygen.     Action/Plan:   Will follow for dc needs as pt progresses.  Will discuss PCP follow up with pt, as he currently has no PCP.   Anticipated DC Date:  01/03/2014   Anticipated DC Plan:  [place]  CM consult      Choice offered to / List presented to:     DME arranged  [facility]      DME agency  [facility].        Status of service:  Completed, signed off Medicare Important Message given?  NA - LOS <3 / Initial given by admissions (If response is "NO", the following Medicare IM given date fields will be blank) Date Medicare IM given:  01/01/2014 Date Additional Medicare IM given:    Discharge Disposition:  HOME/SELF CARE  Per UR Regulation:  Reviewed for med. necessity/level of care/duration of stay  If discussed at [place] of Stay Meetings, dates discussed:    Comments:  01/01/14 Ellan Lambert, RN, BSN [phone] Pt for dc home today.  On home O2 prior to admission, but only using at 2l/[place] prn and at night.  MD now ordering continuously at 3l/[place].  [place] notified of liter flow change and need for cont oxygen.  Portable tank brought to pt's room from [facility] for transport home.

## 2013-12-31 NOTE — ED Notes (Signed)
Transporting patient to new room assignment. 

## 2013-12-31 NOTE — H&P (Signed)
Chief Complaint:  sob  HPI: 80 yo male h/o copd on 2.5 L [place] oxygen qhs and prn daily comes in with several days of worsening sob and wheezing along with a productive cough.  No fevers.  Pt says last hospitalization was in [place] for copd and he was on steroids then.  He has not had to be on steroids since January.  No swelling in his legs.  He feels better since arrival to ED, with nebs and steroids.  He has not been on abx lately either.  Has infiltrate on pna.  Review of Systems:  Positive and negative as per HPI otherwise all other systems are negative  Past Medical History: Past Medical History  Diagnosis Date  . Chronic airway obstruction, not elsewhere classified     on home O2 at night  . BPH (benign prostatic hypertrophy)   . Tobacco abuse   . Weight loss   . Shingles rash 11/13/2011  . Shortness of breath    Past Surgical History  Procedure Laterality Date  . No past surgeries      Medications: Prior to Admission medications   Medication Sig Start Date End Date Taking? Authorizing Provider  Aclidinium Bromide (TUDORZA PRESSAIR) 400 MCG/ACT AEPB Inhale 1 puff into the lungs 2 (two) times daily. 10/28/13  Yes Brand Males, MD  albuterol (PROVENTIL HFA;VENTOLIN HFA) 108 (90 BASE) MCG/ACT inhaler Inhale 2 puffs into the lungs every 4 (four) hours as needed for wheezing or shortness of breath.   Yes Historical Provider, MD  Fluticasone Furoate-Vilanterol (BREO ELLIPTA) 100-25 MCG/INH AEPB Inhale 1 puff into the lungs daily. 12/25/13  Yes Brand Males, MD  Nutritional Supplements (ENSURE HIGH PROTEIN PO) Take 1 Can by mouth daily.    Yes Historical Provider, MD  albuterol (PROVENTIL) (2.5 MG/3ML) 0.083% nebulizer solution Take 3 mLs (2.5 mg total) by nebulization every 2 (two) hours as needed for wheezing or shortness of breath. 09/26/13   Shanker Kristeen Mans, MD    Allergies:  No Known Allergies  Social History:  reports that he quit smoking about 6 years ago. His smoking  use included Cigarettes. He has a 60 pack-year smoking history. He has never used smokeless tobacco. He reports that he does not drink alcohol or use illicit drugs.  Family History: Family History  Problem Relation Age of Onset  . COPD Father     Physical Exam: Filed Vitals:   12/31/13 0030 12/31/13 0035 12/31/13 0052 12/31/13 0133  BP: 131/64 131/64  149/71  Pulse: 102 100 102 103  Temp:    98.2 F (36.8 C)  TempSrc:    Oral  Resp: 26 25 28 18   Height:      Weight:    48.5 kg (106 lb 14.8 oz)  SpO2: 100% 100% 100% 97%   General appearance: alert, cooperative and no distress Head: Normocephalic, without obvious abnormality, atraumatic Eyes: negative Nose: Nares normal. Septum midline. Mucosa normal. No drainage or sinus tenderness. Neck: no JVD and supple, symmetrical, trachea midline Lungs: diminished breath sounds bilaterally Heart: regular rate and rhythm, S1, S2 normal, no murmur, click, rub or gallop Abdomen: soft, non-tender; bowel sounds normal; no masses,  no organomegaly Extremities: extremities normal, atraumatic, no cyanosis or edema Pulses: 2+ and symmetric Skin: Skin color, texture, turgor normal. No rashes or lesions Neurologic: Grossly normal    Labs on Admission:   Recent Labs  12/30/13 2305  NA 141  K 4.1  CL 101  CO2 27  GLUCOSE 107*  BUN 12  CREATININE 0.78  CALCIUM 9.5    Recent Labs  12/30/13 2305  WBC 18.5*  NEUTROABS 17.0*  HGB 13.0  HCT 39.1  MCV 83.4  PLT 228    Radiological Exams on Admission: Dg Chest Port 1 View  12/30/2013   CLINICAL DATA:  Cough, shortness of breath  EXAM: PORTABLE CHEST - 1 VIEW  COMPARISON:  DG CHEST 2 VIEW dated 11/25/2013  FINDINGS: The lungs are hyperinflated likely secondary to COPD. There is left lower lobe airspace disease. The lungs are otherwise clear. No pleural effusion or pneumothorax. The heart and mediastinal contours are unremarkable.  The osseous structures are unremarkable.  IMPRESSION: Left  lower lobe airspace disease which may reflect atelectasis versus pneumonia.   Electronically Signed   By: Kathreen Devoid   On: 12/30/2013 23:00    Assessment/Plan  80 yo male with copde and CAP  Principal Problem:   Community acquired pneumonia-  pna pathway.  Iv rocephin and azithro.  Oxgyen.    Active Problems:   COPD, very severe   Coronary atherosclerosis due to calcified coronary lesion(414.4)   Chronic respiratory failure with hypoxia   COPD exacerbation-  Iv solumedrol along with xoponex nebs due to his tachycardia earlier from hour long albuterol neb.  Full code.  Discussed with pt in front of his daughter.  Wishes only temporary extreme measures, not prolonged.  Encouraged him to discuss this with his 4 children.  Full admit to tele.  Kalif Kattner A Shanon Brow 12/31/2013, 2:00 AM

## 2013-12-31 NOTE — Progress Notes (Signed)
Inpatient Diabetes Program Recommendations  AACE/ADA: New Consensus Statement on Inpatient Glycemic Control  Target Ranges:  Prepandial:   less than 140 mg/dL      Peak postprandial:   less than 180 mg/dL (1-2 hours)      Critically ill patients:  140 - 180 mg/dL  Pager:  [phone] Hours:  8 am-10pm   Reason for Visit: Steroid induced hyperglycemia: 197 mg/dL  Inpatient Diabetes Program Recommendations Correction (SSI): Add Novolog Correction  Courtney Heys PhD, RN, BC-ADM Diabetes Coordinator  Office:  [phone] Team Pager:  [phone]

## 2013-12-31 NOTE — Progress Notes (Signed)
Patient transported from ED by EMT via stretcher, with 4L O2 via [place] and telemetry monitor, daughter present. Shanon Brow, MD paged. Oriented to room and call button, bed rails up x2, bed alarm on.

## 2013-12-31 NOTE — Progress Notes (Signed)
Patient admitted this AM by Dr. Shanon Brow +strep PNA Wears 2L-2.5L at night and PRN during days PCP: [facility] Pulm: Dr. Jeanine Luz DO

## 2013-12-31 NOTE — Progress Notes (Signed)
INITIAL NUTRITION ASSESSMENT  DOCUMENTATION CODES Per approved criteria  -Severe malnutrition in the context of chronic illness -Underweight   INTERVENTION: Ensure Complete po BID, each supplement provides 350 kcal and 13 grams of protein RD to follow for nutrition care plan  NUTRITION DIAGNOSIS: Increased nutrient needs related to COPD as evidenced by estimated nutrition needs  Goal: Pt to meet >/= 90% of their estimated nutrition needs   Monitor:  PO & supplemental intake, weight, labs, I/O's  Reason for Assessment: Malnutrition Screening Tool Report  80 y.o. male  Admitting Dx: Community acquired pneumonia  ASSESSMENT: [name] with history of COPD who presented with COPD exacerbation vs pneumonia.  Patient reports his appetite is good, however, he knows he doesn't eat enough at home; sometimes he will drink Boost or Ensure supplements; states he has lost "a couple" pounds; per wt readings, pt has had a 4% weight loss since February 2015 -- not significant for time frame; RD order Ensure Complete during this hospitalization.  Dietary recall: Breakfast -- coffee with toast or biscuit Lunch -- snack (on the go) Dinner -- meat, starch, veggie  Nutrition Focused Physical Exam:  Subcutaneous Fat:  Orbital Region: mild to moderate depletion Upper Arm Region: WNL Thoracic and Lumbar Region: N/A  Muscle:  Temple Region: severe depletion Clavicle Bone Region: severe depletion Clavicle and Acromion Bone Region: severe depletion Scapular Bone Region: N/A Dorsal Hand: N/A Patellar Region: N/A Anterior Thigh Region: N/A Posterior Calf Region: N/A  Edema: none  Patient meets criteria for severe malnutrition in the context of chronic illness as evidenced by < 75% intake of estimated energy requirement for > 1 month, severe muscle loss and severe subcutaneous fat loss.  Height: Ht Readings from Last 1 Encounters:  12/30/13 5' 7.5" (1.715 m)    Weight: Wt  Readings from Last 1 Encounters:  12/31/13 106 lb 14.8 oz (48.5 kg)    Ideal Body Weight: 148 lb  % Ideal Body Weight: 72%  Wt Readings from Last 10 Encounters:  12/31/13 106 lb 14.8 oz (48.5 kg)  11/17/13 109 lb 3.2 oz (49.533 kg)  10/10/13 110 lb (49.896 kg)  09/24/13 101 lb 4.8 oz (45.949 kg)  09/01/13 109 lb 3.2 oz (49.533 kg)  07/30/13 113 lb (51.256 kg)  07/16/13 107 lb (48.535 kg)  06/27/13 110 lb (49.896 kg)  06/09/13 108 lb 3.2 oz (49.079 kg)  04/24/13 109 lb 9.6 oz (49.714 kg)    Usual Body Weight: 110 lb  % Usual Body Weight: 96%  BMI:  Body mass index is 16.49 kg/(m^2).  Estimated Nutritional Needs: Kcal: 1450-1650 Protein: 60-70 gm Fluid: >/= 1.5 L  Skin: Intact  Diet Order: Cardiac  EDUCATION NEEDS: -No education needs identified at this time   Intake/Output Summary (Last 24 hours) at 12/31/13 1533 Last data filed at 12/31/13 1325  Gross per 24 hour  Intake    480 ml  Output   1200 ml  Net   -720 ml    Labs:   Recent Labs Lab 12/30/13 2305 12/31/13 0345  NA 141 137  K 4.1 3.8  CL 101 98  CO2 27 23  BUN 12 12  CREATININE 0.78 0.76  CALCIUM 9.5 9.3  GLUCOSE 107* 197*    Scheduled Meds: . azithromycin  500 mg Intravenous Q24H  . cefTRIAXone (ROCEPHIN)  IV  1 g Intravenous Q24H  . enoxaparin (LOVENOX) injection  40 mg Subcutaneous Q24H  . methylPREDNISolone (SOLU-MEDROL) injection  80 mg Intravenous Q12H  Continuous Infusions:   Past Medical History  Diagnosis Date  . Chronic airway obstruction, not elsewhere classified     on home O2 at night  . BPH (benign prostatic hypertrophy)   . Tobacco abuse   . Weight loss   . Shingles rash 11/13/2011  . Shortness of breath     Past Surgical History  Procedure Laterality Date  . No past surgeries      Arthur Holms, RD, LDN Pager #: [phone] After-Hours Pager #: [phone]

## 2014-01-01 DIAGNOSIS — R0902 Hypoxemia: Secondary | ICD-10-CM

## 2014-01-01 DIAGNOSIS — E43 Unspecified severe protein-calorie malnutrition: Secondary | ICD-10-CM | POA: Insufficient documentation

## 2014-01-01 DIAGNOSIS — B953 Streptococcus pneumoniae as the cause of diseases classified elsewhere: Secondary | ICD-10-CM

## 2014-01-01 DIAGNOSIS — J9610 Chronic respiratory failure, unspecified whether with hypoxia or hypercapnia: Secondary | ICD-10-CM

## 2014-01-01 MED ORDER — PREDNISONE 10 MG PO TABS: ORAL_TABLET | ORAL | Status: DC

## 2014-01-01 MED ORDER — CEPHALEXIN 500 MG PO CAPS
500.0000 mg | ORAL_CAPSULE | Freq: Three times a day (TID) | ORAL | Status: DC
  Administered 2014-01-01: 500 mg via ORAL
  Filled 2014-01-01 (×3): qty 1

## 2014-01-01 MED ORDER — METHYLPREDNISOLONE SODIUM SUCC 125 MG IJ SOLR
60.0000 mg | Freq: Two times a day (BID) | INTRAMUSCULAR | Status: DC
  Filled 2014-01-01 (×2): qty 0.96

## 2014-01-01 MED ORDER — CEPHALEXIN 500 MG PO CAPS: 500.0000 mg | ORAL_CAPSULE | Freq: Three times a day (TID) | ORAL | Status: DC

## 2014-01-01 MED ORDER — PREDNISONE 20 MG PO TABS
40.0000 mg | ORAL_TABLET | Freq: Every day | ORAL | Status: DC
  Filled 2014-01-01: qty 2

## 2014-01-01 NOTE — Progress Notes (Signed)
IV and tele removed Rickard Rhymes, RN

## 2014-01-01 NOTE — Discharge Summary (Signed)
Physician Discharge Summary  Frank Garcia CWC:[id number] DOB: [birth year] DOA: 12/30/2013  PCP: No PCP Per Patient  Admit date: 12/30/2013 Discharge date: 01/01/2014  Time spent: 35 minutes  Recommendations for Outpatient Follow-up:  1. Continue home O2 but wear 24/7 for now  Discharge Diagnoses:  Principal Problem:   Community acquired pneumonia Active Problems:   COPD, very severe   Coronary atherosclerosis due to calcified coronary lesion(414.4)   Chronic respiratory failure with hypoxia   COPD exacerbation   Protein-calorie malnutrition, severe   Discharge Condition: improved  Diet recommendation: cardiac  Filed Weights   12/30/13 2226 12/31/13 0133 01/01/14 0504  Weight: 48.535 kg (107 lb) 48.5 kg (106 lb 14.8 oz) 45.6 kg (100 lb 8.5 oz)    History of present illness:  80 yo male h/o copd on 2.5 L [place] oxygen qhs and prn daily comes in with several days of worsening sob and wheezing along with a productive cough. No fevers. Pt says last hospitalization was in [place] for copd and he was on steroids then. He has not had to be on steroids since January. No swelling in his legs. He feels better since arrival to ED, with nebs and steroids. He has not been on abx lately either. Has infiltrate on pna.   Hospital Course:  Strep pna- keflex for 8 days total abx, wean steroids, home O2 continuous- follow up with Dr. Katherine Basset   Procedures:    Consultations:  none  Discharge Exam: Filed Vitals:   01/01/14 0504  BP: 127/56  Pulse: 71  Temp: 97.8 F (36.6 C)  Resp: 18    General: A+Ox3, NAD- has been up walking around house, anxious to go home Cardiovascular: regular Respiratory:decreased lung sounds  Discharge Instructions You were cared for by a hospitalist during your hospital stay. If you have any questions about your discharge medications or the care you received while you were in the hospital after you are discharged, you can call the unit and asked to speak with the  hospitalist on call if the hospitalist that took care of you is not available. Once you are discharged, your primary care physician will handle any further medical issues. Please note that NO REFILLS for any discharge medications will be authorized once you are discharged, as it is imperative that you return to your primary care physician (or establish a relationship with a primary care physician if you do not have one) for your aftercare needs so that they can reassess your need for medications and monitor your lab values.      Discharge Orders   Future Orders Complete By Expires   Diet - low sodium heart healthy  As directed    Discharge instructions  As directed    Increase activity slowly  As directed        Medication List         Aclidinium Bromide 400 MCG/ACT Aepb  Commonly known as:  TUDORZA PRESSAIR  Inhale 1 puff into the lungs 2 (two) times daily.     albuterol (2.5 MG/3ML) 0.083% nebulizer solution  Commonly known as:  PROVENTIL  Take 3 mLs (2.5 mg total) by nebulization every 2 (two) hours as needed for wheezing or shortness of breath.     albuterol 108 (90 BASE) MCG/ACT inhaler  Commonly known as:  PROVENTIL HFA;VENTOLIN HFA  Inhale 2 puffs into the lungs every 4 (four) hours as needed for wheezing or shortness of breath.     BREO ELLIPTA 100-25 MCG/INH Aepb  Generic drug:  Fluticasone Furoate-Vilanterol  Inhale 1 puff into the lungs daily.     cephALEXin 500 MG capsule  Commonly known as:  KEFLEX  Take 1 capsule (500 mg total) by mouth every 8 (eight) hours.     ENSURE HIGH PROTEIN PO  Take 1 Can by mouth daily.     predniSONE 10 MG tablet  Commonly known as:  DELTASONE  40 mg x 2 days, 30 mg x 2 days, 20 mg x 2 days, 10 mg x 2 days, 5mg  x 2 days then d/c       No Known Allergies    The results of significant diagnostics from this hospitalization (including imaging, microbiology, ancillary and laboratory) are listed below for reference.    Significant  Diagnostic Studies: Dg Chest Port 1 View  12/30/2013   CLINICAL DATA:  Cough, shortness of breath  EXAM: PORTABLE CHEST - 1 VIEW  COMPARISON:  DG CHEST 2 VIEW dated 11/25/2013  FINDINGS: The lungs are hyperinflated likely secondary to COPD. There is left lower lobe airspace disease. The lungs are otherwise clear. No pleural effusion or pneumothorax. The heart and mediastinal contours are unremarkable.  The osseous structures are unremarkable.  IMPRESSION: Left lower lobe airspace disease which may reflect atelectasis versus pneumonia.   Electronically Signed   By: Kathreen Devoid   On: 12/30/2013 23:00    Microbiology: Recent Results (from the past 240 hour(s))  CULTURE, BLOOD (ROUTINE X 2)     Status: None   Collection Time    12/31/13  3:40 AM      Result Value Ref Range Status   Specimen Description BLOOD LEFT ARM   Final   Special Requests BOTTLES DRAWN AEROBIC AND ANAEROBIC 10CC EACH   Final   Culture  Setup Time     Final   Value: 12/31/2013 08:53     Performed at [facility]   Culture     Final   Value:        BLOOD CULTURE RECEIVED NO GROWTH TO DATE CULTURE WILL BE HELD FOR 5 DAYS BEFORE ISSUING A FINAL NEGATIVE REPORT     Performed at [facility]   Report Status PENDING   Incomplete  CULTURE, BLOOD (ROUTINE X 2)     Status: None   Collection Time    12/31/13  3:45 AM      Result Value Ref Range Status   Specimen Description BLOOD LEFT HAND   Final   Special Requests BOTTLES DRAWN AEROBIC ONLY 10CC   Final   Culture  Setup Time     Final   Value: 12/31/2013 08:53     Performed at [facility]   Culture     Final   Value:        BLOOD CULTURE RECEIVED NO GROWTH TO DATE CULTURE WILL BE HELD FOR 5 DAYS BEFORE ISSUING A FINAL NEGATIVE REPORT     Performed at [facility]   Report Status PENDING   Incomplete     Labs: Basic Metabolic Panel:  Recent Labs Lab 12/30/13 2305 12/31/13 0345  NA 141 137  K 4.1 3.8  CL 101 98  CO2 27 23  GLUCOSE  107* 197*  BUN 12 12  CREATININE 0.78 0.76  CALCIUM 9.5 9.3   Liver Function Tests: No results found for this basename: AST, ALT, ALKPHOS, BILITOT, PROT, ALBUMIN,  in the last 168 hours No results found for this basename: LIPASE, AMYLASE,  in the  last 168 hours No results found for this basename: AMMONIA,  in the last 168 hours CBC:  Recent Labs Lab 12/30/13 2305 12/31/13 0345  WBC 18.5* 22.5*  NEUTROABS 17.0* 21.8*  HGB 13.0 13.0  HCT 39.1 38.4*  MCV 83.4 82.6  PLT 228 234   Cardiac Enzymes: No results found for this basename: CKTOTAL, CKMB, CKMBINDEX, TROPONINI,  in the last 168 hours BNP: BNP (last 3 results)  Recent Labs  09/24/13 0824 12/30/13 2305  PROBNP 87.6 59.7   CBG: No results found for this basename: GLUCAP,  in the last 168 hours     Signed:  [facility] 01/01/2014, 9:57 AM

## 2014-01-01 NOTE — Progress Notes (Signed)
Discharge education, medication, and prescriptions reviewed with pt, pt stated understanding and that he had no questions, pt awaiting portable oxygen tank for transport and daughter for pickup Rickard Rhymes, RN

## 2014-01-05 ENCOUNTER — Telehealth: Payer: Self-pay | Admitting: Internal Medicine

## 2014-01-05 NOTE — Telephone Encounter (Signed)
Called and spoke with the pt  He states that he was recently d/c'ed from the hospital on o2  [facility] advised that he needs "to be evaluated" he states not sure what they are needing but he is confused  LMTCB for [facility]

## 2014-01-06 LAB — CULTURE, BLOOD (ROUTINE X 2)
CULTURE: NO GROWTH
Culture: NO GROWTH

## 2014-01-06 NOTE — Telephone Encounter (Signed)
Melissa called back. She is not showing they need anything on pt.  Pt has home fill system and ML6 tanks. Pt did not want eclipse back in march. Called pt and made him aware. He needs his tanks refilled and will call [facility] to come out and do so. Nothing further needed

## 2014-01-14 ENCOUNTER — Telehealth: Payer: Self-pay | Admitting: Internal Medicine

## 2014-01-14 NOTE — Telephone Encounter (Signed)
Please give him several samples for now. I will discuss alternatives at Office visit  Thanks  Dr. Brand Males, M.D., [facility].C.P Pulmonary and Critical Care Medicine Staff Physician [place] [facility] Pager: [phone], If no answer or between  15:00h - 7:00h: call [phone]  01/14/2014 5:32 PM

## 2014-01-14 NOTE — Telephone Encounter (Signed)
Pt calling to discuss meds Pt has Medicare/[employer]--Pt states that [facility] is $143/mo out of pocket. Unable to use discount card bc he is Medicare. Pt scheduled for appt with MR 6/2 at 2:15 for routine 2 month f/u  Pt requesting alternative for Breo.   Please advise Dr Chase Caller. Thanks.

## 2014-01-15 MED ORDER — FLUTICASONE FUROATE-VILANTEROL 100-25 MCG/INH IN AEPB: 1.0000 | INHALATION_SPRAY | Freq: Every day | RESPIRATORY_TRACT | Status: DC

## 2014-01-15 NOTE — Telephone Encounter (Signed)
lmomtcb x1 for pt. Samples will be left for p/u

## 2014-01-27 ENCOUNTER — Telehealth: Payer: Self-pay | Admitting: Internal Medicine

## 2014-01-27 ENCOUNTER — Encounter: Payer: Self-pay | Admitting: Internal Medicine

## 2014-01-27 ENCOUNTER — Ambulatory Visit (INDEPENDENT_AMBULATORY_CARE_PROVIDER_SITE_OTHER): Payer: Medicare Other | Admitting: Internal Medicine

## 2014-01-27 ENCOUNTER — Ambulatory Visit (INDEPENDENT_AMBULATORY_CARE_PROVIDER_SITE_OTHER)
Admission: RE | Admit: 2014-01-27 | Discharge: 2014-01-27 | Disposition: A | Discharge status: Home / Self Care | Payer: Medicare Other | Source: Ambulatory Visit | Attending: Internal Medicine | Admitting: Internal Medicine

## 2014-01-27 VITALS — BP 158/78 | HR 86 | Ht 67.5 in | Wt 103.0 lb

## 2014-01-27 DIAGNOSIS — J9611 Chronic respiratory failure with hypoxia: Secondary | ICD-10-CM

## 2014-01-27 DIAGNOSIS — J189 Pneumonia, unspecified organism: Secondary | ICD-10-CM

## 2014-01-27 DIAGNOSIS — R638 Other symptoms and signs concerning food and fluid intake: Secondary | ICD-10-CM

## 2014-01-27 DIAGNOSIS — J441 Chronic obstructive pulmonary disease with (acute) exacerbation: Secondary | ICD-10-CM

## 2014-01-27 DIAGNOSIS — J9610 Chronic respiratory failure, unspecified whether with hypoxia or hypercapnia: Secondary | ICD-10-CM

## 2014-01-27 DIAGNOSIS — J449 Chronic obstructive pulmonary disease, unspecified: Secondary | ICD-10-CM

## 2014-01-27 DIAGNOSIS — R0902 Hypoxemia: Secondary | ICD-10-CM

## 2014-01-27 MED ORDER — DOXYCYCLINE HYCLATE 100 MG PO TABS: ORAL_TABLET | ORAL | Status: DC

## 2014-01-27 MED ORDER — ALBUTEROL SULFATE HFA 108 (90 BASE) MCG/ACT IN AERS: 2.0000 | INHALATION_SPRAY | RESPIRATORY_TRACT | Status: AC | PRN

## 2014-01-27 MED ORDER — ALBUTEROL SULFATE (2.5 MG/3ML) 0.083% IN NEBU: 2.5000 mg | INHALATION_SOLUTION | RESPIRATORY_TRACT | Status: DC | PRN

## 2014-01-27 MED ORDER — PREDNISONE 10 MG PO TABS: ORAL_TABLET | ORAL | Status: DC

## 2014-01-27 NOTE — Progress Notes (Signed)
Subjective:    Patient ID: Frank Garcia, male    DOB: 09-10-1928, 80 y.o.   MRN: [id number]  HPI   #FU Gold stage 3 copd - MM genotype  - feb 2009, fev1 1.13L/46% while on spiriva and advair with ex-desat, - June 2012:  Spirometry today (reviewed) - fev1 0.95L/38% and is a 110cc loss in fev1 in 3 years which approx 35cc/year - CAT score 16 Jan 2012 - Dec 2014: spiriva changed to tudorza due to urinary retention - Jan 2015: advair changed to [facility]  # pulmonary nodules, - RUL apical scar like density  - 2010 and 2011 -absent  - 2012 and 2013 - new and stable -03/24/13  - progression to 1.6cm    -  Onncimmne test reesult 04/29/13 is LOW level for 7 ung cancer proteins.  - Xpresys indi test done 04/29/13 is LIKELY BENGIN for nodule at 86% probability - 09/01/13 - stabl bilateral UL scarring. REC: annual low dose CT  # ex-smoker,   #Cachexia it was for the last 1 month she's having increased cough with increased white mucus production but no increase in chest tightness or shortness of breath. Overall COPD cat score has gotten worse  - Body mass index is 18.17 kg/(m^2). on 11/13/2011 (weight 116# and up 5# since June 2012)  - - Body mass index is 16.94 kg/(m^2). on 06/09/2013 - Body mass index is 15.88 kg/(m^2). on 01/27/2014    #Shingles - MArch 2013     #Recurrent COPD exacerbation - March 2013: Office treatment for COPD exacerbation - May 2013: Office treatment with doxycycline and prednisone for COPD exacerbation  - August 2030: Started roflumilast May 2-14 - office RX Possible COPDAE./ Rx doxy and pred Dec 2014 - office Rx Prdnisone and Augmentin. STopped roflumilast Mary 2015 - admit LLL PNA - Pneumoccoccal CAP  #hoarse voice - ENT evaluation and 2013 normal   OV 01/27/2014  Chief Complaint  Patient presents with  . Follow-up    Pt hospitalized in May for pna. Pt c/o cough with white and clear mucous, increase in SOB with little activity. Pt has not had much mobility since  5/27. C/o intermittent chest tightness.    Followup for the above. since I last saw him he was admitted 12/30/2013 with a left lower lobe pneumococcal pneumonia that was confirmed by urine strep antigen. Since discharge he is feeling better. He presents with his daughter today for followup.the admission is left him with continued weight loss, cachexia and poor appetite despite drinking protein. However, for the last 3 days he has increased cough, dyspnea, increasing sputum volume and slight increase in color to thick white. He feels COPD exacerbation again. He and his daughter have questions on prognosis and overall expectation. He does not have home physical therapy. He is very interested in copd research trials HE continues his copd tudorza, breo and alb nebs and 3L o2 without fail  Review of Systems  Constitutional: Negative for fever and unexpected weight change.  HENT: Positive for congestion, rhinorrhea and sinus pressure. Negative for dental problem, ear pain, nosebleeds, postnasal drip, sneezing, sore throat and trouble swallowing.   Eyes: Negative for redness and itching.  Respiratory: Positive for cough, chest tightness and shortness of breath. Negative for wheezing.   Cardiovascular: Negative for palpitations and leg swelling.  Gastrointestinal: Negative for nausea and vomiting.  Genitourinary: Negative for dysuria.  Musculoskeletal: Negative for joint swelling.  Skin: Negative for rash.  Neurological: Negative for headaches.  Hematological:  Does not bruise/bleed easily.  Psychiatric/Behavioral: Negative for dysphoric mood. The patient is not nervous/anxious.        Objective:   Physical Exam   Filed Vitals:   01/27/14 1442  BP: 158/78  Pulse: 86  Height: 5' 7.5" (1.715 m)  Weight: 103 lb (46.72 kg)  SpO2: 97%    Physical Exam GEN: A/Ox3; pleasant , NAD, elderly, frail, thin. SITTING IN WHEEL CHAIR. LOOKS DECONDITIONED  HEENT:  [place]/AT,  EACs-clear, TMs-wnl, NOSE-clear,  THROAT-clear, no lesions, no postnasal drip or exudate noted.   NECK:  Supple w/ fair ROM; no JVD; normal carotid impulses w/o bruits; no thyromegaly or nodules palpated; no lymphadenopathy.  RESP  Diminished BS in bases no accessory muscle use, no dullness to percussion  CARD:  RRR, no m/r/g  , no peripheral edema, pulses intact, no cyanosis or clubbing.  GI:   Soft & nt; nml bowel sounds; no organomegaly or masses detected.  Musco: Warm bil, no deformities or joint swelling noted.   Neuro: alert, no focal deficits noted.    Skin: Warm, no lesions or rashes        Assessment & Plan:  I think you are in copd flare up again HAve CXR 2 view to ensure pneumonia from MAy 2015 clearing up Take doxycycline 100mg  po twice daily x 5 days; take after meals and avoid sunlight Take prednisone 40 mg daily x 2 days, then 20mg  daily x 2 days, then 10mg  daily x 2 days, then 5mg  daily x 2 days and stop Continue o2 and other copd nebs as before\ We will arrange home PT for you to help with strength  #WEight loss  - likely due to copd but talk to pcp    Followup  4 weeks or sooner if needed

## 2014-01-27 NOTE — Patient Instructions (Addendum)
I think you are in copd flare up again HAve CXR 2 view to ensure pneumonia from MAy 2015 clearing up Take doxycycline 100mg  po twice daily x 5 days; take after meals and avoid sunlight Take prednisone 40 mg daily x 2 days, then 20mg  daily x 2 days, then 10mg  daily x 2 days, then 5mg  daily x 2 days and stop Continue o2 and other copd nebs as before\ We will arrange home PT for you to help with strength  #WEight loss  - likely due to copd but talk to pcp No PCP Per Patient    Followup  4 weeks or sooner if needed

## 2014-01-27 NOTE — Telephone Encounter (Signed)
Daneil Dan  LEt Diamantina Monks know that cxr 01/27/2014 shows pneumonia has cleared  Thanks  Dr. Brand Males, M.D., [facility].C.P Pulmonary and Critical Care Medicine Staff Physician [place] [facility] Pager: [phone], If no answer or between  15:00h - 7:00h: call [phone]  01/27/2014 9:48 PM    No results found. Dg Chest 2 View  01/27/2014   CLINICAL DATA:  Followup pneumonia, cough, shortness of breath, former smoking history  EXAM: CHEST  2 VIEW  COMPARISON:  Chest x-ray of 12/30/2013  FINDINGS: The lungs remain markedly hyperaerated consistent with emphysema. The left basilar opacity noted previously has cleared. No active infiltrate or effusion is seen. Mediastinal contours are unchanged in the heart is within normal limits in size. No bony abnormality is noted.  IMPRESSION: Emphysema.  No active lung disease.   Electronically Signed   By: Ivar Drape M.D.   On: 01/27/2014 15:43

## 2014-01-28 NOTE — Telephone Encounter (Signed)
Called and spoke to pt regarding results per MR. Pt verbalized understanding and denied any further questions or concerns at this time.

## 2014-02-06 NOTE — Assessment & Plan Note (Signed)
I think you are in copd flare up again HAve CXR 2 view to ensure pneumonia from MAy 2015 clearing up Take doxycycline 100mg  po twice daily x 5 days; take after meals and avoid sunlight Take prednisone 40 mg daily x 2 days, then 20mg  daily x 2 days, then 10mg  daily x 2 days, then 5mg  daily x 2 days and stop Continue o2 and other copd nebs as before\ We will arrange home PT for you to help with strength  #WEight loss  - likely due to copd but talk to pcp    Followup  4 weeks or sooner if needed

## 2014-02-23 ENCOUNTER — Encounter: Payer: Self-pay | Admitting: Internal Medicine

## 2014-02-23 ENCOUNTER — Ambulatory Visit (INDEPENDENT_AMBULATORY_CARE_PROVIDER_SITE_OTHER): Payer: Medicare Other | Admitting: Internal Medicine

## 2014-02-23 VITALS — BP 140/62 | HR 77 | Ht 67.0 in | Wt 100.0 lb

## 2014-02-23 DIAGNOSIS — J449 Chronic obstructive pulmonary disease, unspecified: Secondary | ICD-10-CM

## 2014-02-23 DIAGNOSIS — R490 Dysphonia: Secondary | ICD-10-CM

## 2014-02-23 DIAGNOSIS — J9611 Chronic respiratory failure with hypoxia: Secondary | ICD-10-CM

## 2014-02-23 DIAGNOSIS — J9610 Chronic respiratory failure, unspecified whether with hypoxia or hypercapnia: Secondary | ICD-10-CM

## 2014-02-23 DIAGNOSIS — R0902 Hypoxemia: Secondary | ICD-10-CM

## 2014-02-23 MED ORDER — MOMETASONE FURO-FORMOTEROL FUM 200-5 MCG/ACT IN AERO: 2.0000 | INHALATION_SPRAY | Freq: Two times a day (BID) | RESPIRATORY_TRACT | Status: DC

## 2014-02-23 NOTE — Patient Instructions (Addendum)
#  COPD  - I think this is stable and you have recovered from recent flare up  - COntinue o2, and tudorza as before  -Due to cost, change BREO to [facility] 200/5 2 puff twice daily    #Cough and sinus drainage  - I do not think your copd is in flare up  -I think you have chronic rhinosinusitis -  take generic fluticasone inhaler 2 squirts each nostril daily - do netti pot at night   #FOllowup 6 weeks with me or my NP Tammy

## 2014-02-23 NOTE — Progress Notes (Signed)
Subjective:    Patient ID: Frank Garcia, male    DOB: 18-Oct-1928, 80 y.o.   MRN: [id number]  HPI    #FU Gold stage 3 copd - MM genotype  - feb 2009, fev1 1.13L/46% while on spiriva and advair with ex-desat, - June 2012:  Spirometry today (reviewed) - fev1 0.95L/38% and is a 110cc loss in fev1 in 3 years which approx 35cc/year - CAT score 16 Jan 2012 - Dec 2014: spiriva changed to tudorza due to urinary retention - Jan 2015: advair changed to [facility]  # pulmonary nodules, - RUL apical scar like density  - 2010 and 2011 -absent  - 2012 and 2013 - new and stable -03/24/13  - progression to 1.6cm    -  Onncimmne test reesult 04/29/13 is LOW level for 7 ung cancer proteins.  - Xpresys indi test done 04/29/13 is LIKELY BENGIN for nodule at 86% probability - 09/01/13 - stabl bilateral UL scarring. REC: annual low dose CT  # ex-smoker,   #Cachexia it was for the last 1 month she's having increased cough with increased white mucus production but no increase in chest tightness or shortness of breath. Overall COPD cat score has gotten worse  - Body mass index is 18.17 kg/(m^2). on 11/13/2011 (weight 116# and up 5# since June 2012)  - - Body mass index is 16.94 kg/(m^2). on 06/09/2013 - Body mass index is 15.88 kg/(m^2). on 01/27/2014    #Shingles - MArch 2013     #Recurrent COPD exacerbation - March 2013: Office treatment for COPD exacerbation - May 2013: Office treatment with doxycycline and prednisone for COPD exacerbation  - August 2030: Started roflumilast May 2-14 - office RX Possible COPDAE./ Rx doxy and pred Dec 2014 - office Rx Prdnisone and Augmentin. STopped roflumilast Mary 2015 - admit LLL PNA - Pneumoccoccal CAP  #hoarse voice - ENT evaluation and 2013 normal   OV 01/27/2014  Chief Complaint  Patient presents with  . Follow-up    Pt hospitalized in May for pna. Pt c/o cough with white and clear mucous, increase in SOB with little activity. Pt has not had much mobility since  5/27. C/o intermittent chest tightness.    Followup for the above. since I last saw him he was admitted 12/30/2013 with a left lower lobe pneumococcal pneumonia that was confirmed by urine strep antigen. Since discharge he is feeling better. He presents with his daughter today for followup.the admission is left him with continued weight loss, cachexia and poor appetite despite drinking protein. However, for the last 3 days he has increased cough, dyspnea, increasing sputum volume and slight increase in color to thick white. He feels COPD exacerbation again. He and his daughter have questions on prognosis and overall expectation. He does not have home physical therapy. He is very interested in copd research trials HE continues his copd tudorza, breo and alb nebs and 3L o2 without fail  I think you are in copd flare up again HAve CXR 2 view to ensure pneumonia from MAy 2015 clearing up Take doxycycline 100mg  po twice daily x 5 days; take after meals and avoid sunlight Take prednisone 40 mg daily x 2 days, then 20mg  daily x 2 days, then 10mg  daily x 2 days, then 5mg  daily x 2 days and stop Continue o2 and other copd nebs as before\ We will arrange home PT for you to help with strength  #WEight loss  - likely due to copd but talk to pcp  No PCP Per Patient    Followup  4 weeks or sooner if needed   OV 02/23/2014  Chief Complaint  Patient presents with  . Follow-up    Pt states with activity outdoors he becomes SOB. C/o increase in cough with intermittent yellow/orange mucous. Denies CP.     He has recovered o from recent COPD exacerbation. Improve dyspnea and improved chest tightness and improve wheezing. Improve fatigue.He is complaining of coust of BREO. Taking tudorzan and o2.  Although, he is having chronic sinus drainage that has returned along with significant cough with associated secretions. He is not doing his nasal steroids and netti pot any part anymore.  Past hx: no other  issues  Review of Systems  Constitutional: Negative for fever and unexpected weight change.  HENT: Positive for rhinorrhea. Negative for congestion, dental problem, ear pain, nosebleeds, postnasal drip, sinus pressure, sneezing, sore throat and trouble swallowing.   Eyes: Negative for redness and itching.  Respiratory: Positive for cough and shortness of breath. Negative for chest tightness and wheezing.   Cardiovascular: Negative for palpitations and leg swelling.  Gastrointestinal: Negative for nausea and vomiting.  Genitourinary: Negative for dysuria.  Musculoskeletal: Negative for joint swelling.  Skin: Negative for rash.  Neurological: Positive for dizziness. Negative for headaches.  Hematological: Does not bruise/bleed easily.  Psychiatric/Behavioral: Negative for dysphoric mood. The patient is not nervous/anxious.        Objective:   Physical Exam  Filed Vitals:   02/23/14 1628  BP: 140/62  Pulse: 77  Height: 5\' 7"  (1.702 m)  Weight: 100 lb (45.36 kg)  SpO2: 99%   Physical Exam GEN: A/Ox3; pleasant , NAD, elderly, frail, thin. SITTING IN WHEEL CHAIR. LOOKS DECONDITIONED  HEENT:  [place]/AT,  EACs-clear, TMs-wnl, NOSE-clear, THROAT-clear, no lesions, no postnasal drip or exudate noted.   NECK:  Supple w/ fair ROM; no JVD; normal carotid impulses w/o bruits; no thyromegaly or nodules palpated; no lymphadenopathy.  RESP  Diminished BS in bases no accessory muscle use, no dullness to percussion  CARD:  RRR, no m/r/g  , no peripheral edema, pulses intact, no cyanosis or clubbing.  GI:   Soft & nt; nml bowel sounds; no organomegaly or masses detected.  Musco: Warm bil, no deformities or joint swelling noted.   Neuro: alert, no focal deficits noted.    Skin: Warm, no lesions or rashes        Assessment & Plan:  #COPD  - I think this is stable and you have recovered from recent flare up  - COntinue o2, and tudorza as before  -Due to cost, change BREO to [facility] 200/5  2 puff twice daily    #Cough and sinus drainage  - I do not think your copd is in flare up  -I think you have chronic rhinosinusitis -  take generic fluticasone inhaler 2 squirts each nostril daily - do netti pot at night   #FOllowup 6 weeks with me or my NP Tammy

## 2014-03-16 ENCOUNTER — Telehealth: Payer: Self-pay | Admitting: Internal Medicine

## 2014-03-16 NOTE — Telephone Encounter (Signed)
Pt aware take Dulera 200/5 2 puffs BID and rinse mouth well after use. Nothing more needed at this time.

## 2014-03-31 ENCOUNTER — Other Ambulatory Visit: Payer: Self-pay | Admitting: Internal Medicine

## 2014-04-06 ENCOUNTER — Telehealth: Payer: Self-pay | Admitting: Internal Medicine

## 2014-04-06 MED ORDER — ACLIDINIUM BROMIDE 400 MCG/ACT IN AEPB: INHALATION_SPRAY | RESPIRATORY_TRACT | Status: DC

## 2014-04-06 NOTE — Telephone Encounter (Signed)
I called pt. Aware RX called in. Nothing further needed

## 2014-04-10 ENCOUNTER — Encounter: Payer: Self-pay | Admitting: Adult Health

## 2014-04-10 ENCOUNTER — Ambulatory Visit (INDEPENDENT_AMBULATORY_CARE_PROVIDER_SITE_OTHER): Payer: Medicare Other | Admitting: Adult Health

## 2014-04-10 ENCOUNTER — Ambulatory Visit: Payer: Medicare Other | Admitting: Adult Health

## 2014-04-10 VITALS — BP 100/56 | HR 87 | Temp 97.7°F | Ht 67.0 in | Wt 103.0 lb

## 2014-04-10 DIAGNOSIS — J449 Chronic obstructive pulmonary disease, unspecified: Secondary | ICD-10-CM

## 2014-04-10 DIAGNOSIS — Z23 Encounter for immunization: Secondary | ICD-10-CM

## 2014-04-10 NOTE — Addendum Note (Signed)
Addended by: Len Blalock on: 04/10/2014 09:50 AM   Modules accepted: Orders

## 2014-04-10 NOTE — Assessment & Plan Note (Signed)
Compensated without flare   Plan  Prevnar 13 vaccine today.  Call the middle of September to check on flu shot.  May use Mucinex Twice daily  As needed  Cough/congestion/thick mucus.  Continue on Dulera 2 puffs Twice daily   Continue on Tudorza 1 puff Twice daily   follow up Dr. Chase Caller in 2 months and As needed

## 2014-04-10 NOTE — Patient Instructions (Signed)
Prevnar 13 vaccine today.  Call the middle of September to check on flu shot.  May use Mucinex Twice daily  As needed  Cough/congestion/thick mucus.  Continue on Dulera 2 puffs Twice daily   Continue on Tudorza 1 puff Twice daily   follow up Dr. Chase Caller in 2 months and As needed

## 2014-04-10 NOTE — Progress Notes (Signed)
Subjective:    Patient ID: Frank Garcia, male    DOB: 1929-05-25, 80 y.o.   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  - Xpresys indi test done 04/29/13 is LIKELY BENGIN for nodule at 86% probability - 09/01/13 - stabl bilateral UL scarring. REC: annual low dose CT  # ex-smoker,   #Cachexia it was for the last 1 month she's having increased cough with increased white mucus production but no increase in chest tightness or shortness of breath. Overall COPD cat score has gotten worse  - Body mass index is 18.17 kg/(m^2). on 11/13/2011 (weight 116# and up 5# since June 2012)  - - Body mass index is 16.94 kg/(m^2). on 06/09/2013 - Body mass index is 15.88 kg/(m^2). on 01/27/2014    #Shingles - [facility]Arch 2013     #Recurrent COPD exacerbation - March 2013: Office treatment for COPD exacerbation - May 2013: Office treatment with doxycycline and prednisone for COPD exacerbation  - August 2030: Started roflumilast May 2-14 - office RX Possible COPDAE./ Rx doxy and pred Dec 2014 - office Rx Prdnisone and Augmentin. STopped roflumilast Mary 2015 - admit LLL PNA - Pneumoccoccal CAP  #hoarse voice - ENT evaluation and 2013 normal   OV 01/27/2014  Chief Complaint  Patient presents with  . Follow-up    Pt hospitalized in May for pna. Pt c/o cough with white and clear mucous, increase in SOB with little activity. Pt has not had much mobility since  5/27. C/o intermittent chest tightness.    Followup for the above. since I last saw him he was admitted 12/30/2013 with a left lower lobe pneumococcal pneumonia that was confirmed by urine strep antigen. Since discharge he is feeling better. He presents with his daughter today for followup.the admission is left him with continued weight loss, cachexia and poor appetite despite drinking protein. However, for the last 3 days he has increased cough, dyspnea, increasing sputum volume and slight increase in color to thick white. He feels COPD exacerbation again. He and his daughter have questions on prognosis and overall expectation. He does not have home physical therapy. He is very interested in copd research trials HE continues his copd tudorza, breo and alb nebs and 3L o2 without fail  I think you are in copd flare up again HAve CXR 2 view to ensure pneumonia from MAy 2015 clearing up Take doxycycline 100mg  po twice daily x 5 days; take after meals and avoid sunlight Take prednisone 40 mg daily x 2 days, then 20mg  daily x 2 days, then 10mg  daily x 2 days, then 5mg  daily x 2 days and stop Continue o2 and other copd nebs as before\ We will arrange home PT for you to help with strength  #WEight loss  - likely due to copd but talk to pcp  No PCP Per Patient    Followup  4 weeks or sooner if needed   OV 02/23/2014  Chief Complaint  Patient presents with  . Follow-up    Pt states with activity outdoors he becomes SOB. C/o increase in cough with intermittent yellow/orange mucous. Denies CP.     He has recovered o from recent COPD exacerbation. Improve dyspnea and improved chest tightness and improve wheezing. Improve fatigue.He is complaining of coust of BREO. Taking tudorzan and o2.  Although, he is having chronic sinus drainage that has returned along with significant cough with associated secretions. He is not doing his nasal steroids and netti pot any part anymore. >>no changes  04/10/2014  Follow up COPD  Returns for 6 week COPD follow up.  Reports breathing is doing well overall, does note some occasional prod cough with light yellow mucus x2 weeks.  Does complain of thick mucus in am, once gets up then he feels much bette.r   Denies increased SOB, wheezing, tightness, f/c/s, n/v/d, hemoptysis.  no refills needed at this time. Remains on [place] .  CXR 01/2014 w/ chronic changes and no acute process.  Wt is better, up 3 lbs, eating some better  Discussed prevnar vaccine today.     Review of Systems  Constitutional: Negative for fever and unexpected weight change.  HENT:  Negative for congestion, dental problem, ear pain, nosebleeds, postnasal drip, sinus pressure, sneezing, sore throat and trouble swallowing.   Eyes: Negative for redness and itching.  Respiratory:  Negative for chest tightness and wheezing.   Cardiovascular: Negative for palpitations and leg swelling.  Gastrointestinal: Negative for nausea and vomiting.  Genitourinary: Negative for dysuria.  Musculoskeletal: Negative for joint swelling.  Skin: Negative for rash.  Neurological:  Negative for headaches.  Hematological: Does not bruise/bleed easily.  Psychiatric/Behavioral: Negative for dysphoric mood. The patient is not nervous/anxious.        Objective:   Physical Exam  Physical Exam GEN: A/Ox3; pleasant , NAD, elderly, frail, thin.   HEENT:  [place]/AT,  EACs-clear, TMs-wnl, NOSE-clear, THROAT-clear, no lesions, no postnasal drip or exudate noted.   NECK:  Supple w/ fair ROM; no JVD; normal carotid impulses w/o bruits; no thyromegaly or nodules palpated; no lymphadenopathy.  RESP  Diminished BS in bases no accessory muscle use, no dullness to percussion  CARD:  RRR, no m/r/g  , no peripheral edema, pulses intact, no cyanosis or clubbing.  GI:   Soft & nt; nml bowel sounds; no organomegaly or masses detected.  Musco: Warm bil, no deformities or joint swelling noted.   Neuro: alert, no  focal deficits noted.    Skin: Warm, no lesions or rashes        Assessment & Plan:

## 2014-05-07 ENCOUNTER — Encounter: Payer: Self-pay | Admitting: Internal Medicine

## 2014-05-07 ENCOUNTER — Ambulatory Visit (INDEPENDENT_AMBULATORY_CARE_PROVIDER_SITE_OTHER): Payer: Medicare Other | Admitting: Internal Medicine

## 2014-05-07 VITALS — BP 114/58 | HR 90 | Ht 66.5 in | Wt 101.0 lb

## 2014-05-07 DIAGNOSIS — J9611 Chronic respiratory failure with hypoxia: Secondary | ICD-10-CM

## 2014-05-07 DIAGNOSIS — J9610 Chronic respiratory failure, unspecified whether with hypoxia or hypercapnia: Secondary | ICD-10-CM

## 2014-05-07 DIAGNOSIS — J3489 Other specified disorders of nose and nasal sinuses: Secondary | ICD-10-CM

## 2014-05-07 DIAGNOSIS — R0902 Hypoxemia: Secondary | ICD-10-CM

## 2014-05-07 DIAGNOSIS — J449 Chronic obstructive pulmonary disease, unspecified: Secondary | ICD-10-CM

## 2014-05-07 MED ORDER — AZITHROMYCIN 250 MG PO TABS: ORAL_TABLET | ORAL | Status: DC

## 2014-05-07 NOTE — Progress Notes (Signed)
Subjective:    Patient ID: Frank Garcia, male    DOB: Nov 16, 1928, 80 y.o.   MRN: [id number]  HPI   #FU Gold stage 3 copd - MM genotype  - feb 2009, fev1 1.13L/46% while on spiriva and advair with ex-desat, - June 2012:  Spirometry today (reviewed) - fev1 0.95L/38% and is a 110cc loss in fev1 in 3 years which approx 35cc/year - CAT score 16 Jan 2012 - Dec 2014: spiriva changed to tudorza due to urinary retention - Jan 2015: advair changed to [facility]  # pulmonary nodules, - RUL apical scar like density  - 2010 and 2011 -absent  - 2012 and 2013 - new and stable -03/24/13  - progression to 1.6cm    -  Onncimmne test reesult 04/29/13 is LOW level for 7 ung cancer proteins.  - Xpresys indi test done 04/29/13 is LIKELY BENGIN for nodule at 86% probability - 09/01/13 - stabl bilateral UL scarring. REC: annual low dose CT  # ex-smoker,   #Cachexia   - Body mass index is 18.17 kg/(m^2). on 11/13/2011 (weight 116# and up 5# since June 2012)  - - Body mass index is 16.94 kg/(m^2). on 06/09/2013 - Body mass index is 15.88 kg/(m^2). on 01/27/2014 - Body mass index is 16.06 kg/(m^2). on 05/07/2014    #Shingles - [facility]Arch 2013     #Recurrent COPD exacerbation - March 2013: Office treatment for COPD exacerbation - May 2013: Office treatment with doxycycline and prednisone for COPD exacerbation  - August 2030: Started roflumilast May 2-14 - office RX Possible COPDAE./ Rx doxy and pred Dec 2014 - office Rx Prdnisone and Augmentin. STopped roflumilast Mary 2015 - admit LLL PNA - Pneumoccoccal CAP  #hoarse voice - ENT evaluation and 2013 normal   OV 01/27/2014  Chief Complaint  Patient presents with  . Follow-up    Pt hospitalized in May for pna. Pt c/o cough with white and clear mucous, increase in SOB with little activity. Pt has not had much mobility since 5/27. C/o intermittent chest tightness.    Followup for the above. since I last saw him he was admitted 12/30/2013 with a left lower lobe  pneumococcal pneumonia that was confirmed by urine strep antigen. Since discharge he is feeling better. He presents with his daughter today for followup.the admission is left him with continued weight loss, cachexia and poor appetite despite drinking protein. However, for the last 3 days he has increased cough, dyspnea, increasing sputum volume and slight increase in color to thick white. He feels COPD exacerbation again. He and his daughter have questions on prognosis and overall expectation. He does not have home physical therapy. He is very interested in copd research trials HE continues his copd tudorza, breo and alb nebs and 3L o2 without fail  I think you are in copd flare up again HAve CXR 2 view to ensure pneumonia from MAy 2015 clearing up Take doxycycline 100mg  po twice daily x 5 days; take after meals and avoid sunlight Take prednisone 40 mg daily x 2 days, then 20mg  daily x 2 days, then 10mg  daily x 2 days, then 5mg  daily x 2 days and stop Continue o2 and other copd nebs as before\ We will arrange home PT for you to help with strength  #WEight loss  - likely due to copd but talk to pcp No PCP Per Patient    Followup  4 weeks or sooner if needed   OV 02/23/2014  Chief Complaint  Patient  presents with  . Follow-up    Pt states with activity outdoors he becomes SOB. C/o increase in cough with intermittent yellow/orange mucous. Denies CP.     He has recovered o from recent COPD exacerbation. Improve dyspnea and improved chest tightness and improve wheezing. Improve fatigue.He is complaining of coust of BREO. Taking tudorzan and o2.  Although, he is having chronic sinus drainage that has returned along with significant cough with associated secretions. He is not doing his nasal steroids and netti pot any part anymore. >>no changes  04/10/2014 Follow up COPD  Returns for 6 week COPD follow up.  Reports breathing is doing well overall, does note some occasional prod cough with light  yellow mucus x2 weeks.  Does complain of thick mucus in am, once gets up then he feels much bette.r   Denies increased SOB, wheezing, tightness, f/c/s, n/v/d, hemoptysis.  no refills needed at this time. Remains on [place] .  CXR 01/2014 w/ chronic changes and no acute process.  Wt is better, up 3 lbs, eating some better  Discussed prevnar vaccine today.    OV 05/07/2014  Chief Complaint  Patient presents with  . Follow-up    Pt states when taking mucinex he has urinary retention. Pt states he has increase in SOB, PND, prod cough with orange and clear mucous x 1week. Pt denies CP/tightness.    Followup severe COPD and chronic respiratory failure with multiple recurrent exacerbations. \ \ Since last visit with me in June 2015 and falling with nurse practitioner in August 2015 overall he has been stable. He has been compliant with his oxygen and his triple combination inhaler therapy. Follow for the past few days he's had increased cough compared to baseline associated with change in weakness of sputum with some change in color of sputum nor change in dyspnea wheezing. Denies any chest pain fever chills or edema. He is frustrated about recurrent  COPD exacerbation. He does not want prednisone preferably if it can be avoided   Review of Systems  Constitutional: Negative for fever and unexpected weight change.  HENT: Positive for postnasal drip. Negative for congestion, dental problem, ear pain, nosebleeds, rhinorrhea, sinus pressure, sneezing, sore throat and trouble swallowing.   Eyes: Negative for redness and itching.  Respiratory: Positive for cough and shortness of breath. Negative for chest tightness and wheezing.   Cardiovascular: Negative for palpitations and leg swelling.  Gastrointestinal: Negative for nausea and vomiting.  Genitourinary: Negative for dysuria.  Musculoskeletal: Negative for joint swelling.  Skin: Negative for rash.  Neurological: Negative for headaches.    Hematological: Does not bruise/bleed easily.  Psychiatric/Behavioral: Negative for dysphoric mood. The patient is not nervous/anxious.    Current outpatient prescriptions:Aclidinium Bromide (TUDORZA PRESSAIR) 400 MCG/ACT AEPB, INHALE 1 PUFF INTO THE LUNGS 2 (TWO) TIMES DAILY., Disp: 1 each, Rfl: 8;  albuterol (PROVENTIL HFA;VENTOLIN HFA) 108 (90 BASE) MCG/ACT inhaler, Inhale 2 puffs into the lungs every 4 (four) hours as needed for wheezing or shortness of breath., Disp: 1 Inhaler, Rfl: 3 albuterol (PROVENTIL) (2.5 MG/3ML) 0.083% nebulizer solution, Take 3 mLs (2.5 mg total) by nebulization every 2 (two) hours as needed for wheezing or shortness of breath., Disp: 540 mL, Rfl: 4;  mometasone-formoterol (DULERA) 200-5 MCG/ACT AERO, Inhale 2 puffs into the lungs 2 (two) times daily., Disp: 1 Inhaler, Rfl: 6;  Nutritional Supplements (ENSURE HIGH PROTEIN PO), Take 1 Can by mouth daily. , Disp: , Rfl:  RAPAFLO 8 MG CAPS capsule, Take 1 capsule  by mouth daily., Disp: , Rfl:      Objective:   Physical Exam  Filed Vitals:   05/07/14 1549  BP: 114/58  Pulse: 90  Height: 5' 6.5" (1.689 m)  Weight: 101 lb (45.813 kg)  SpO2: 94%     Physical Exam GEN: A/Ox3; pleasant , NAD, elderly, frail, thin.   HEENT:  [place]/AT,  EACs-clear, TMs-wnl, NOSE-clear, THROAT-clear, no lesions, no postnasal drip or exudate noted.   NECK:  Supple w/ fair ROM; no JVD; normal carotid impulses w/o bruits; no thyromegaly or nodules palpated; no lymphadenopathy.  RESP  Diminished BS in bases no accessory muscle use, no dullness to percussion  CARD:  RRR, no m/r/g  , no peripheral edema, pulses intact, no cyanosis or clubbing.  GI:   Soft & nt; nml bowel sounds; no organomegaly or masses detected.  Musco: Warm bil, no deformities or joint swelling noted.   Neuro: alert, no focal deficits noted.    Skin: Warm, no lesions or rashes        Assessment & Plan:  #COPD  - I think  You might be having a sliight flare  up  - COntinue o2, and tudorza and dulera scheduled daily as before - Try ZPAK  - Will hold off prednisone - take mucinex as needed but avoid if urinary retention problems occur - Flu shot when possible - #Cough and sinus drainage  -I think you have chronic rhinosinusitis - continue generic fluticasone inhaler 2 squirts each nostril daily - do netti pot at night   #FOllowup 2 months or sooner if needed

## 2014-05-07 NOTE — Patient Instructions (Addendum)
#  COPD  - I think  You might be having a sliight flare up  - COntinue o2, and tudorza and dulera scheduled daily as before - Try ZPAK  - Will hold off prednisone - take mucinex as needed but avoid if urinary retention problems occur - Flu shot when possible - #Cough and sinus drainage  -I think you have chronic rhinosinusitis - continue generic fluticasone inhaler 2 squirts each nostril daily - do netti pot at night   #FOllowup 2 months or sooner if needed

## 2014-05-14 ENCOUNTER — Telehealth: Payer: Self-pay | Admitting: Internal Medicine

## 2014-05-14 MED ORDER — PREDNISONE 10 MG PO TABS: ORAL_TABLET | ORAL | Status: DC

## 2014-05-14 NOTE — Telephone Encounter (Signed)
Ok try Take prednisone 40 mg daily x 2 days, then 20mg  daily x 2 days, then 10mg  daily x 2 days, then 5mg  daily x 2 days and stop   Let him know if he is continues to have recurrent aecopd then I might consider daily low dose prednisone   Thanks  Dr. Brand Males, M.D., [facility].C.P Pulmonary and Critical Care Medicine Staff Physician [place] [facility] Pager: [phone], If no answer or between  15:00h - 7:00h: call [phone]  05/14/2014 3:22 PM

## 2014-05-14 NOTE — Telephone Encounter (Signed)
Spoke with the pt  He states that he finished zpack and his symptoms are not better  He is still coughing up minimal yellow sputum  He feels like the zpack did not help with his symptoms  He denies any increased SOB, wheezing or CP  Would like further recs, please advise, thanks!

## 2014-05-14 NOTE — Telephone Encounter (Signed)
Per the 9.10.15 ov w/ MR: Patient Instructions      #COPD  - I think  You might be having a sliight flare up  - COntinue o2, and tudorza and dulera scheduled daily as before - Try ZPAK  - Will hold off prednisone - take mucinex as needed but avoid if urinary retention problems occur - Flu shot when possible - #Cough and sinus drainage  -I think you have chronic rhinosinusitis - continue generic fluticasone inhaler 2 squirts each nostril daily - do netti pot at night   #FOllowup 2 months or sooner if needed   LMOM TCB x1.

## 2014-05-14 NOTE — Telephone Encounter (Signed)
I spoke with the pt and notified of recs and he verbalized understanding  Rx was sent to pharm  Nothing further needed

## 2014-06-24 ENCOUNTER — Encounter: Payer: Self-pay | Admitting: Adult Health

## 2014-06-24 ENCOUNTER — Ambulatory Visit (INDEPENDENT_AMBULATORY_CARE_PROVIDER_SITE_OTHER): Payer: Medicare Other | Admitting: Adult Health

## 2014-06-24 VITALS — BP 112/60 | HR 83 | Temp 96.8°F | Ht 67.0 in | Wt 101.8 lb

## 2014-06-24 DIAGNOSIS — J441 Chronic obstructive pulmonary disease with (acute) exacerbation: Secondary | ICD-10-CM

## 2014-06-24 MED ORDER — PREDNISONE 10 MG PO TABS: ORAL_TABLET | ORAL | Status: DC

## 2014-06-24 MED ORDER — DOXYCYCLINE HYCLATE 100 MG PO TABS: 100.0000 mg | ORAL_TABLET | Freq: Two times a day (BID) | ORAL | Status: DC

## 2014-06-24 NOTE — Assessment & Plan Note (Signed)
Flare -recurrent   Plan  Doxycycline 100mg  Twice daily  For 7 days  Prednisone taper over next week.  Fluids and rest  Mucinex DM Twice daily  As needed for cough  Get flu shot next week ,if better.  Please contact office for sooner follow up if symptoms do not improve or worsen or seek emergency care  Follow up Dr. Chase Caller in 2 months and As needed   Please contact office for sooner follow up if symptoms do not improve or worsen or seek emergency care

## 2014-06-24 NOTE — Patient Instructions (Signed)
Doxycycline 100mg  Twice daily  For 7 days  Prednisone taper over next week.  Fluids and rest  Mucinex DM Twice daily  As needed for cough  Get flu shot next week ,if better.  Please contact office for sooner follow up if symptoms do not improve or worsen or seek emergency care  Follow up Dr. Chase Caller in 2 months and As needed   Please contact office for sooner follow up if symptoms do not improve or worsen or seek emergency care

## 2014-06-24 NOTE — Progress Notes (Signed)
Subjective:    Patient ID: Frank Garcia, male    DOB: [birth year], 80 y.o.   MRN: [id number]  HPI   #FU Gold stage 3 copd - MM genotype  - feb 2009, fev1 1.13L/46% while on spiriva and advair with ex-desat, - June 2012:  Spirometry today (reviewed) - fev1 0.95L/38% and is a 110cc loss in fev1 in 3 years which approx 35cc/year - CAT score 16 Jan 2012 - Dec 2014: spiriva changed to tudorza due to urinary retention - Jan 2015: advair changed to [facility]  # pulmonary nodules, - RUL apical scar like density  - 2010 and 2011 -absent  - 2012 and 2013 - new and stable -03/24/13  - progression to 1.6cm    -  Onncimmne test reesult 04/29/13 is LOW level for 7 ung cancer proteins.  - Xpresys indi test done 04/29/13 is LIKELY BENGIN for nodule at 86% probability - 09/01/13 - stabl bilateral UL scarring. REC: annual low dose CT  # ex-smoker,   #Cachexia   - Body mass index is 18.17 kg/(m^2). on 11/13/2011 (weight 116# and up 5# since June 2012)  - - Body mass index is 16.94 kg/(m^2). on 06/09/2013 - Body mass index is 15.88 kg/(m^2). on 01/27/2014 - Body mass index is 16.06 kg/(m^2). on 05/07/2014    #Shingles - MArch 2013     #Recurrent COPD exacerbation - March 2013: Office treatment for COPD exacerbation - May 2013: Office treatment with doxycycline and prednisone for COPD exacerbation  - August 2030: Started roflumilast May 2-14 - office RX Possible COPDAE./ Rx doxy and pred Dec 2014 - office Rx Prdnisone and Augmentin. STopped roflumilast Mary 2015 - admit LLL PNA - Pneumoccoccal CAP  #hoarse voice - ENT evaluation and 2013 normal   OV 01/27/2014  Chief Complaint  Patient presents with  . Follow-up    Pt hospitalized in May for pna. Pt c/o cough with white and clear mucous, increase in SOB with little activity. Pt has not had much mobility since 5/27. C/o intermittent chest tightness.    Followup for the above. since I last saw him he was admitted 12/30/2013 with a left lower lobe  pneumococcal pneumonia that was confirmed by urine strep antigen. Since discharge he is feeling better. He presents with his daughter today for followup.the admission is left him with continued weight loss, cachexia and poor appetite despite drinking protein. However, for the last 3 days he has increased cough, dyspnea, increasing sputum volume and slight increase in color to thick white. He feels COPD exacerbation again. He and his daughter have questions on prognosis and overall expectation. He does not have home physical therapy. He is very interested in copd research trials HE continues his copd tudorza, breo and alb nebs and 3L o2 without fail  I think you are in copd flare up again HAve CXR 2 view to ensure pneumonia from MAy 2015 clearing up Take doxycycline 100mg  po twice daily x 5 days; take after meals and avoid sunlight Take prednisone 40 mg daily x 2 days, then 20mg  daily x 2 days, then 10mg  daily x 2 days, then 5mg  daily x 2 days and stop Continue o2 and other copd nebs as before\ We will arrange home PT for you to help with strength  #WEight loss  - likely due to copd but talk to pcp No PCP Per Patient    Followup  4 weeks or sooner if needed   OV 02/23/2014  Chief Complaint  Patient  presents with  . Follow-up    Pt states with activity outdoors he becomes SOB. C/o increase in cough with intermittent yellow/orange mucous. Denies CP.     He has recovered o from recent COPD exacerbation. Improve dyspnea and improved chest tightness and improve wheezing. Improve fatigue.He is complaining of coust of BREO. Taking tudorzan and o2.  Although, he is having chronic sinus drainage that has returned along with significant cough with associated secretions. He is not doing his nasal steroids and netti pot any part anymore. >>no changes  04/10/2014 Follow up COPD  Returns for 6 week COPD follow up.  Reports breathing is doing well overall, does note some occasional prod cough with light  yellow mucus x2 weeks.  Does complain of thick mucus in am, once gets up then he feels much bette.r   Denies increased SOB, wheezing, tightness, f/c/s, n/v/d, hemoptysis.  no refills needed at this time. Remains on [place] .  CXR 01/2014 w/ chronic changes and no acute process.  Wt is better, up 3 lbs, eating some better  Discussed prevnar vaccine today.    OV 05/07/2014  Chief Complaint  Patient presents with  . Follow-up    Pt states when taking mucinex he has urinary retention. Pt states he has increase in SOB, PND, prod cough with orange and clear mucous x 1week. Pt denies CP/tightness.    Followup severe COPD and chronic respiratory failure with multiple recurrent exacerbations. \ \ Since last visit with me in June 2015 and falling with nurse practitioner in August 2015 overall he has been stable. He has been compliant with his oxygen and his triple combination inhaler therapy. Follow for the past few days he's had increased cough compared to baseline associated with change in weakness of sputum with some change in color of sputum nor change in dyspnea wheezing. Denies any chest pain fever chills or edema. He is frustrated about recurrent  COPD exacerbation. He does not want prednisone preferably if it can be avoided >>zpack   06/24/2014 Acute OV  Complains of MR pt here for prod cough with light yellow mucus, increased SOB, some wheezing and tightness x2 days.  Taking mucinex without much help.  Denies f/c/s, n/v/d, hemoptysis., chest pain, orthopnea , edema.  Remains on [place].      Review of Systems  Constitutional:   No  weight loss, night sweats,  Fevers, chills,  +fatigue, or  lassitude.  HEENT:   No headaches,  Difficulty swallowing,  Tooth/dental problems, or  Sore throat,                No sneezing, itching, ear ache, nasal congestion, post nasal drip,   CV:  No chest pain,  Orthopnea, PND, swelling in lower extremities, anasarca, dizziness,  palpitations, syncope.   GI  No heartburn, indigestion, abdominal pain, nausea, vomiting, diarrhea, change in bowel habits, loss of appetite, bloody stools.   Resp:    No chest wall deformity  Skin: no rash or lesions.  GU: no dysuria, change in color of urine, no urgency or frequency.  No flank pain, no hematuria   MS:  No joint pain or swelling.  No decreased range of motion.  No back pain.  Psych:  No change in mood or affect. No depression or anxiety.  No memory loss.         Objective:   Physical Exam  Physical Exam GEN: A/Ox3; pleasant , NAD, elderly, frail, thin.   HEENT:  [place]/AT,  EACs-clear, TMs-wnl, NOSE-clear, THROAT-clear, no lesions, no postnasal drip or exudate noted.   NECK:  Supple w/ fair ROM; no JVD; normal carotid impulses w/o bruits; no thyromegaly or nodules palpated; no lymphadenopathy.  RESP  Diminished BS in bases few rhonchi , no accessory muscle use, no dullness to percussion  CARD:  RRR, no m/r/g  , no peripheral edema, pulses intact, no cyanosis or clubbing.  GI:   Soft & nt; nml bowel sounds; no organomegaly or masses detected.  Musco: Warm bil, no deformities or joint swelling noted.   Neuro: alert, no focal deficits noted.    Skin: Warm, no lesions or rashes        Assessment & Plan:

## 2014-07-06 ENCOUNTER — Ambulatory Visit: Payer: Medicare Other | Admitting: Internal Medicine

## 2014-07-07 ENCOUNTER — Telehealth: Payer: Self-pay | Admitting: Internal Medicine

## 2014-07-07 MED ORDER — AZITHROMYCIN 250 MG PO TABS: 250.0000 mg | ORAL_TABLET | ORAL | Status: DC

## 2014-07-07 MED ORDER — PREDNISONE 10 MG PO TABS: ORAL_TABLET | ORAL | Status: DC

## 2014-07-07 NOTE — Telephone Encounter (Signed)
Pt c/o cough and chest tightness with soreness from cough. Pt reports chest congestion, unable to get mucus up. Requests something be called in to pharmacy is unable to be seen today.  Is there anywhere that the patient can be fit into schedule today to be seen?  [facility] [address]. No Known Allergies  Please advise Dr Chase Caller. Thanks.

## 2014-07-07 NOTE — Telephone Encounter (Signed)
Spoke with the pt and notified of recs per MR  He verbalized understanding  Rx was [facility] to [facility]

## 2014-07-07 NOTE — Telephone Encounter (Signed)
Likely AECOPD  Do Z PAK  Please take Take prednisone 40mg  once daily x 3 days, then 30mg  once daily x 3 days, then 20mg  once daily x 3 days, then prednisone 10mg  once daily  x 3 days and stop or cntinue at baseline if he is on daily prednisone  Dr. Brand Males, M.D., [facility]C.P Pulmonary and Critical Care Medicine Staff Physician [facility] Pager: [phone], If no answer or between  15:00h - 7:00h: call [phone]  07/07/2014 5:21 PM

## 2014-07-27 ENCOUNTER — Ambulatory Visit: Payer: Medicare Other | Admitting: Adult Health

## 2014-07-28 ENCOUNTER — Ambulatory Visit (INDEPENDENT_AMBULATORY_CARE_PROVIDER_SITE_OTHER): Payer: Medicare Other | Admitting: Adult Health

## 2014-07-28 ENCOUNTER — Other Ambulatory Visit: Payer: Self-pay | Admitting: Emergency Medicine

## 2014-07-28 ENCOUNTER — Encounter: Payer: Self-pay | Admitting: Adult Health

## 2014-07-28 ENCOUNTER — Ambulatory Visit (INDEPENDENT_AMBULATORY_CARE_PROVIDER_SITE_OTHER)
Admission: RE | Admit: 2014-07-28 | Discharge: 2014-07-28 | Disposition: A | Discharge status: Home / Self Care | Payer: Medicare Other | Source: Ambulatory Visit | Attending: Adult Health | Admitting: Adult Health

## 2014-07-28 VITALS — BP 126/70 | HR 92 | Temp 96.8°F | Ht 67.5 in | Wt 102.2 lb

## 2014-07-28 DIAGNOSIS — J441 Chronic obstructive pulmonary disease with (acute) exacerbation: Secondary | ICD-10-CM

## 2014-07-28 MED ORDER — PREDNISONE 10 MG PO TABS: ORAL_TABLET | ORAL | Status: DC

## 2014-07-28 MED ORDER — MOMETASONE FURO-FORMOTEROL FUM 200-5 MCG/ACT IN AERO: 2.0000 | INHALATION_SPRAY | Freq: Two times a day (BID) | RESPIRATORY_TRACT | Status: DC

## 2014-07-28 NOTE — Progress Notes (Signed)
Subjective:    Patient ID: Frank Garcia, male    DOB: [birth year], 80 y.o.   MRN: [id number]  HPI   #FU Gold stage 3 copd - MM genotype  - feb 2009, fev1 1.13L/46% while on spiriva and advair with ex-desat, - June 2012:  Spirometry today (reviewed) - fev1 0.95L/38% and is a 110cc loss in fev1 in 3 years which approx 35cc/year - CAT score 16 Jan 2012 - Dec 2014: spiriva changed to tudorza due to urinary retention - Jan 2015: advair changed to [facility]  # pulmonary nodules, - RUL apical scar like density  - 2010 and 2011 -absent  - 2012 and 2013 - new and stable -03/24/13  - progression to 1.6cm    -  Onncimmne test reesult 04/29/13 is LOW level for 7 ung cancer proteins.  - Xpresys indi test done 04/29/13 is LIKELY BENGIN for nodule at 86% probability - 09/01/13 - stabl bilateral UL scarring. REC: annual low dose CT  # ex-smoker,   #Cachexia   - Body mass index is 18.17 kg/(m^2). on 11/13/2011 (weight 116# and up 5# since June 2012)  - - Body mass index is 16.94 kg/(m^2). on 06/09/2013 - Body mass index is 15.88 kg/(m^2). on 01/27/2014 - Body mass index is 16.06 kg/(m^2). on 05/07/2014    #Shingles - MArch 2013     #Recurrent COPD exacerbation - March 2013: Office treatment for COPD exacerbation - May 2013: Office treatment with doxycycline and prednisone for COPD exacerbation  - August 2030: Started roflumilast May 2-14 - office RX Possible COPDAE./ Rx doxy and pred Dec 2014 - office Rx Prdnisone and Augmentin. STopped roflumilast Mary 2015 - admit LLL PNA - Pneumoccoccal CAP  #hoarse voice - ENT evaluation and 2013 normal   OV 01/27/2014  Chief Complaint  Patient presents with  . Follow-up    Pt hospitalized in May for pna. Pt c/o cough with white and clear mucous, increase in SOB with little activity. Pt has not had much mobility since 5/27. C/o intermittent chest tightness.    Followup for the above. since I last saw him he was admitted 12/30/2013 with a left lower lobe  pneumococcal pneumonia that was confirmed by urine strep antigen. Since discharge he is feeling better. He presents with his daughter today for followup.the admission is left him with continued weight loss, cachexia and poor appetite despite drinking protein. However, for the last 3 days he has increased cough, dyspnea, increasing sputum volume and slight increase in color to thick white. He feels COPD exacerbation again. He and his daughter have questions on prognosis and overall expectation. He does not have home physical therapy. He is very interested in copd research trials HE continues his copd tudorza, breo and alb nebs and 3L o2 without fail  I think you are in copd flare up again HAve CXR 2 view to ensure pneumonia from MAy 2015 clearing up Take doxycycline 100mg  po twice daily x 5 days; take after meals and avoid sunlight Take prednisone 40 mg daily x 2 days, then 20mg  daily x 2 days, then 10mg  daily x 2 days, then 5mg  daily x 2 days and stop Continue o2 and other copd nebs as before\ We will arrange home PT for you to help with strength  #WEight loss  - likely due to copd but talk to pcp No PCP Per Patient    Followup  4 weeks or sooner if needed   OV 02/23/2014  Chief Complaint  Patient  presents with  . Follow-up    Pt states with activity outdoors he becomes SOB. C/o increase in cough with intermittent yellow/orange mucous. Denies CP.     He has recovered o from recent COPD exacerbation. Improve dyspnea and improved chest tightness and improve wheezing. Improve fatigue.He is complaining of coust of BREO. Taking tudorzan and o2.  Although, he is having chronic sinus drainage that has returned along with significant cough with associated secretions. He is not doing his nasal steroids and netti pot any part anymore. >>no changes  04/10/2014 Follow up COPD  Returns for 6 week COPD follow up.  Reports breathing is doing well overall, does note some occasional prod cough with light  yellow mucus x2 weeks.  Does complain of thick mucus in am, once gets up then he feels much bette.r   Denies increased SOB, wheezing, tightness, f/c/s, n/v/d, hemoptysis.  no refills needed at this time. Remains on [place] .  CXR 01/2014 w/ chronic changes and no acute process.  Wt is better, up 3 lbs, eating some better  Discussed prevnar vaccine today.    OV 05/07/2014  Chief Complaint  Patient presents with  . Follow-up    Pt states when taking mucinex he has urinary retention. Pt states he has increase in SOB, PND, prod cough with orange and clear mucous x 1week. Pt denies CP/tightness.    Followup severe COPD and chronic respiratory failure with multiple recurrent exacerbations. \ \ Since last visit with me in June 2015 and falling with nurse practitioner in August 2015 overall he has been stable. He has been compliant with his oxygen and his triple combination inhaler therapy. Follow for the past few days he's had increased cough compared to baseline associated with change in weakness of sputum with some change in color of sputum nor change in dyspnea wheezing. Denies any chest pain fever chills or edema. He is frustrated about recurrent  COPD exacerbation. He does not want prednisone preferably if it can be avoided >>zpack   06/24/14 Acute OV  Complains of MR pt here for prod cough with light yellow mucus, increased SOB, some wheezing and tightness x2 days.  Taking mucinex without much help.  Denies f/c/s, n/v/d, hemoptysis., chest pain, orthopnea , edema.  Remains on [place].  >doxycyline rx  07/28/2014 Acute OV  Pt complains of increased SOB, some prod cough with light tan mucus, PND, some sharp pain in the right lung w/ coughing x2 days. Denies f/c/s, n/v/d, hemoptysis, leg swelling.  Remains on [place].  On O2 3/m .  Had similar symptoms last visit 1 month ago was treated with doxycycline and a prednisone taper. Says that his symptoms totally  resolved and was improved up until 2 days ago.    Review of Systems  Constitutional:   No  weight loss, night sweats,  Fevers, chills,  +fatigue, or  lassitude.  HEENT:   No headaches,  Difficulty swallowing,  Tooth/dental problems, or  Sore throat,                No sneezing, itching, ear ache, nasal congestion, post nasal drip,   CV:  No chest pain,  Orthopnea, PND, swelling in lower extremities, anasarca, dizziness, palpitations, syncope.   GI  No heartburn, indigestion, abdominal pain, nausea, vomiting, diarrhea, change in bowel habits, loss of appetite, bloody stools.   Resp:    No chest wall deformity  Skin: no rash or lesions.  GU: no dysuria, change  in color of urine, no urgency or frequency.  No flank pain, no hematuria   MS:  No joint pain or swelling.  No decreased range of motion.  No back pain.  Psych:  No change in mood or affect. No depression or anxiety.  No memory loss.         Objective:   Physical Exam  Physical Exam GEN: A/Ox3; pleasant , NAD, elderly, frail, thin.   HEENT:  [place]/AT,  EACs-clear, TMs-wnl, NOSE-clear, THROAT-clear, no lesions, no postnasal drip or exudate noted.   NECK:  Supple w/ fair ROM; no JVD; normal carotid impulses w/o bruits; no thyromegaly or nodules palpated; no lymphadenopathy.  RESP  Diminished BS in bases few rhonchi , no accessory muscle use, no dullness to percussion  CARD:  RRR, no m/r/g  , no peripheral edema, pulses intact, no cyanosis or clubbing.  GI:   Soft & nt; nml bowel sounds; no organomegaly or masses detected.  Musco: Warm bil, no deformities or joint swelling noted.   Neuro: alert, no focal deficits noted.    Skin: Warm, no lesions or rashes        Assessment & Plan:

## 2014-07-28 NOTE — Addendum Note (Signed)
Addended by: Parke Poisson E on: 07/28/2014 05:08 PM   Modules accepted: Orders

## 2014-07-28 NOTE — Patient Instructions (Addendum)
Prednisone taper over the next week. Chest x-ray today. Continue on Dulera 2 puffs Twice daily   Continue on Tudorza 1 puff Twice daily  Follow up Dr. Chase Caller in 1 month as planned and As needed

## 2014-07-28 NOTE — Assessment & Plan Note (Signed)
Recurrent flare  Check cxr today   Plan  Prednisone taper over the next week. Chest x-ray today. Continue on Dulera 2 puffs Twice daily   Continue on Tudorza 1 puff Twice daily  Follow up Dr. Chase Caller in 1 month as planned and As needed

## 2014-07-29 ENCOUNTER — Telehealth: Payer: Self-pay | Admitting: Internal Medicine

## 2014-07-29 ENCOUNTER — Telehealth: Payer: Self-pay | Admitting: Adult Health

## 2014-07-29 MED ORDER — MOMETASONE FURO-FORMOTEROL FUM 200-5 MCG/ACT IN AERO: 2.0000 | INHALATION_SPRAY | Freq: Two times a day (BID) | RESPIRATORY_TRACT | Status: DC

## 2014-07-29 MED ORDER — LEVOFLOXACIN 750 MG PO TABS: 750.0000 mg | ORAL_TABLET | Freq: Every day | ORAL | Status: AC

## 2014-07-29 NOTE — Telephone Encounter (Signed)
CAP _RUL opacity  Begin Levquin 750mg  daily -rx to pharm done  Pt aware  Please contact office for sooner follow up if symptoms do not improve or worsen or seek emergency care  Ov in 2 week with CXR

## 2014-07-29 NOTE — Telephone Encounter (Signed)
Pt states he is having trouble get his Dulera covered by insurance.  Pt states that he is out of Dulera currently and needs samples to last until everything is sorted by insurance.  2 samples placed up front for pick up. Nothing further needed.

## 2014-07-29 NOTE — Progress Notes (Signed)
Quick Note:  appt scheduled for 12/16 with TP. Nothing further needed ______

## 2014-07-29 NOTE — Telephone Encounter (Signed)
Pt called back again about the same and is anxious about getting his meds. I advised I will send this message back as urgent. Brand Males

## 2014-08-05 ENCOUNTER — Other Ambulatory Visit: Payer: Self-pay | Admitting: *Deleted

## 2014-08-05 MED ORDER — MOMETASONE FURO-FORMOTEROL FUM 200-5 MCG/ACT IN AERO: 2.0000 | INHALATION_SPRAY | Freq: Two times a day (BID) | RESPIRATORY_TRACT | Status: DC

## 2014-08-12 ENCOUNTER — Ambulatory Visit (INDEPENDENT_AMBULATORY_CARE_PROVIDER_SITE_OTHER)
Admission: RE | Admit: 2014-08-12 | Discharge: 2014-08-12 | Disposition: A | Discharge status: Home / Self Care | Payer: Medicare Other | Source: Ambulatory Visit | Attending: Adult Health | Admitting: Adult Health

## 2014-08-12 ENCOUNTER — Encounter: Payer: Self-pay | Admitting: Adult Health

## 2014-08-12 ENCOUNTER — Ambulatory Visit (INDEPENDENT_AMBULATORY_CARE_PROVIDER_SITE_OTHER): Payer: Medicare Other | Admitting: Adult Health

## 2014-08-12 VITALS — BP 110/56 | HR 86 | Temp 97.6°F | Ht 67.0 in | Wt 102.8 lb

## 2014-08-12 DIAGNOSIS — Z23 Encounter for immunization: Secondary | ICD-10-CM

## 2014-08-12 DIAGNOSIS — J189 Pneumonia, unspecified organism: Secondary | ICD-10-CM

## 2014-08-12 DIAGNOSIS — J449 Chronic obstructive pulmonary disease, unspecified: Secondary | ICD-10-CM

## 2014-08-12 NOTE — Progress Notes (Signed)
Subjective:    Patient ID: Frank Garcia, male    DOB: 1929-08-10, 80 y.o.   MRN: [id number]  HPI   #FU Gold stage 3 copd - MM genotype  - feb 2009, fev1 1.13L/46% while on spiriva and advair with ex-desat, - June 2012:  Spirometry today (reviewed) - fev1 0.95L/38% and is a 110cc loss in fev1 in 3 years which approx 35cc/year - CAT score 16 Jan 2012 - Dec 2014: spiriva changed to tudorza due to urinary retention - Jan 2015: advair changed to [facility]  # pulmonary nodules, - RUL apical scar like density  - 2010 and 2011 -absent  - 2012 and 2013 - new and stable -03/24/13  - progression to 1.6cm    -  Onncimmne test reesult 04/29/13 is LOW level for 7 ung cancer proteins.  - Xpresys indi test done 04/29/13 is LIKELY BENGIN for nodule at 86% probability - 09/01/13 - stabl bilateral UL scarring. REC: annual low dose CT  # ex-smoker,   #Cachexia   - Body mass index is 18.17 kg/(m^2). on 11/13/2011 (weight 116# and up 5# since June 2012)  - - Body mass index is 16.94 kg/(m^2). on 06/09/2013 - Body mass index is 15.88 kg/(m^2). on 01/27/2014 - Body mass index is 16.06 kg/(m^2). on 05/07/2014    #Shingles - MArch 2013     #Recurrent COPD exacerbation - March 2013: Office treatment for COPD exacerbation - May 2013: Office treatment with doxycycline and prednisone for COPD exacerbation  - August 2030: Started roflumilast May 2-14 - office RX Possible COPDAE./ Rx doxy and pred Dec 2014 - office Rx Prdnisone and Augmentin. STopped roflumilast Mary 2015 - admit LLL PNA - Pneumoccoccal CAP  #hoarse voice - ENT evaluation and 2013 normal   OV 01/27/2014  Chief Complaint  Patient presents with  . Follow-up    Pt hospitalized in May for pna. Pt c/o cough with white and clear mucous, increase in SOB with little activity. Pt has not had much mobility since 5/27. C/o intermittent chest tightness.    Followup for the above. since I last saw him he was admitted 12/30/2013 with a left lower lobe  pneumococcal pneumonia that was confirmed by urine strep antigen. Since discharge he is feeling better. He presents with his daughter today for followup.the admission is left him with continued weight loss, cachexia and poor appetite despite drinking protein. However, for the last 3 days he has increased cough, dyspnea, increasing sputum volume and slight increase in color to thick white. He feels COPD exacerbation again. He and his daughter have questions on prognosis and overall expectation. He does not have home physical therapy. He is very interested in copd research trials HE continues his copd tudorza, breo and alb nebs and 3L o2 without fail  I think you are in copd flare up again HAve CXR 2 view to ensure pneumonia from MAy 2015 clearing up Take doxycycline 100mg  po twice daily x 5 days; take after meals and avoid sunlight Take prednisone 40 mg daily x 2 days, then 20mg  daily x 2 days, then 10mg  daily x 2 days, then 5mg  daily x 2 days and stop Continue o2 and other copd nebs as before\ We will arrange home PT for you to help with strength  #WEight loss  - likely due to copd but talk to pcp No PCP Per Patient    Followup  4 weeks or sooner if needed   OV 02/23/2014  Chief Complaint  Patient  presents with  . Follow-up    Pt states with activity outdoors he becomes SOB. C/o increase in cough with intermittent yellow/orange mucous. Denies CP.     He has recovered o from recent COPD exacerbation. Improve dyspnea and improved chest tightness and improve wheezing. Improve fatigue.He is complaining of coust of BREO. Taking tudorzan and o2.  Although, he is having chronic sinus drainage that has returned along with significant cough with associated secretions. He is not doing his nasal steroids and netti pot any part anymore. >>no changes  04/10/2014 Follow up COPD  Returns for 6 week COPD follow up.  Reports breathing is doing well overall, does note some occasional prod cough with light  yellow mucus x2 weeks.  Does complain of thick mucus in am, once gets up then he feels much bette.r   Denies increased SOB, wheezing, tightness, f/c/s, n/v/d, hemoptysis.  no refills needed at this time. Remains on [place] .  CXR 01/2014 w/ chronic changes and no acute process.  Wt is better, up 3 lbs, eating some better  Discussed prevnar vaccine today.    OV 05/07/2014  Chief Complaint  Patient presents with  . Follow-up    Pt states when taking mucinex he has urinary retention. Pt states he has increase in SOB, PND, prod cough with orange and clear mucous x 1week. Pt denies CP/tightness.    Followup severe COPD and chronic respiratory failure with multiple recurrent exacerbations. \ \ Since last visit with me in June 2015 and falling with nurse practitioner in August 2015 overall he has been stable. He has been compliant with his oxygen and his triple combination inhaler therapy. Follow for the past few days he's had increased cough compared to baseline associated with change in weakness of sputum with some change in color of sputum nor change in dyspnea wheezing. Denies any chest pain fever chills or edema. He is frustrated about recurrent  COPD exacerbation. He does not want prednisone preferably if it can be avoided >>zpack   06/24/14 Acute OV  Complains of MR pt here for prod cough with light yellow mucus, increased SOB, some wheezing and tightness x2 days.  Taking mucinex without much help.  Denies f/c/s, n/v/d, hemoptysis., chest pain, orthopnea , edema.  Remains on [place].  >doxycyline rx  07/28/14  Acute OV  Pt complains of increased SOB, some prod cough with light tan mucus, PND, some sharp pain in the right lung w/ coughing x2 days. Denies f/c/s, n/v/d, hemoptysis, leg swelling.  Remains on [place].  On O2 3/m .  Had similar symptoms last visit 1 month ago was treated with doxycycline and a prednisone taper. Says that his symptoms totally  resolved and was improved up until 2 days ago. >>Levaquin rx   08/12/2014 Follow up PNA  Pt seen last visit for sick visit found to have new right apical PNA .  Tx w/ with Levaquin 750x 7  Returns today feeling much better . Says that he tolerated abx well, no n/v/d.  Cough, dyspnea and wheezing have improved  He denies any chest pain, orthopnea, PND or leg swelling Appetite is better.   Review of Systems  Constitutional:   No  weight loss, night sweats,  Fevers, chills,  +fatigue, or  lassitude.  HEENT:   No headaches,  Difficulty swallowing,  Tooth/dental problems, or  Sore throat,                No sneezing, itching,  ear ache, nasal congestion, post nasal drip,   CV:  No chest pain,  Orthopnea, PND, swelling in lower extremities, anasarca, dizziness, palpitations, syncope.   GI  No heartburn, indigestion, abdominal pain, nausea, vomiting, diarrhea, change in bowel habits, loss of appetite, bloody stools.   Resp:    No chest wall deformity  Skin: no rash or lesions.  GU: no dysuria, change in color of urine, no urgency or frequency.  No flank pain, no hematuria   MS:  No joint pain or swelling.  No decreased range of motion.  No back pain.  Psych:  No change in mood or affect. No depression or anxiety.  No memory loss.         Objective:   Physical Exam  Physical Exam GEN: A/Ox3; pleasant , NAD, elderly, frail, thin.   HEENT:  [place]/AT,  EACs-clear, TMs-wnl, NOSE-clear, THROAT-clear, no lesions, no postnasal drip or exudate noted.   NECK:  Supple w/ fair ROM; no JVD; normal carotid impulses w/o bruits; no thyromegaly or nodules palpated; no lymphadenopathy.  RESP  Diminished BS in bases few rhonchi , no accessory muscle use, no dullness to percussion  CARD:  RRR, no m/r/g  , no peripheral edema, pulses intact, no cyanosis or clubbing.  GI:   Soft & nt; nml bowel sounds; no organomegaly or masses detected.  Musco: Warm bil, no deformities or joint swelling noted.     Neuro: alert, no focal deficits noted.    Skin: Warm, no lesions or rashes        Assessment & Plan:

## 2014-08-12 NOTE — Assessment & Plan Note (Signed)
Right sided CAP>clinically improved with abx  Check xray today   Plan Flu shot  Chest xray today  May use Mucinex Twice daily  As needed  Cough/congestion/thick mucus.  Continue on Dulera 2 puffs Twice daily   Continue on Tudorza 1 puff Twice daily   Follow up Dr. Chase Caller next month as planned.  Please contact office for sooner follow up if symptoms do not improve or worsen or seek emergency care

## 2014-08-12 NOTE — Patient Instructions (Signed)
Flu shot  Chest xray today  May use Mucinex Twice daily  As needed  Cough/congestion/thick mucus.  Continue on Dulera 2 puffs Twice daily   Continue on Tudorza 1 puff Twice daily   Follow up Dr. Chase Caller next month as planned.  Please contact office for sooner follow up if symptoms do not improve or worsen or seek emergency care

## 2014-08-12 NOTE — Assessment & Plan Note (Signed)
Recent flare now resolved   Plan  Flu shot  Chest xray today  May use Mucinex Twice daily  As needed  Cough/congestion/thick mucus.  Continue on Dulera 2 puffs Twice daily   Continue on Tudorza 1 puff Twice daily   Follow up Dr. Chase Caller next month as planned.  Please contact office for sooner follow up if symptoms do not improve or worsen or seek emergency care

## 2014-08-26 ENCOUNTER — Encounter (HOSPITAL_COMMUNITY): Payer: Self-pay

## 2014-08-26 ENCOUNTER — Emergency Department (HOSPITAL_COMMUNITY): Payer: Medicare Other

## 2014-08-26 ENCOUNTER — Inpatient Hospital Stay (HOSPITAL_COMMUNITY)
Admission: EM | Admit: 2014-08-26 | Discharge: 2014-08-27 | DRG: 193 | Disposition: A | Discharge status: Home / Self Care | Payer: Medicare Other | Attending: Internal Medicine | Admitting: Internal Medicine

## 2014-08-26 DIAGNOSIS — Z9981 Dependence on supplemental oxygen: Secondary | ICD-10-CM | POA: Diagnosis not present

## 2014-08-26 DIAGNOSIS — N400 Enlarged prostate without lower urinary tract symptoms: Secondary | ICD-10-CM | POA: Diagnosis present

## 2014-08-26 DIAGNOSIS — J441 Chronic obstructive pulmonary disease with (acute) exacerbation: Secondary | ICD-10-CM | POA: Diagnosis present

## 2014-08-26 DIAGNOSIS — D649 Anemia, unspecified: Secondary | ICD-10-CM | POA: Diagnosis present

## 2014-08-26 DIAGNOSIS — E43 Unspecified severe protein-calorie malnutrition: Secondary | ICD-10-CM | POA: Diagnosis present

## 2014-08-26 DIAGNOSIS — Z87891 Personal history of nicotine dependence: Secondary | ICD-10-CM

## 2014-08-26 DIAGNOSIS — Z681 Body mass index (BMI) 19 or less, adult: Secondary | ICD-10-CM

## 2014-08-26 DIAGNOSIS — J189 Pneumonia, unspecified organism: Secondary | ICD-10-CM | POA: Diagnosis not present

## 2014-08-26 DIAGNOSIS — R0602 Shortness of breath: Secondary | ICD-10-CM | POA: Diagnosis not present

## 2014-08-26 DIAGNOSIS — B029 Zoster without complications: Secondary | ICD-10-CM | POA: Diagnosis present

## 2014-08-26 DIAGNOSIS — J9621 Acute and chronic respiratory failure with hypoxia: Secondary | ICD-10-CM | POA: Diagnosis present

## 2014-08-26 HISTORY — DX: Unspecified asthma, uncomplicated: J45.909

## 2014-08-26 HISTORY — DX: Pneumonia, unspecified organism: J18.9

## 2014-08-26 LAB — COMPREHENSIVE METABOLIC PANEL
ALK PHOS: 64 U/L (ref 39–117)
ALT: 9 U/L (ref 0–53)
AST: 21 U/L (ref 0–37)
Albumin: 3.4 g/dL — ABNORMAL LOW (ref 3.5–5.2)
Anion gap: 9 (ref 5–15)
BUN: 11 mg/dL (ref 6–23)
CHLORIDE: 107 meq/L (ref 96–112)
CO2: 24 mmol/L (ref 19–32)
CREATININE: 0.9 mg/dL (ref 0.50–1.35)
Calcium: 8.7 mg/dL (ref 8.4–10.5)
GFR calc Af Amer: 89 mL/min — ABNORMAL LOW (ref >90)
GFR calc non Af Amer: 77 mL/min — ABNORMAL LOW (ref >90)
GLUCOSE: 143 mg/dL — AB (ref 70–99)
Potassium: 3.8 mmol/L (ref 3.5–5.1)
Sodium: 140 mmol/L (ref 135–145)
TOTAL PROTEIN: 6.6 g/dL (ref 6.0–8.3)
Total Bilirubin: 2.3 mg/dL — ABNORMAL HIGH (ref 0.3–1.2)

## 2014-08-26 LAB — CBC WITH DIFFERENTIAL/PLATELET
Basophils Absolute: 0 10*3/uL (ref 0.0–0.1)
Basophils Relative: 0 % (ref 0–1)
Eosinophils Absolute: 0 10*3/uL (ref 0.0–0.7)
Eosinophils Relative: 0 % (ref 0–5)
HCT: 35.9 % — ABNORMAL LOW (ref 39.0–52.0)
Hemoglobin: 11.7 g/dL — ABNORMAL LOW (ref 13.0–17.0)
LYMPHS ABS: 0.3 10*3/uL — AB (ref 0.7–4.0)
Lymphocytes Relative: 3 % — ABNORMAL LOW (ref 12–46)
MCH: 27.9 pg (ref 26.0–34.0)
MCHC: 32.6 g/dL (ref 30.0–36.0)
MCV: 85.7 fL (ref 78.0–100.0)
MONO ABS: 0.4 10*3/uL (ref 0.1–1.0)
Monocytes Relative: 5 % (ref 3–12)
NEUTROS PCT: 92 % — AB (ref 43–77)
Neutro Abs: 8.2 10*3/uL — ABNORMAL HIGH (ref 1.7–7.7)
Platelets: 216 10*3/uL (ref 150–400)
RBC: 4.19 MIL/uL — AB (ref 4.22–5.81)
RDW: 16.1 % — ABNORMAL HIGH (ref 11.5–15.5)
WBC: 9 10*3/uL (ref 4.0–10.5)

## 2014-08-26 LAB — BRAIN NATRIURETIC PEPTIDE: B Natriuretic Peptide: 46.7 pg/mL (ref 0.0–100.0)

## 2014-08-26 LAB — TROPONIN I
Troponin I: <0.03 ng/mL (ref <0.031)
Troponin I: <0.03 ng/mL (ref <0.031)

## 2014-08-26 MED ORDER — ENSURE COMPLETE PO LIQD
237.0000 mL | Freq: Two times a day (BID) | ORAL | Status: DC
  Administered 2014-08-27: 237 mL via ORAL

## 2014-08-26 MED ORDER — PREDNISONE 20 MG PO TABS
40.0000 mg | ORAL_TABLET | Freq: Every day | ORAL | Status: DC
  Administered 2014-08-26 – 2014-08-27 (×2): 40 mg via ORAL
  Filled 2014-08-26 (×3): qty 2

## 2014-08-26 MED ORDER — IPRATROPIUM-ALBUTEROL 0.5-2.5 (3) MG/3ML IN SOLN
3.0000 mL | RESPIRATORY_TRACT | Status: DC
  Administered 2014-08-26 – 2014-08-27 (×5): 3 mL via RESPIRATORY_TRACT
  Filled 2014-08-26 (×5): qty 3

## 2014-08-26 MED ORDER — DEXTROSE 5 % IV SOLN
1.0000 g | Freq: Once | INTRAVENOUS | Status: AC
  Administered 2014-08-26: 1 g via INTRAVENOUS
  Filled 2014-08-26: qty 10

## 2014-08-26 MED ORDER — ACETAMINOPHEN 325 MG PO TABS
650.0000 mg | ORAL_TABLET | Freq: Once | ORAL | Status: AC
  Administered 2014-08-26: 650 mg via ORAL
  Filled 2014-08-26: qty 2

## 2014-08-26 MED ORDER — BUDESONIDE 0.25 MG/2ML IN SUSP
0.2500 mg | Freq: Two times a day (BID) | RESPIRATORY_TRACT | Status: DC
  Administered 2014-08-26 – 2014-08-27 (×2): 0.25 mg via RESPIRATORY_TRACT
  Filled 2014-08-26 (×4): qty 2

## 2014-08-26 MED ORDER — LEVOFLOXACIN IN D5W 500 MG/100ML IV SOLN
500.0000 mg | INTRAVENOUS | Status: DC
  Administered 2014-08-27: 500 mg via INTRAVENOUS
  Filled 2014-08-26: qty 100

## 2014-08-26 MED ORDER — SODIUM CHLORIDE 0.9 % IJ SOLN
3.0000 mL | Freq: Two times a day (BID) | INTRAMUSCULAR | Status: DC
  Administered 2014-08-26: 3 mL via INTRAVENOUS

## 2014-08-26 MED ORDER — ENOXAPARIN SODIUM 30 MG/0.3ML ~~LOC~~ SOLN
30.0000 mg | SUBCUTANEOUS | Status: DC
  Administered 2014-08-26: 30 mg via SUBCUTANEOUS
  Filled 2014-08-26 (×2): qty 0.3

## 2014-08-26 MED ORDER — AZITHROMYCIN 500 MG IV SOLR
500.0000 mg | Freq: Once | INTRAVENOUS | Status: AC
  Administered 2014-08-26: 500 mg via INTRAVENOUS
  Filled 2014-08-26: qty 500

## 2014-08-26 NOTE — ED Notes (Signed)
Per [facility]: pt. From home with SOB x 1 day. Chronic 3L O2 at home. Pt. Tried home neb and inhaler with no relief. EMS gave 1 albuterol txt and 125 solumedrol. Albuterol improved wheezing. Pt. Complaint of L side CP. Denies radiation. CP increases with cough or deep inspiration. ST on the monitor. EMS gave 324 ASA and 1 Nitro. With no relief. Nitro dropped systolic from 626 to 948 so EMS did not administer more.

## 2014-08-26 NOTE — H&P (Signed)
History and Physical    Frank Garcia QIW:[id number] DOB: [birth year] DOA: 08/26/2014  Referring physician: Dr. Wilson Singer PCP: No PCP Per Patient  Specialists: none   Chief Complaint: shortness of breath   HPI: Frank Garcia is a 80 y.o. male has a past medical history significant for COPD gold Stage III on chronic home oxygen, followed regularly by [facility] from pulmonology, presents to the emergency room with the chief complaint of acute on chronic shortness of breath, worsening over the last 24 hours, also with pleuritic type chest pain on the left side, as well as cough with sputum production. He has not noticed worsening of his wheezing was still has some intermittent. He denies any abdominal pain, nausea vomiting or diarrhea. He denies any lightheadedness or dizziness. He endorses poor appetite over the last 24 hours as well, however he generally states that his appetite is fairly good. He denies any fever or chills. He denies any headaches. He denies any focal weakness, however endorses feeling generally weak. He denies any flulike symptoms in the last 1-2 days or sick contacts.  Review of Systems: 10 point review of system reviewed, pertinent positives and negatives are per history of present illness otherwise negative  Past Medical History  Diagnosis Date  . Chronic airway obstruction, not elsewhere classified     on home O2 at night  . BPH (benign prostatic hypertrophy)   . Tobacco abuse   . Weight loss   . Shingles rash 11/13/2011  . Shortness of breath    Past Surgical History  Procedure Laterality Date  . No past surgeries     Social History:  reports that he quit smoking about 6 years ago. His smoking use included Cigarettes. He has a 60 pack-year smoking history. He has never used smokeless tobacco. He reports that he does not drink alcohol or use illicit drugs.  No Known Allergies  Family History  Problem Relation Age of Onset  . COPD Father     Prior to  Admission medications   Medication Sig Start Date End Date Taking? Authorizing Provider  Aclidinium Bromide (TUDORZA PRESSAIR) 400 MCG/ACT AEPB INHALE 1 PUFF INTO THE LUNGS 2 (TWO) TIMES DAILY. Patient taking differently: Inhale 1 puff into the lungs 2 (two) times daily.  04/06/14  Yes Brand Males, MD  albuterol (PROVENTIL HFA;VENTOLIN HFA) 108 (90 BASE) MCG/ACT inhaler Inhale 2 puffs into the lungs every 4 (four) hours as needed for wheezing or shortness of breath. 01/27/14  Yes Brand Males, MD  albuterol (PROVENTIL) (2.5 MG/3ML) 0.083% nebulizer solution Take 3 mLs (2.5 mg total) by nebulization every 2 (two) hours as needed for wheezing or shortness of breath. 01/27/14  Yes Brand Males, MD  mometasone-formoterol (DULERA) 200-5 MCG/ACT AERO Inhale 2 puffs into the lungs 2 (two) times daily. 08/05/14  Yes Brand Males, MD  Nutritional Supplements (ENSURE HIGH PROTEIN PO) Take 1 Can by mouth daily.    Yes Historical Provider, MD  RAPAFLO 8 MG CAPS capsule Take 1 capsule by mouth daily. 03/04/14  Yes Historical Provider, MD   Physical Exam: Filed Vitals:   08/26/14 1434 08/26/14 1500 08/26/14 1545 08/26/14 1615  BP:  116/63 110/52 127/63  Pulse:  106 103 96  Temp:      Resp:  25 31 22   Height: 5\' 7"  (1.702 m)     Weight: 46.267 kg (102 lb)     SpO2:  100% 98% 100%     General:  No apparent distress, pleasant AA  male  Eyes: PERRL, EOMI, no scleral icterus  ENT: moist oropharynx  Neck: supple, no lymphadenopathy  Cardiovascular: regular rate without MRG; 2+ peripheral pulses, no JVD, no peripheral edema  Respiratory: Very faint wheezing, overall decreased breath sounds  Abdomen: soft, non tender to palpation, positive bowel sounds, no guarding, no rebound  Skin: no rashes  Musculoskeletal: normal bulk and tone, no joint swelling  Psychiatric: normal mood and affect  Neurologic: CN 2-12 grossly intact, MS 5/5 in all 4  Labs on Admission:  Basic Metabolic  Panel:  Recent Labs Lab 08/26/14 1527  NA 140  K 3.8  CL 107  CO2 24  GLUCOSE 143*  BUN 11  CREATININE 0.90  CALCIUM 8.7   Liver Function Tests:  Recent Labs Lab 08/26/14 1527  AST 21  ALT 9  ALKPHOS 64  BILITOT 2.3*  PROT 6.6  ALBUMIN 3.4*   CBC:  Recent Labs Lab 08/26/14 1527  WBC 9.0  NEUTROABS 8.2*  HGB 11.7*  HCT 35.9*  MCV 85.7  PLT 216   Cardiac Enzymes:  Recent Labs Lab 08/26/14 1527  TROPONINI <0.03    BNP (last 3 results)  Recent Labs  09/24/13 0824 12/30/13 2305  PROBNP 87.6 59.7   CBG: No results for input(s): GLUCAP in the last 168 hours.  Radiological Exams on Admission: Dg Chest Portable 1 View  08/26/2014   CLINICAL DATA:  Shortness of breath for 2 days.  Right chest pain.  EXAM: PORTABLE CHEST - 1 VIEW  COMPARISON:  08/12/2014  FINDINGS: There is hyperinflation of the lungs compatible with COPD. Left basilar airspace opacity, new since prior study concerning for developing infiltrate/pneumonia. Right lung is clear. Heart is normal size. Mediastinal contours are within normal limits. No acute bony abnormality.  IMPRESSION: COPD.  New left lower lobe opacity concerning for pneumonia.   Electronically Signed   By: Rolm Baptise M.D.   On: 08/26/2014 14:56    EKG: Independently reviewed. Sinus tachycardia  Assessment/Plan Active Problems:   TOBACCO ABUSE-HISTORY OF   CAP (community acquired pneumonia)   COPD exacerbation   Protein-calorie malnutrition, severe   Acute on chronic respiratory failure with hypoxemia   COPD with acute exacerbation in the setting of CAP - We'll admit patient to telemetry, already received ceftriaxone and azithromycin in the emergency room, start levofloxacin in the morning. - Received Solu-Medrol prior to coming to the ED, little wheezing appreciated, start prednisone at 40 mg - Continue nebulizers  Community-acquired pneumonia - Patient without recent hospitalization, antibiotics as above, has  low-grade temp but not fully febrile, mildly tachycardic and without leukocytosis - check sputum analysis, Streptococcus pneumo urinary antigen, Legionella antigen, HIV  Acute on chronic respiratory failure with hypoxia - Patient on 3 L nasal cannula at home, however requiring 4 L in the emergency room, this is likely due to acute exacerbation of COPD in the setting of community-acquired pneumonia  Tobacco abuse, in remission  Chest pain - atypical, pleuritic in nature, cycle troponins overnight however  Mild normocytic anemia - likely in the setting of chronic illness - Monitor  Diet: regular Fluids: none DVT Prophylaxis: Lovenox  Code Status: Full  Family Communication: d/w daughters bedside  Disposition Plan: admit to telemetry   Nadja Lina M. Cruzita Lederer, MD [facility] Pager [phone]  If 7PM-7AM, please contact night-coverage [email or web address] Password [email or web address] 08/26/2014, 5:23 PM

## 2014-08-26 NOTE — ED Provider Notes (Signed)
CSN: [id number]     Arrival date & time 08/26/14  1425 History   None    Chief Complaint  Patient presents with  . Shortness of Breath     (Consider location/radiation/quality/duration/timing/severity/associated sxs/prior Treatment) Patient is a 80 y.o. male presenting with shortness of breath. The history is provided by the patient.  Shortness of Breath Severity:  Moderate Onset quality:  Gradual Duration:  1 day Timing:  Constant Progression:  Worsening Chronicity:  Recurrent Relieved by:  Inhaler Worsened by:  Deep breathing and coughing Ineffective treatments:  None tried Associated symptoms: chest pain, cough and wheezing   Associated symptoms: no abdominal pain, no diaphoresis, no fever, no headaches, no rash, no sore throat and no vomiting     Past Medical History  Diagnosis Date  . Chronic airway obstruction, not elsewhere classified     on home O2 at night  . BPH (benign prostatic hypertrophy)   . Tobacco abuse   . Weight loss   . Shingles rash 11/13/2011  . Shortness of breath    Past Surgical History  Procedure Laterality Date  . No past surgeries     Family History  Problem Relation Age of Onset  . COPD Father    History  Substance Use Topics  . Smoking status: Former Smoker -- 1.50 packs/day for 40 years    Types: Cigarettes    Quit date: 08/29/2007  . Smokeless tobacco: Never Used  . Alcohol Use: No    Review of Systems  Constitutional: Negative for fever, diaphoresis, activity change and appetite change.  HENT: Negative for facial swelling, sore throat, tinnitus, trouble swallowing and voice change.   Eyes: Negative for pain, redness and visual disturbance.  Respiratory: Positive for cough, shortness of breath and wheezing. Negative for chest tightness.   Cardiovascular: Positive for chest pain. Negative for palpitations and leg swelling.  Gastrointestinal: Negative for nausea, vomiting, abdominal pain, diarrhea, constipation and abdominal  distention.  Endocrine: Negative.   Genitourinary: Negative.  Negative for dysuria, decreased urine volume, scrotal swelling and testicular pain.  Musculoskeletal: Negative for myalgias, back pain and gait problem.  Skin: Negative.  Negative for rash.  Neurological: Negative.  Negative for dizziness, tremors, weakness and headaches.  Psychiatric/Behavioral: Negative for suicidal ideas, hallucinations and self-injury. The patient is not nervous/anxious.       Allergies  Review of patient's allergies indicates no known allergies.  Home Medications   Prior to Admission medications   Medication Sig Start Date End Date Taking? Authorizing Provider  Aclidinium Bromide (TUDORZA PRESSAIR) 400 MCG/ACT AEPB INHALE 1 PUFF INTO THE LUNGS 2 (TWO) TIMES DAILY. Patient taking differently: Inhale 1 puff into the lungs 2 (two) times daily.  04/06/14  Yes Brand Males, MD  albuterol (PROVENTIL HFA;VENTOLIN HFA) 108 (90 BASE) MCG/ACT inhaler Inhale 2 puffs into the lungs every 4 (four) hours as needed for wheezing or shortness of breath. 01/27/14  Yes Brand Males, MD  albuterol (PROVENTIL) (2.5 MG/3ML) 0.083% nebulizer solution Take 3 mLs (2.5 mg total) by nebulization every 2 (two) hours as needed for wheezing or shortness of breath. 01/27/14  Yes Brand Males, MD  mometasone-formoterol (DULERA) 200-5 MCG/ACT AERO Inhale 2 puffs into the lungs 2 (two) times daily. 08/05/14  Yes Brand Males, MD  Nutritional Supplements (ENSURE HIGH PROTEIN PO) Take 1 Can by mouth daily.    Yes Historical Provider, MD  RAPAFLO 8 MG CAPS capsule Take 1 capsule by mouth daily. 03/04/14  Yes Historical Provider, MD   BP  127/63 mmHg  Pulse 96  Temp(Src) 100.1 F (37.8 C)  Resp 22  Ht 5\' 7"  (1.702 m)  Wt 102 lb (46.267 kg)  BMI 15.97 kg/m2  SpO2 100% Physical Exam  Constitutional: He is oriented to person, place, and time. He appears well-developed. No distress.  Chronically ill appearing  HENT:  Head:  Normocephalic and atraumatic.  Right Ear: External ear normal.  Left Ear: External ear normal.  Nose: Nose normal.  Mouth/Throat: Oropharynx is clear and moist.  Eyes: Conjunctivae and EOM are normal. Pupils are equal, round, and reactive to light. No scleral icterus.  Neck: Normal range of motion. Neck supple. No JVD present. No tracheal deviation present. No thyromegaly present.  Cardiovascular: Normal rate and intact distal pulses.  Exam reveals no gallop and no friction rub.   No murmur heard. Pulmonary/Chest: No stridor. No respiratory distress. He has wheezes (mild wheezing in bilateral apical lung fields). He has no rales.  Reduced air movement diffusely  Abdominal: Soft. He exhibits no distension. There is no tenderness. There is no rebound and no guarding.  Musculoskeletal: Normal range of motion. He exhibits no edema or tenderness.  Neurological: He is alert and oriented to person, place, and time. No cranial nerve deficit. He exhibits normal muscle tone. Coordination normal.  Skin: Skin is warm and dry. No rash noted. He is not diaphoretic.  Psychiatric: He has a normal mood and affect. His behavior is normal.  Nursing note and vitals reviewed.   ED Course  Procedures (including critical care time) Labs Review Labs Reviewed  CBC WITH DIFFERENTIAL - Abnormal; Notable for the following:    RBC 4.19 (*)    Hemoglobin 11.7 (*)    HCT 35.9 (*)    RDW 16.1 (*)    Neutrophils Relative % 92 (*)    Neutro Abs 8.2 (*)    Lymphocytes Relative 3 (*)    Lymphs Abs 0.3 (*)    All other components within normal limits  COMPREHENSIVE METABOLIC PANEL - Abnormal; Notable for the following:    Glucose, Bld 143 (*)    Albumin 3.4 (*)    Total Bilirubin 2.3 (*)    GFR calc non Af Amer 77 (*)    GFR calc Af Amer 89 (*)    All other components within normal limits  CULTURE, BLOOD (ROUTINE X 2)  CULTURE, BLOOD (ROUTINE X 2)  TROPONIN I  BRAIN NATRIURETIC PEPTIDE    Imaging Review Dg  Chest Portable 1 View  08/26/2014   CLINICAL DATA:  Shortness of breath for 2 days.  Right chest pain.  EXAM: PORTABLE CHEST - 1 VIEW  COMPARISON:  08/12/2014  FINDINGS: There is hyperinflation of the lungs compatible with COPD. Left basilar airspace opacity, new since prior study concerning for developing infiltrate/pneumonia. Right lung is clear. Heart is normal size. Mediastinal contours are within normal limits. No acute bony abnormality.  IMPRESSION: COPD.  New left lower lobe opacity concerning for pneumonia.   Electronically Signed   By: Rolm Baptise M.D.   On: 08/26/2014 14:56     EKG Interpretation   Date/Time:  Wednesday August 26 2014 14:30:51 EST Ventricular Rate:  118 PR Interval:  123 QRS Duration: 135 QT Interval:  385 QTC Calculation: 539 R Axis:   -94 Text Interpretation:  Sinus tachycardia with irregular rate Nonspecific  IVCD with LAD Inferior infarct, old Repol abnrm, severe global ischemia  (LM/MVD) Rate faster Confirmed by Wyvonnia Dusky  MD, [place] [phone]) on  08/26/2014 2:34:22  PM      MDM   Final diagnoses:  CAP (community acquired pneumonia)    Patient is a 80 y.o. M with COPD who presents for 1 day of coughing and SOB. Increased O2 requirement to 4L from home of 3L. Chest xray shows new left lower lobe opacity concerning for pna. Labs including blood cultures drawn, patient given abx and admitted for further treatment.  Patient seen with attending, Dr. Wilson Singer, who oversaw clinical decision making.   Margaretann Loveless, MD 08/26/14 1659  Virgel Manifold, MD 08/27/14 [phone]

## 2014-08-26 NOTE — Progress Notes (Signed)
Pt admitted to the unit at 1820. Pt mental status is A&Ox4. Pt oriented to room, staff, and call bell. Skin is intact.Call bell within reach. Visitor guidelines reviewed w/ pt and/or family. Safety video viewed.

## 2014-08-27 LAB — CBC
HCT: 33.3 % — ABNORMAL LOW (ref 39.0–52.0)
HEMOGLOBIN: 10.8 g/dL — AB (ref 13.0–17.0)
MCH: 27 pg (ref 26.0–34.0)
MCHC: 32.4 g/dL (ref 30.0–36.0)
MCV: 83.3 fL (ref 78.0–100.0)
Platelets: 195 10*3/uL (ref 150–400)
RBC: 4 MIL/uL — AB (ref 4.22–5.81)
RDW: 15.9 % — ABNORMAL HIGH (ref 11.5–15.5)
WBC: 12.4 10*3/uL — ABNORMAL HIGH (ref 4.0–10.5)

## 2014-08-27 LAB — COMPREHENSIVE METABOLIC PANEL
ALBUMIN: 3 g/dL — AB (ref 3.5–5.2)
ALT: 10 U/L (ref 0–53)
ANION GAP: 6 (ref 5–15)
AST: 21 U/L (ref 0–37)
Alkaline Phosphatase: 54 U/L (ref 39–117)
BUN: 11 mg/dL (ref 6–23)
CHLORIDE: 106 meq/L (ref 96–112)
CO2: 27 mmol/L (ref 19–32)
CREATININE: 0.92 mg/dL (ref 0.50–1.35)
Calcium: 8.5 mg/dL (ref 8.4–10.5)
GFR calc Af Amer: 88 mL/min — ABNORMAL LOW (ref >90)
GFR, EST NON AFRICAN AMERICAN: 76 mL/min — AB (ref >90)
Glucose, Bld: 198 mg/dL — ABNORMAL HIGH (ref 70–99)
POTASSIUM: 3.8 mmol/L (ref 3.5–5.1)
SODIUM: 139 mmol/L (ref 135–145)
Total Bilirubin: 1.2 mg/dL (ref 0.3–1.2)
Total Protein: 6.9 g/dL (ref 6.0–8.3)

## 2014-08-27 LAB — STREP PNEUMONIAE URINARY ANTIGEN: Strep Pneumo Urinary Antigen: NEGATIVE

## 2014-08-27 LAB — TROPONIN I
TROPONIN I: 0.04 ng/mL — AB (ref <0.031)
TROPONIN I: 0.04 ng/mL — AB (ref <0.031)
Troponin I: 0.04 ng/mL — ABNORMAL HIGH (ref <0.031)

## 2014-08-27 LAB — HIV ANTIBODY (ROUTINE TESTING W REFLEX): HIV 1&2 Ab, 4th Generation: NONREACTIVE

## 2014-08-27 MED ORDER — PREDNISONE 20 MG PO TABS: 40.0000 mg | ORAL_TABLET | Freq: Every day | ORAL | Status: DC

## 2014-08-27 MED ORDER — LEVOFLOXACIN IN D5W 250 MG/50ML IV SOLN: 250.0000 mg | INTRAVENOUS | Status: DC

## 2014-08-27 MED ORDER — LEVOFLOXACIN 500 MG PO TABS: 500.0000 mg | ORAL_TABLET | Freq: Every day | ORAL | Status: DC

## 2014-08-27 NOTE — Progress Notes (Signed)
Frank Garcia to be D/C'd Home per MD order.  Discussed with the patient and all questions fully answered.    Medication List    TAKE these medications        Aclidinium Bromide 400 MCG/ACT Aepb  Commonly known as:  TUDORZA PRESSAIR  INHALE 1 PUFF INTO THE LUNGS 2 (TWO) TIMES DAILY.     albuterol 108 (90 BASE) MCG/ACT inhaler  Commonly known as:  PROVENTIL HFA;VENTOLIN HFA  Inhale 2 puffs into the lungs every 4 (four) hours as needed for wheezing or shortness of breath.     albuterol (2.5 MG/3ML) 0.083% nebulizer solution  Commonly known as:  PROVENTIL  Take 3 mLs (2.5 mg total) by nebulization every 2 (two) hours as needed for wheezing or shortness of breath.     ENSURE HIGH PROTEIN PO  Take 1 Can by mouth daily.     levofloxacin 500 MG tablet  Commonly known as:  LEVAQUIN  Take 1 tablet (500 mg total) by mouth daily.     mometasone-formoterol 200-5 MCG/ACT Aero  Commonly known as:  DULERA  Inhale 2 puffs into the lungs 2 (two) times daily.     predniSONE 20 MG tablet  Commonly known as:  DELTASONE  Take 2 tablets (40 mg total) by mouth daily with breakfast.     RAPAFLO 8 MG Caps capsule  Generic drug:  silodosin  Take 1 capsule by mouth daily.        VVS, Skin clean, dry and intact without evidence of skin break down, no evidence of skin tears noted. IV catheter discontinued intact. Site without signs and symptoms of complications. Dressing and pressure applied.  An After Visit Summary was printed and given to the patient.  D/c education completed with patient/family including follow up instructions, medication list, d/c activities limitations if indicated, with other d/c instructions as indicated by MD - patient able to verbalize understanding, all questions fully answered.   Patient instructed to return to ED, call 911, or call MD for any changes in condition.   Patient escorted via [place], and D/C home via private auto.  [name] 08/27/2014 12:39  PM

## 2014-08-27 NOTE — Progress Notes (Signed)
Medicare Important Message given? YES   (If response is "NO", the following Medicare IM given date fields will be blank)   Date Medicare IM given:   Medicare IM given by: Graves-Bigelow, Humbert Morozov  

## 2014-08-27 NOTE — Progress Notes (Signed)
UR Completed Sherrod Toothman Graves-Bigelow, RN,BSN [phone]  

## 2014-08-27 NOTE — Discharge Summary (Signed)
Physician Discharge Summary  Frank Garcia:[id number] DOB: 1929/07/29 DOA: 08/26/2014  PCP: Brand Males, MD  Admit date: 08/26/2014 Discharge date: 08/27/2014  Time spent: 35 minutes  Recommendations for Outpatient Follow-up:  1.  Follow up cxr to ensure resolution of infiltrates. 2.  Follow up with Dr. Chase Caller next week as scheduled  3.  Please check finalized Urine for legionella antigen and finalized blood cultures.  Discharge Diagnoses:  Principal Problem:   CAP (community acquired pneumonia) Active Problems:   TOBACCO ABUSE-HISTORY OF   COPD exacerbation   Protein-calorie malnutrition, severe   Acute on chronic respiratory failure with hypoxemia  Discharge Condition: stable.  Diet recommendation: heart healthy with protein supplements  Filed Weights   08/26/14 1434  Weight: 46.267 kg (102 lb)    History of present illness:  Frank Garcia is a 80 y.o. male has a past medical history significant for COPD gold Stage III on chronic home oxygen.  He is  followed regularly by Frank Garcia from pulmonology.  He presented to the emergency room with the chief complaint of acute on chronic shortness of breath, worsening over the last 24 hours, also with pleuritic type chest pain on the left side, as well as cough with sputum production.   Hospital Course:   Community Acquired Pneumonia LLL opacity seen on CXR.  He was treated with Azith and Rocephin in the ER, then started on levaquin on the floor.  His HIV was non reactive.  Strep pneumo urinary antigen was negative.  Blood cultures showed NGTD at the time of discharge.  On the morning after admission, Mr. Balthazor told me that his chest pain was resolved, he was coughing less and he felt remarkably better and requesting discharge. He was able to ambulate in the hallway without almost any assistance, on 3L [place] which is his chronic home oxygen level. He has close follow up with his Pulmonologist in 5 days. He was discharged on  antibiotics and steroid taper.  COPD with exacerbation. Mr. Kolbe was treated with antibiotics, nebulizers, and steroids.  At the time of discharge he was on [place] usual home oxygen requirement. Tobacco abuse, in remission Pleuritic chest pain - atypical, pleuritic in nature, cycle troponins overnight. He had a borderline troponin leak to 0.04 which is likely mild demand due to #1.  Mild normocytic anemia - likely in the setting of chronic illness.  Procedures:  none  Consultations:  none  Discharge Exam: Filed Vitals:   08/27/14 0846 08/27/14 1059 08/27/14 1110 08/27/14 1151  BP:      Pulse: 89   88  Temp:      TempSrc:      Resp: 18   19  Height:      Weight:      SpO2: 100% 100% 96% 96%   General:  Well developed, thin, frail, elderly male, sitting on the side of the bed, exceptionally pleasant.  Wearing N/C. CV: RRR no M/R/G, no JVD, no LEE Respiratory:  No wheezing heard.  Mildly decreased air movement.  No increased work of breathing. Abdomen:  Thin, soft, nt, nd, +Bs Extremities:  5/5 strength in each.  Able to ambulate.  No edema.  Discharge Instructions  Discharge Medication List as of 08/27/2014 12:51 PM    START taking these medications   Details  levofloxacin (LEVAQUIN) 500 MG tablet Take 1 tablet (500 mg total) by mouth daily., Starting 08/27/2014, Until Discontinued, Print    predniSONE (DELTASONE) 20 MG tablet Take 2 tablets (40 mg  total) by mouth daily with breakfast., Starting 08/27/2014, Until Discontinued, Print      CONTINUE these medications which have NOT CHANGED   Details  Aclidinium Bromide (TUDORZA PRESSAIR) 400 MCG/ACT AEPB INHALE 1 PUFF INTO THE LUNGS 2 (TWO) TIMES DAILY., Normal    albuterol (PROVENTIL HFA;VENTOLIN HFA) 108 (90 BASE) MCG/ACT inhaler Inhale 2 puffs into the lungs every 4 (four) hours as needed for wheezing or shortness of breath., Starting 01/27/2014, Until Discontinued, Normal    albuterol (PROVENTIL) (2.5 MG/3ML) 0.083%  nebulizer solution Take 3 mLs (2.5 mg total) by nebulization every 2 (two) hours as needed for wheezing or shortness of breath., Starting 01/27/2014, Until Discontinued, Normal    mometasone-formoterol (DULERA) 200-5 MCG/ACT AERO Inhale 2 puffs into the lungs 2 (two) times daily., Starting 08/05/2014, Until Discontinued, Normal    Nutritional Supplements (ENSURE HIGH PROTEIN PO) Take 1 Can by mouth daily. , Until Discontinued, Historical Med    RAPAFLO 8 MG CAPS capsule Take 1 capsule by mouth daily., Starting 03/04/2014, Until Discontinued, Historical Med       No Known Allergies Follow-up Information    Follow up with [facility], MD. Schedule an appointment as soon as possible for a visit in 1 week.   Specialty:  Pulmonary Disease   Contact information:   [place] [id number] [phone]        The results of significant diagnostics from this hospitalization (including imaging, microbiology, ancillary and laboratory) are listed below for reference.    Significant Diagnostic Studies: Dg Chest 2 View  08/12/2014   CLINICAL DATA:  Followup pneumonia, history of COPD  EXAM: CHEST  2 VIEW  COMPARISON:  07/28/2014  FINDINGS: Cardiac shadow is within normal limits. The lungs are again hyperinflated consistent with COPD. The left lung remains clear. The right apex again demonstrates some mild increased density superiorly and medially although the overall appearance has improved. Continued short-term follow-up in 2-3 weeks is recommended. No other focal abnormality is noted.  IMPRESSION: Improvement in the apical density on the right. Continued followup is recommended in 2-3 weeks. If persistent density remains CT of the chest is recommended.   Electronically Signed   By: Inez Catalina M.D.   On: 08/12/2014 11:59   Dg Chest 2 View  07/29/2014   CLINICAL DATA:  COPD.  Right side chest pain.  EXAM: CHEST  2 VIEW  COMPARISON:  PA and lateral chest 01/27/2014 and 12/30/2013.   FINDINGS: The lungs are markedly emphysematous. There is new airspace disease in the right lung apex. The left lung is clear. Heart size is normal No pneumothorax is identified.  IMPRESSION: New airspace disease in the right lung apex is worrisome for infection, possibly atypical. Followup films to clearing are recommended to exclude underlying neoplasm.  Emphysema.   Electronically Signed   By: Inge Rise M.D.   On: 07/29/2014 08:41   Dg Chest Portable 1 View  08/26/2014   CLINICAL DATA:  Shortness of breath for 2 days.  Right chest pain.  EXAM: PORTABLE CHEST - 1 VIEW  COMPARISON:  08/12/2014  FINDINGS: There is hyperinflation of the lungs compatible with COPD. Left basilar airspace opacity, new since prior study concerning for developing infiltrate/pneumonia. Right lung is clear. Heart is normal size. Mediastinal contours are within normal limits. No acute bony abnormality.  IMPRESSION: COPD.  New left lower lobe opacity concerning for pneumonia.   Electronically Signed   By: Rolm Baptise M.D.   On: 08/26/2014 14:56  Microbiology: Recent Results (from the past 240 hour(s))  Blood culture (routine x 2)     Status: None (Preliminary result)   Collection Time: 08/26/14  3:25 PM  Result Value Ref Range Status   Specimen Description BLOOD RIGHT ARM  Final   Special Requests BOTTLES DRAWN AEROBIC AND ANAEROBIC 5 CC  Final   Culture   Final           BLOOD CULTURE RECEIVED NO GROWTH TO DATE CULTURE WILL BE HELD FOR 5 DAYS BEFORE ISSUING A FINAL NEGATIVE REPORT Performed at [facility]    Report Status PENDING  Incomplete  Blood culture (routine x 2)     Status: None (Preliminary result)   Collection Time: 08/26/14  3:29 PM  Result Value Ref Range Status   Specimen Description BLOOD RIGHT HAND  Final   Special Requests BOTTLES DRAWN AEROBIC AND ANAEROBIC 5 CC  Final   Culture   Final           BLOOD CULTURE RECEIVED NO GROWTH TO DATE CULTURE WILL BE HELD FOR 5 DAYS BEFORE ISSUING  A FINAL NEGATIVE REPORT Performed at [facility]    Report Status PENDING  Incomplete     Labs: Basic Metabolic Panel:  Recent Labs Lab 08/26/14 1527 08/27/14 0300  NA 140 139  K 3.8 3.8  CL 107 106  CO2 24 27  GLUCOSE 143* 198*  BUN 11 11  CREATININE 0.90 0.92  CALCIUM 8.7 8.5   Liver Function Tests:  Recent Labs Lab 08/26/14 1527 08/27/14 0300  AST 21 21  ALT 9 10  ALKPHOS 64 54  BILITOT 2.3* 1.2  PROT 6.6 6.9  ALBUMIN 3.4* 3.0*   CBC:  Recent Labs Lab 08/26/14 1527 08/27/14 0300  WBC 9.0 12.4*  NEUTROABS 8.2*  --   HGB 11.7* 10.8*  HCT 35.9* 33.3*  MCV 85.7 83.3  PLT 216 195   Cardiac Enzymes:  Recent Labs Lab 08/26/14 1527 08/26/14 2043 08/27/14 0300 08/27/14 0720 08/27/14 0906  TROPONINI <0.03 <0.03 0.04* 0.04* 0.04*   BNP: BNP (last 3 results)  Recent Labs  09/24/13 0824 12/30/13 2305  PROBNP 87.6 59.7    Signed:  Melton Alar, PA-C  [facility] 08/27/2014, 4:44 PM  Patient was seen, examined,treatment plan was discussed with the Advance Practice Provider.  I have directly reviewed the clinical findings, lab, imaging studies and management of this patient in detail. I have made the necessary changes to the above noted documentation, and agree with the documentation, as recorded by the Advance Practice Provider.   Marzetta Board, MD [facility] [phone]

## 2014-08-27 NOTE — Discharge Instructions (Signed)
Follow with RAMASWAMY,MURALI, MD in 5-7 days  Please get a complete blood count and chemistry panel checked by your Primary MD at your next visit, and again as instructed by your Primary MD. Please get your medications reviewed and adjusted by your Primary MD.  Please request your Primary MD to go over all Hospital Tests and Procedure/Radiological results at the follow up, please get all Hospital records sent to your Prim MD by signing hospital release before you go home.  If you had Pneumonia of Lung problems at the Hospital: Please get a 2 view Chest X ray done in 6-8 weeks after hospital discharge or sooner if instructed by your Primary MD.  If you have Congestive Heart Failure: Please call your Cardiologist or Primary MD anytime you have any of the following symptoms:  1) 3 pound weight gain in 24 hours or 5 pounds in 1 week  2) shortness of breath, with or without a dry hacking cough  3) swelling in the hands, feet or stomach  4) if you have to sleep on extra pillows at night in order to breathe  Follow cardiac low salt diet and 1.5 lit/day fluid restriction.  If you have diabetes Accuchecks 4 times/day, Once in AM empty stomach and then before each meal. Log in all results and show them to your primary doctor at your next visit. If any glucose reading is under 80 or above 300 call your primary MD immediately.  If you have Seizure/Convulsions/Epilepsy: Please do not drive, operate heavy machinery, participate in activities at heights or participate in high speed sports until you have seen by Primary MD or a Neurologist and advised to do so again.  If you had Gastrointestinal Bleeding: Please ask your Primary MD to check a complete blood count within one week of discharge or at your next visit. Your endoscopic/colonoscopic biopsies that are pending at the time of discharge, will also need to followed by your Primary MD.  Get Medicines reviewed and adjusted. Please take all your  medications with you for your next visit with your Primary MD  Please request your Primary MD to go over all hospital tests and procedure/radiological results at the follow up, please ask your Primary MD to get all Hospital records sent to his/her office.  If you experience worsening of your admission symptoms, develop shortness of breath, life threatening emergency, suicidal or homicidal thoughts you must seek medical attention immediately by calling 911 or calling your MD immediately  if symptoms less severe.  You must read complete instructions/literature along with all the possible adverse reactions/side effects for all the Medicines you take and that have been prescribed to you. Take any new Medicines after you have completely understood and accpet all the possible adverse reactions/side effects.   Do not drive or operate heavy machinery when taking Pain medications.   Do not take more than prescribed Pain, Sleep and Anxiety Medications  Special Instructions: If you have smoked or chewed Tobacco  in the last 2 yrs please stop smoking, stop any regular Alcohol  and or any Recreational drug use.  Wear Seat belts while driving.  Please note You were cared for by a hospitalist during your hospital stay. If you have any questions about your discharge medications or the care you received while you were in the hospital after you are discharged, you can call the unit and asked to speak with the hospitalist on call if the hospitalist that took care of you is not available. Once you  are discharged, your primary care physician will handle any further medical issues. Please note that NO REFILLS for any discharge medications will be authorized once you are discharged, as it is imperative that you return to your primary care physician (or establish a relationship with a primary care physician if you do not have one) for your aftercare needs so that they can reassess your need for medications and monitor your  lab values.  You can reach the hospitalist office at phone [phone] or fax [phone]   If you do not have a primary care physician, you can call [phone] for a physician referral.  Activity: As tolerated with Full fall precautions use walker/cane & assistance as needed  Diet: regular  Disposition Home

## 2014-08-27 NOTE — Progress Notes (Signed)
INITIAL NUTRITION ASSESSMENT  Pt meets criteria for SEVERE MALNUTRITION in the context of chronic illness as evidenced by severe fat and muscle mass loss.  DOCUMENTATION CODES Per approved criteria  -Severe malnutrition in the context of chronic illness -Underweight   INTERVENTION: Continue Ensure Complete po BID, each supplement provides 350 kcal and 13 grams of protein.  Encourage adequate PO intake.   NUTRITION DIAGNOSIS: Increased nutrient needs related to chronic illness, COPD as evidenced by estimated nutrition needs.   Goal: Pt to meet >/= 90% of their estimated nutrition needs   Monitor:  PO intake, weight trends, labs, I/O's  Reason for Assessment: MD consult for assessment of nutrition requirements/status  80 y.o. male  Admitting Dx: CAP  ASSESSMENT: Pt has a past medical history significant for COPD gold Stage III on chronic home oxygen, followed regularly by [facility] from pulmonology, presents to the emergency room with the chief complaint of acute on chronic shortness of breath, worsening over the last 24 hours, also with pleuritic type chest pain on the left side, as well as cough with sputum production.  Pt reports having a good appetite currently. Meal completion is 100%. Pt reports however having a decreased appetite over the past 2-3 days. He reports despite decreased appetite, he continues to eat well with 2 full meals a day, snacks in between meals, and occasionally Ensure Shakes. Weight has been stable. Pt currently has Ensure BID ordered and has been drinking them. RD to continue with current orders. Pt was educated to continue Ensure at home at least twice a day to aid in caloric and protein needs as well as to prevent further weight loss. Pt was encouraged to eat his food at meals.   Nutrition Focused Physical Exam:  Subcutaneous Fat:  Orbital Region: N/A Upper Arm Region: Severe depletion Thoracic and Lumbar Region: Severe depletion  Muscle:   Temple Region: N/A Clavicle Bone Region: Severe depletion Clavicle and Acromion Bone Region: Severe depletion Scapular Bone Region: Severe depletion Dorsal Hand: N/A Patellar Region: Severe depletion Anterior Thigh Region: Severe depletion Posterior Calf Region: Severe depletion  Edema: none  Height: Ht Readings from Last 1 Encounters:  08/26/14 5\' 7"  (1.702 m)    Weight: Wt Readings from Last 1 Encounters:  08/26/14 102 lb (46.267 kg)    Ideal Body Weight: 148 lbs  % Ideal Body Weight: 69%  Wt Readings from Last 10 Encounters:  08/26/14 102 lb (46.267 kg)  08/12/14 102 lb 12.8 oz (46.63 kg)  07/28/14 102 lb 3.2 oz (46.358 kg)  06/24/14 101 lb 12.8 oz (46.176 kg)  05/07/14 101 lb (45.813 kg)  04/10/14 103 lb (46.72 kg)  02/23/14 100 lb (45.36 kg)  01/27/14 103 lb (46.72 kg)  01/01/14 100 lb 8.5 oz (45.6 kg)  11/17/13 109 lb 3.2 oz (49.533 kg)   Usual Body Weight: 109 lbs  % Usual Body Weight: 94%  BMI:  Body mass index is 15.97 kg/(m^2). Underweight  Estimated Nutritional Needs: Kcal: 1600-1800 Protein: 65-75 grams Fluid: 1.6 - 1.8 L/day  Skin: intact  Diet Order: Diet regular  EDUCATION NEEDS: -Education needs addressed   Intake/Output Summary (Last 24 hours) at 08/27/14 0926 Last data filed at 08/27/14 0511  Gross per 24 hour  Intake    100 ml  Output    100 ml  Net      0 ml    Last BM: 12/30  Labs:   Recent Labs Lab 08/26/14 1527 08/27/14 0300  NA 140 139  K  3.8 3.8  CL 107 106  CO2 24 27  BUN 11 11  CREATININE 0.90 0.92  CALCIUM 8.7 8.5  GLUCOSE 143* 198*    CBG (last 3)  No results for input(s): GLUCAP in the last 72 hours.  Scheduled Meds: . budesonide (PULMICORT) nebulizer solution  0.25 mg Nebulization BID  . enoxaparin (LOVENOX) injection  30 mg Subcutaneous Q24H  . feeding supplement (ENSURE COMPLETE)  237 mL Oral BID BM  . ipratropium-albuterol  3 mL Nebulization Q4H  . [START ON 08/28/2014] levofloxacin (LEVAQUIN)  IV  250 mg Intravenous Q24H  . predniSONE  40 mg Oral Q breakfast  . sodium chloride  3 mL Intravenous Q12H    Continuous Infusions:   Past Medical History  Diagnosis Date  . BPH (benign prostatic hypertrophy)   . Tobacco abuse   . Weight loss   . Shingles rash 11/13/2011  . Shortness of breath   . Asthma   . On home oxygen therapy     "3L; all night; & prn" (08/26/2014)  . Chronic airway obstruction, not elsewhere classified     on home O2 at night  . Pneumonia     "a few times" (08/26/2014)    Past Surgical History  Procedure Laterality Date  . No past surgeries      Kallie Locks, MS, RD, LDN Pager # [phone] After hours/ weekend pager # [phone]

## 2014-08-27 NOTE — Progress Notes (Signed)
Chaplain responded to spiritual care consult for pt requesting prayer. Chaplain encountered pt as he was being discharged. Chaplain offered prayer and blessing for journey home.  Montrice Gracey, Barbette Hair, Chaplain 08/27/2014 1:20 PM

## 2014-08-30 LAB — LEGIONELLA ANTIGEN, URINE

## 2014-08-31 ENCOUNTER — Encounter: Payer: Self-pay | Admitting: Internal Medicine

## 2014-08-31 ENCOUNTER — Ambulatory Visit (INDEPENDENT_AMBULATORY_CARE_PROVIDER_SITE_OTHER): Payer: Medicare Other | Admitting: Internal Medicine

## 2014-08-31 ENCOUNTER — Telehealth: Payer: Self-pay | Admitting: *Deleted

## 2014-08-31 VITALS — BP 132/68 | HR 70 | Ht 67.0 in | Wt 101.6 lb

## 2014-08-31 DIAGNOSIS — J189 Pneumonia, unspecified organism: Secondary | ICD-10-CM

## 2014-08-31 DIAGNOSIS — E43 Unspecified severe protein-calorie malnutrition: Secondary | ICD-10-CM

## 2014-08-31 DIAGNOSIS — J449 Chronic obstructive pulmonary disease, unspecified: Secondary | ICD-10-CM

## 2014-08-31 LAB — CULTURE, BLOOD (ROUTINE X 2)

## 2014-08-31 MED ORDER — LEVOFLOXACIN 500 MG PO TABS: 500.0000 mg | ORAL_TABLET | Freq: Every day | ORAL | Status: DC

## 2014-08-31 NOTE — Telephone Encounter (Signed)
FYI for MR - Spoke with Nira Conn from [place] - Pt had positive blood culture from 08/26/14 - 1st set - Anerobic bottle - Gram positive rods for Bacilus species - this was preliminary and will be finalized shorty.

## 2014-08-31 NOTE — Patient Instructions (Signed)
ICD-9-CM ICD-10-CM   1. CAP (community acquired pneumonia) 71 J18.9   2. COPD, very severe 496 J44.9   3. Protein-calorie malnutrition, severe 262 E43     - ok to change from ENsure to BOOST - talk to PCP about appetite boosting drugs - continue o2 and inhalers for your copd  - extend levaquin treatment through and last date of Sep 04, 2013 based on blood culture results  #Followup  cxr 2 view in 6 weeks   6 weeks with me or NP Tammy Parrett but return sooner if needed

## 2014-08-31 NOTE — Progress Notes (Signed)
Subjective:    Patient ID: Frank Garcia, male    DOB: 11/15/1928, 81 y.o.   MRN: [id number]  HPI    #FU Gold stage 3 copd - MM genotype  - feb 2009, fev1 1.13L/46% while on spiriva and advair with ex-desat, - June 2012:  Spirometry today (reviewed) - fev1 0.95L/38% and is a 110cc loss in fev1 in 3 years which approx 35cc/year - CAT score 16 Jan 2012 - Dec 2014: spiriva changed to tudorza due to urinary retention - Jan 2015: advair changed to [facility]  # pulmonary nodules, - RUL apical scar like density  - 2010 and 2011 -absent  - 2012 and 2013 - new and stable -03/24/13  - progression to 1.6cm    -  Onncimmne test reesult 04/29/13 is LOW level for 7 ung cancer proteins.  - Xpresys indi test done 04/29/13 is LIKELY BENGIN for nodule at 86% probability - 09/01/13 - stabl bilateral UL scarring. REC: annual low dose CT  # ex-smoker,   #Cachexia   - Body mass index is 18.17 kg/(m^2). on 11/13/2011 (weight 116# and up 5# since June 2012)  - - Body mass index is 16.94 kg/(m^2). on 06/09/2013 - Body mass index is 15.88 kg/(m^2). on 01/27/2014 - Body mass index is 16.06 kg/(m^2). on 05/07/2014    #Shingles - MArch 2013     #Recurrent COPD exacerbation - March 2013: Office treatment for COPD exacerbation - May 2013: Office treatment with doxycycline and prednisone for COPD exacerbation  - August 2030: Started roflumilast May 2-14 - office RX Possible COPDAE./ Rx doxy and pred Dec 2014 - office Rx Prdnisone and Augmentin. STopped roflumilast Mary 2015 - admit LLL PNA - Pneumoccoccal CAP  #hoarse voice - ENT evaluation and 2013 normal   OV 01/27/2014  Chief Complaint  Patient presents with  . Follow-up    Pt hospitalized in May for pna. Pt c/o cough with white and clear mucous, increase in SOB with little activity. Pt has not had much mobility since 5/27. C/o intermittent chest tightness.    Followup for the above. since I last saw him he was admitted 12/30/2013 with a left lower lobe  pneumococcal pneumonia that was confirmed by urine strep antigen. Since discharge he is feeling better. He presents with his daughter today for followup.the admission is left him with continued weight loss, cachexia and poor appetite despite drinking protein. However, for the last 3 days he has increased cough, dyspnea, increasing sputum volume and slight increase in color to thick white. He feels COPD exacerbation again. He and his daughter have questions on prognosis and overall expectation. He does not have home physical therapy. He is very interested in copd research trials HE continues his copd tudorza, breo and alb nebs and 3L o2 without fail  I think you are in copd flare up again HAve CXR 2 view to ensure pneumonia from MAy 2015 clearing up Take doxycycline 100mg  po twice daily x 5 days; take after meals and avoid sunlight Take prednisone 40 mg daily x 2 days, then 20mg  daily x 2 days, then 10mg  daily x 2 days, then 5mg  daily x 2 days and stop Continue o2 and other copd nebs as before\ We will arrange home PT for you to help with strength  #WEight loss  - likely due to copd but talk to pcp No PCP Per Patient    Followup  4 weeks or sooner if needed   OV 02/23/2014  Chief Complaint  Patient presents with  . Follow-up    Pt states with activity outdoors he becomes SOB. C/o increase in cough with intermittent yellow/orange mucous. Denies CP.     He has recovered o from recent COPD exacerbation. Improve dyspnea and improved chest tightness and improve wheezing. Improve fatigue.He is complaining of coust of BREO. Taking tudorzan and o2.  Although, he is having chronic sinus drainage that has returned along with significant cough with associated secretions. He is not doing his nasal steroids and netti pot any part anymore. >>no changes  04/10/2014 Follow up COPD  Returns for 6 week COPD follow up.  Reports breathing is doing well overall, does note some occasional prod cough with light  yellow mucus x2 weeks.  Does complain of thick mucus in am, once gets up then he feels much bette.r   Denies increased SOB, wheezing, tightness, f/c/s, n/v/d, hemoptysis.  no refills needed at this time. Remains on [place] .  CXR 01/2014 w/ chronic changes and no acute process.  Wt is better, up 3 lbs, eating some better  Discussed prevnar vaccine today.    OV 05/07/2014  Chief Complaint  Patient presents with  . Follow-up    Pt states when taking mucinex he has urinary retention. Pt states he has increase in SOB, PND, prod cough with orange and clear mucous x 1week. Pt denies CP/tightness.    Followup severe COPD and chronic respiratory failure with multiple recurrent exacerbations. \ \ Since last visit with me in June 2015 and falling with nurse practitioner in August 2015 overall he has been stable. He has been compliant with his oxygen and his triple combination inhaler therapy. Follow for the past few days he's had increased cough compared to baseline associated with change in weakness of sputum with some change in color of sputum nor change in dyspnea wheezing. Denies any chest pain fever chills or edema. He is frustrated about recurrent  COPD exacerbation. He does not want prednisone preferably if it can be avoided >>zpack   06/24/14 Acute OV  Complains of MR pt here for prod cough with light yellow mucus, increased SOB, some wheezing and tightness x2 days.  Taking mucinex without much help.  Denies f/c/s, n/v/d, hemoptysis., chest pain, orthopnea , edema.  Remains on [place].  >doxycyline rx  07/28/14  Acute OV  Pt complains of increased SOB, some prod cough with light tan mucus, PND, some sharp pain in the right lung w/ coughing x2 days. Denies f/c/s, n/v/d, hemoptysis, leg swelling.  Remains on [place].  On O2 3/m .  Had similar symptoms last visit 1 month ago was treated with doxycycline and a prednisone taper. Says that his symptoms totally  resolved and was improved up until 2 days ago. >>Levaquin rx   08/12/2014 Follow up PNA  Pt seen last visit for sick visit found to have new right apical PNA .  Tx w/ with Levaquin 750x 7  Returns today feeling much better . Says that he tolerated abx well, no n/v/d.  Cough, dyspnea and wheezing have improved  He denies any chest pain, orthopnea, PND or leg swelling Appetite is bett   OV 08/31/2014  Chief Complaint  Patient presents with  . Follow-up    Pt recently d/c from [facility] for pna. Pt stated his breathing has improved since d/c. Pt c/o mild cough with little mucus production-tan in color. Pt denies CP/tightness.     Followup severe COPD and chronic respiratroy failure  with oxygen dependence and severe protein calorie malnutrition and cachexia  - He was admitted 08/26/2014 through 08/27/14 with a left lower lobe pneumonia and discharged on Levaquin therapy. Last date is tomorrow. HE feels back to baseline. Blood culture is now growing BAcillus species but he denies any ocular, joint symptoms. His main issue is low apppetite for several months despite ENSURE. No new issues.     Review of Systems  Constitutional: Negative for fever and unexpected weight change.  HENT: Negative for congestion, dental problem, ear pain, nosebleeds, postnasal drip, rhinorrhea, sinus pressure, sneezing, sore throat and trouble swallowing.   Eyes: Negative for redness and itching.  Respiratory: Positive for cough and shortness of breath. Negative for chest tightness and wheezing.   Cardiovascular: Negative for palpitations and leg swelling.  Gastrointestinal: Negative for nausea and vomiting.  Genitourinary: Negative for dysuria.  Musculoskeletal: Negative for joint swelling.  Skin: Negative for rash.  Neurological: Negative for headaches.  Hematological: Does not bruise/bleed easily.  Psychiatric/Behavioral: Negative for dysphoric mood. The patient is not nervous/anxious.        Objective:    Physical Exam   Filed Vitals:   08/31/14 1351  BP: 132/68  Pulse: 70  Height: 5\' 7"  (1.702 m)  Weight: 101 lb 9.6 oz (46.085 kg)  SpO2: 98%    Physical Exam GEN: A/Ox3; pleasant , NAD, elderly, frail, thin.   HEENT:  [place]/AT,  EACs-clear, TMs-wnl, NOSE-clear, THROAT-clear, no lesions, no postnasal drip or exudate noted.   NECK:  Supple w/ fair ROM; no JVD; normal carotid impulses w/o bruits; no thyromegaly or nodules palpated; no lymphadenopathy.  RESP  Diminished BS in bases few rhonchi , no accessory muscle use, no dullness to percussion  CARD:  RRR, no m/r/g  , no peripheral edema, pulses intact, no cyanosis or clubbing.  GI:   Soft & nt; nml bowel sounds; no organomegaly or masses detected.  Musco: Warm bil, no deformities or joint swelling noted.   Neuro: alert, no focal deficits noted.    Skin: Warm, no lesions or rashes       Assessment & Plan:     ICD-9-CM ICD-10-CM   1. CAP (community acquired pneumonia) 49 J18.9   2. COPD, very severe 496 J44.9   3. Protein-calorie malnutrition, severe 262 E43     - ok to change from ENsure to BOOST - talk to PCP about appetite boosting drugs - continue o2 and inhalers for your copd  - extend levaquin treatment through and last date of Sep 04, 2013 based on blood culture results  #Followup  cxr 2 view in 6 weeks   6 weeks with me or NP Tammy Parrett but return sooner if needed      Dr. Brand Males, M.D., [facility].C.P Pulmonary and Critical Care Medicine Staff Physician [place] [facility] Pager: [phone], If no answer or between  15:00h - 7:00h: call [phone]  08/31/2014 2:17 PM

## 2014-09-01 LAB — CULTURE, BLOOD (ROUTINE X 2): CULTURE: NO GROWTH

## 2014-09-10 NOTE — Telephone Encounter (Signed)
I saw him since and he was better and as of 09/10/2014 no updated in blood culture. SO will close note

## 2014-10-12 ENCOUNTER — Ambulatory Visit (INDEPENDENT_AMBULATORY_CARE_PROVIDER_SITE_OTHER)
Admission: RE | Admit: 2014-10-12 | Discharge: 2014-10-12 | Disposition: A | Discharge status: Home / Self Care | Payer: Medicare Other | Source: Ambulatory Visit | Attending: Adult Health | Admitting: Adult Health

## 2014-10-12 ENCOUNTER — Encounter: Payer: Self-pay | Admitting: Adult Health

## 2014-10-12 ENCOUNTER — Ambulatory Visit (INDEPENDENT_AMBULATORY_CARE_PROVIDER_SITE_OTHER): Payer: Medicare Other | Admitting: Adult Health

## 2014-10-12 VITALS — BP 120/62 | HR 76 | Temp 97.9°F | Ht 67.0 in | Wt 109.4 lb

## 2014-10-12 DIAGNOSIS — J189 Pneumonia, unspecified organism: Secondary | ICD-10-CM

## 2014-10-12 DIAGNOSIS — J449 Chronic obstructive pulmonary disease, unspecified: Secondary | ICD-10-CM

## 2014-10-12 DIAGNOSIS — J9611 Chronic respiratory failure with hypoxia: Secondary | ICD-10-CM

## 2014-10-12 NOTE — Patient Instructions (Addendum)
Chest xray today .  Continue on current regimen  May use Saline nasal rinses and gel as needed.  Claritin 10mg  At bedtime  As needed  Drainage  Follow up Dr. Chase Caller in 2-3 months and As needed

## 2014-10-12 NOTE — Progress Notes (Signed)
Subjective:    Patient ID: Frank Garcia, male    DOB: [birth year], 81 y.o.   MRN: [id number]  HPI    #FU Gold stage 3 copd - MM genotype  - feb 2009, fev1 1.13L/46% while on spiriva and advair with ex-desat, - June 2012:  Spirometry today (reviewed) - fev1 0.95L/38% and is a 110cc loss in fev1 in 3 years which approx 35cc/year - CAT score 16 Jan 2012 - Dec 2014: spiriva changed to tudorza due to urinary retention - Jan 2015: advair changed to [facility]  # pulmonary nodules, - RUL apical scar like density  - 2010 and 2011 -absent  - 2012 and 2013 - new and stable -03/24/13  - progression to 1.6cm    -  Onncimmne test reesult 04/29/13 is LOW level for 7 ung cancer proteins.  - Xpresys indi test done 04/29/13 is LIKELY BENGIN for nodule at 86% probability - 09/01/13 - stabl bilateral UL scarring. REC: annual low dose CT  # ex-smoker,   #Cachexia   - Body mass index is 18.17 kg/(m^2). on 11/13/2011 (weight 116# and up 5# since June 2012)  - - Body mass index is 16.94 kg/(m^2). on 06/09/2013 - Body mass index is 15.88 kg/(m^2). on 01/27/2014 - Body mass index is 16.06 kg/(m^2). on 05/07/2014    #Shingles - MArch 2013     #Recurrent COPD exacerbation - March 2013: Office treatment for COPD exacerbation - May 2013: Office treatment with doxycycline and prednisone for COPD exacerbation  - August 2030: Started roflumilast May 2-14 - office RX Possible COPDAE./ Rx doxy and pred Dec 2014 - office Rx Prdnisone and Augmentin. STopped roflumilast Mary 2015 - admit LLL PNA - Pneumoccoccal CAP  #hoarse voice - ENT evaluation and 2013 normal   OV 01/27/2014  Chief Complaint  Patient presents with  . Follow-up    Pt hospitalized in May for pna. Pt c/o cough with white and clear mucous, increase in SOB with little activity. Pt has not had much mobility since 5/27. C/o intermittent chest tightness.    Followup for the above. since I last saw him he was admitted 12/30/2013 with a left lower lobe  pneumococcal pneumonia that was confirmed by urine strep antigen. Since discharge he is feeling better. He presents with his daughter today for followup.the admission is left him with continued weight loss, cachexia and poor appetite despite drinking protein. However, for the last 3 days he has increased cough, dyspnea, increasing sputum volume and slight increase in color to thick white. He feels COPD exacerbation again. He and his daughter have questions on prognosis and overall expectation. He does not have home physical therapy. He is very interested in copd research trials HE continues his copd tudorza, breo and alb nebs and 3L o2 without fail  I think you are in copd flare up again HAve CXR 2 view to ensure pneumonia from MAy 2015 clearing up Take doxycycline 100mg  po twice daily x 5 days; take after meals and avoid sunlight Take prednisone 40 mg daily x 2 days, then 20mg  daily x 2 days, then 10mg  daily x 2 days, then 5mg  daily x 2 days and stop Continue o2 and other copd nebs as before\ We will arrange home PT for you to help with strength  #WEight loss  - likely due to copd but talk to pcp No PCP Per Patient    Followup  4 weeks or sooner if needed   OV 02/23/2014  Chief Complaint  Patient presents with  . Follow-up    Pt states with activity outdoors he becomes SOB. C/o increase in cough with intermittent yellow/orange mucous. Denies CP.     He has recovered o from recent COPD exacerbation. Improve dyspnea and improved chest tightness and improve wheezing. Improve fatigue.He is complaining of coust of BREO. Taking tudorzan and o2.  Although, he is having chronic sinus drainage that has returned along with significant cough with associated secretions. He is not doing his nasal steroids and netti pot any part anymore. >>no changes  04/10/2014 Follow up COPD  Returns for 6 week COPD follow up.  Reports breathing is doing well overall, does note some occasional prod cough with light  yellow mucus x2 weeks.  Does complain of thick mucus in am, once gets up then he feels much bette.r   Denies increased SOB, wheezing, tightness, f/c/s, n/v/d, hemoptysis.  no refills needed at this time. Remains on [place] .  CXR 01/2014 w/ chronic changes and no acute process.  Wt is better, up 3 lbs, eating some better  Discussed prevnar vaccine today.    OV 05/07/2014  Chief Complaint  Patient presents with  . Follow-up    Pt states when taking mucinex he has urinary retention. Pt states he has increase in SOB, PND, prod cough with orange and clear mucous x 1week. Pt denies CP/tightness.    Followup severe COPD and chronic respiratory failure with multiple recurrent exacerbations. \ \ Since last visit with me in June 2015 and falling with nurse practitioner in August 2015 overall he has been stable. He has been compliant with his oxygen and his triple combination inhaler therapy. Follow for the past few days he's had increased cough compared to baseline associated with change in weakness of sputum with some change in color of sputum nor change in dyspnea wheezing. Denies any chest pain fever chills or edema. He is frustrated about recurrent  COPD exacerbation. He does not want prednisone preferably if it can be avoided >>zpack   06/24/14 Acute OV  Complains of MR pt here for prod cough with light yellow mucus, increased SOB, some wheezing and tightness x2 days.  Taking mucinex without much help.  Denies f/c/s, n/v/d, hemoptysis., chest pain, orthopnea , edema.  Remains on [place].  >doxycyline rx  07/28/14  Acute OV  Pt complains of increased SOB, some prod cough with light tan mucus, PND, some sharp pain in the right lung w/ coughing x2 days. Denies f/c/s, n/v/d, hemoptysis, leg swelling.  Remains on [place].  On O2 3/m .  Had similar symptoms last visit 1 month ago was treated with doxycycline and a prednisone taper. Says that his symptoms totally  resolved and was improved up until 2 days ago. >>Levaquin rx   08/12/2014 Follow up PNA  Pt seen last visit for sick visit found to have new right apical PNA .  Tx w/ with Levaquin 750x 7  Returns today feeling much better . Says that he tolerated abx well, no n/v/d.  Cough, dyspnea and wheezing have improved  He denies any chest pain, orthopnea, PND or leg swelling Appetite is bett   OV 08/31/2014 Chief Complaint  Patient presents with  . Follow-up    Pt recently d/c from [facility] for pna. Pt stated his breathing has improved since d/c. Pt c/o mild cough with little mucus production-tan in color. Pt denies CP/tightness.     Followup severe COPD and chronic respiratroy failure with  oxygen dependence and severe protein calorie malnutrition and cachexia  - He was admitted 08/26/2014 through 08/27/14 with a left lower lobe pneumonia and discharged on Levaquin therapy. Last date is tomorrow. HE feels back to baseline. Blood culture is now growing BAcillus species but he denies any ocular, joint symptms. His main issue is low apppetite for several months despite ENSURE. No new issues.   10/12/2014 Follow up : COPD /Chronic Resp failiure On O2  Returns for 1 month . Breathing is doing well. Feels best in while.  Wants to know what he can take for PND, it bothers him with the oxygen cannula. Nose drips a lot. No fever, chest pain, discolored mucus , hemoptyiss or leg swelling.  Very excited his appetite is good, and gained up to 109lbs.  Flu , PVX and Prevnar utd.  Drives and goes to visit wife in [place] with dementia.        Review of Systems  Constitutional:   No  weight loss, night sweats,  Fevers, chills,  +fatigue, or  lassitude.  HEENT:   No headaches,  Difficulty swallowing,  Tooth/dental problems, or  Sore throat,                No sneezing, itching, ear ache, + nasal congestion, post nasal drip,   CV:  No chest pain,  Orthopnea, PND, swelling in lower extremities, anasarca,  dizziness, palpitations, syncope.   GI  No heartburn, indigestion, abdominal pain, nausea, vomiting, diarrhea, change in bowel habits, loss of appetite, bloody stools.   Resp:    No chest wall deformity  Skin: no rash or lesions.  GU: no dysuria, change in color of urine, no urgency or frequency.  No flank pain, no hematuria   MS:  No joint pain or swelling.  No decreased range of motion.  No back pain.  Psych:  No change in mood or affect. No depression or anxiety.  No memory loss.         Objective:   Physical Exam    Physical Exam GEN: A/Ox3; pleasant , NAD, elderly, frail, thin.   HEENT:  [place]/AT,  EACs-clear, TMs-wnl, NOSE-clear, THROAT-clear, no lesions, no postnasal drip or exudate noted.   NECK:  Supple w/ fair ROM; no JVD; normal carotid impulses w/o bruits; no thyromegaly or nodules palpated; no lymphadenopathy.  RESP  Diminished BS in bases few rhonchi , no accessory muscle use, no dullness to percussion  CARD:  RRR, no m/r/g  , no peripheral edema, pulses intact, no cyanosis or clubbing.  GI:   Soft & nt; nml bowel sounds; no organomegaly or masses detected.  Musco: Warm bil, no deformities or joint swelling noted.   Neuro: alert, no focal deficits noted.    Skin: Warm, no lesions or rashes       Assessment & Plan:

## 2014-10-12 NOTE — Assessment & Plan Note (Signed)
Clinically improved  follow up cxr today for clearance

## 2014-10-12 NOTE — Assessment & Plan Note (Signed)
Compensated on present regimen  No changes  

## 2014-10-12 NOTE — Assessment & Plan Note (Addendum)
Doing well on O2  Cont on current regimen

## 2014-12-07 ENCOUNTER — Telehealth: Payer: Self-pay | Admitting: Internal Medicine

## 2014-12-07 MED ORDER — TIOTROPIUM BROMIDE MONOHYDRATE 2.5 MCG/ACT IN AERS: 2.0000 | INHALATION_SPRAY | Freq: Every day | RESPIRATORY_TRACT | Status: DC

## 2014-12-07 NOTE — Telephone Encounter (Signed)
Patient says that he could not pick up his Frank Garcia because insurance company sent letter to confirm reason patient is taking medication.  Patient would like to know if there is anything he can take while he waits for approval of the [place].    MR - please advise.

## 2014-12-07 NOTE — Telephone Encounter (Signed)
Given samples of Spiriva Respimat.  Left at front desk, patient will need Respimat training when he comes to pick up medication.  Left a note on sample bag advising front desk that patient needs training and to contact triage once patient has arrived to pick up medication.  Patient has also been notified that he will need training on how to use respimat.    Hold in Triage until patient picks up medication and gets training.

## 2014-12-07 NOTE — Telephone Encounter (Signed)
smaples of tudorza or spiriva

## 2014-12-11 ENCOUNTER — Telehealth: Payer: Self-pay | Admitting: *Deleted

## 2014-12-11 MED ORDER — TIOTROPIUM BROMIDE MONOHYDRATE 2.5 MCG/ACT IN AERS: 2.0000 | INHALATION_SPRAY | Freq: Every day | RESPIRATORY_TRACT | Status: DC

## 2014-12-11 NOTE — Telephone Encounter (Signed)
Ask him to come and get some respimat samples and do PA for respimat please

## 2014-12-11 NOTE — Addendum Note (Signed)
Addended by: Len Blalock on: 12/11/2014 03:08 PM   Modules accepted: Orders

## 2014-12-11 NOTE — Telephone Encounter (Signed)
PA request received for Tudorza 400 mcg from [facility] PA submitted via covermymeds today. Pt approved for 1 year  Pharmacy notified Approval # [id number]

## 2014-12-11 NOTE — Telephone Encounter (Signed)
Spoke with pt, he is aware of recs.  Samples placed up front for him, and spiriva sent to pharmacy to initiate PA process.  Will hold in triage to look for PA.

## 2014-12-11 NOTE — Telephone Encounter (Signed)
Called and made patient aware of approval as he was never contacted about approval. Pt states that he tried the [place] while waiting for PA approval of Tudorza to be approved and the Spiriva works great. Pt states that he has not been short of breath at all while taking the Spiriva and would like this Rx sent to the pharmacy instead of Tudorza.   Please advise Dr Chase Caller that you are okay with sending this - PA may need to be done on this Rx as well.

## 2014-12-11 NOTE — Telephone Encounter (Signed)
Frank Garcia has been approved 11/1514-12/11/15 Plan ID #[id number] APPROVAL #[id number]

## 2015-01-01 ENCOUNTER — Ambulatory Visit (INDEPENDENT_AMBULATORY_CARE_PROVIDER_SITE_OTHER)
Admission: RE | Admit: 2015-01-01 | Discharge: 2015-01-01 | Disposition: A | Discharge status: Home / Self Care | Payer: Medicare Other | Source: Ambulatory Visit | Attending: Adult Health | Admitting: Adult Health

## 2015-01-01 ENCOUNTER — Ambulatory Visit (INDEPENDENT_AMBULATORY_CARE_PROVIDER_SITE_OTHER): Payer: Medicare Other | Admitting: Adult Health

## 2015-01-01 ENCOUNTER — Encounter: Payer: Self-pay | Admitting: Adult Health

## 2015-01-01 VITALS — BP 132/66 | HR 106 | Temp 98.1°F | Ht 67.0 in | Wt 103.2 lb

## 2015-01-01 DIAGNOSIS — J449 Chronic obstructive pulmonary disease, unspecified: Secondary | ICD-10-CM | POA: Diagnosis not present

## 2015-01-01 DIAGNOSIS — J441 Chronic obstructive pulmonary disease with (acute) exacerbation: Secondary | ICD-10-CM | POA: Diagnosis not present

## 2015-01-01 DIAGNOSIS — J189 Pneumonia, unspecified organism: Secondary | ICD-10-CM | POA: Diagnosis not present

## 2015-01-01 MED ORDER — PREDNISONE 10 MG PO TABS: ORAL_TABLET | ORAL | Status: DC

## 2015-01-01 MED ORDER — AMOXICILLIN-POT CLAVULANATE 875-125 MG PO TABS: 1.0000 | ORAL_TABLET | Freq: Two times a day (BID) | ORAL | Status: AC

## 2015-01-01 NOTE — Progress Notes (Signed)
Subjective:    Patient ID: Frank Garcia, male    DOB: 11/15/1928, 81 y.o.   MRN: [id number]  HPI    #FU Gold stage 3 copd - MM genotype  - feb 2009, fev1 1.13L/46% while on spiriva and advair with ex-desat, - June 2012:  Spirometry today (reviewed) - fev1 0.95L/38% and is a 110cc loss in fev1 in 3 years which approx 35cc/year - CAT score 16 Jan 2012 - Dec 2014: spiriva changed to tudorza due to urinary retention - Jan 2015: advair changed to [facility]  # pulmonary nodules, - RUL apical scar like density  - 2010 and 2011 -absent  - 2012 and 2013 - new and stable -03/24/13  - progression to 1.6cm    -  Onncimmne test reesult 04/29/13 is LOW level for 7 ung cancer proteins.  - Xpresys indi test done 04/29/13 is LIKELY BENGIN for nodule at 86% probability - 09/01/13 - stabl bilateral UL scarring. REC: annual low dose CT  # ex-smoker,   #Cachexia   - Body mass index is 18.17 kg/(m^2). on 11/13/2011 (weight 116# and up 5# since June 2012)  - - Body mass index is 16.94 kg/(m^2). on 06/09/2013 - Body mass index is 15.88 kg/(m^2). on 01/27/2014 - Body mass index is 16.06 kg/(m^2). on 05/07/2014    #Shingles - MArch 2013     #Recurrent COPD exacerbation - March 2013: Office treatment for COPD exacerbation - May 2013: Office treatment with doxycycline and prednisone for COPD exacerbation  - August 2030: Started roflumilast May 2-14 - office RX Possible COPDAE./ Rx doxy and pred Dec 2014 - office Rx Prdnisone and Augmentin. STopped roflumilast Mary 2015 - admit LLL PNA - Pneumoccoccal CAP  #hoarse voice - ENT evaluation and 2013 normal   OV 01/27/2014  Chief Complaint  Patient presents with  . Follow-up    Pt hospitalized in May for pna. Pt c/o cough with white and clear mucous, increase in SOB with little activity. Pt has not had much mobility since 5/27. C/o intermittent chest tightness.    Followup for the above. since I last saw him he was admitted 12/30/2013 with a left lower lobe  pneumococcal pneumonia that was confirmed by urine strep antigen. Since discharge he is feeling better. He presents with his daughter today for followup.the admission is left him with continued weight loss, cachexia and poor appetite despite drinking protein. However, for the last 3 days he has increased cough, dyspnea, increasing sputum volume and slight increase in color to thick white. He feels COPD exacerbation again. He and his daughter have questions on prognosis and overall expectation. He does not have home physical therapy. He is very interested in copd research trials HE continues his copd tudorza, breo and alb nebs and 3L o2 without fail  I think you are in copd flare up again HAve CXR 2 view to ensure pneumonia from MAy 2015 clearing up Take doxycycline 100mg  po twice daily x 5 days; take after meals and avoid sunlight Take prednisone 40 mg daily x 2 days, then 20mg  daily x 2 days, then 10mg  daily x 2 days, then 5mg  daily x 2 days and stop Continue o2 and other copd nebs as before\ We will arrange home PT for you to help with strength  #WEight loss  - likely due to copd but talk to pcp No PCP Per Patient    Followup  4 weeks or sooner if needed   OV 02/23/2014  Chief Complaint  Patient presents with  . Follow-up    Pt states with activity outdoors he becomes SOB. C/o increase in cough with intermittent yellow/orange mucous. Denies CP.     He has recovered o from recent COPD exacerbation. Improve dyspnea and improved chest tightness and improve wheezing. Improve fatigue.He is complaining of coust of BREO. Taking tudorzan and o2.  Although, he is having chronic sinus drainage that has returned along with significant cough with associated secretions. He is not doing his nasal steroids and netti pot any part anymore. >>no changes  04/10/2014 Follow up COPD  Returns for 6 week COPD follow up.  Reports breathing is doing well overall, does note some occasional prod cough with light  yellow mucus x2 weeks.  Does complain of thick mucus in am, once gets up then he feels much bette.r   Denies increased SOB, wheezing, tightness, f/c/s, n/v/d, hemoptysis.  no refills needed at this time. Remains on [place] .  CXR 01/2014 w/ chronic changes and no acute process.  Wt is better, up 3 lbs, eating some better  Discussed prevnar vaccine today.    OV 05/07/2014  Chief Complaint  Patient presents with  . Follow-up    Pt states when taking mucinex he has urinary retention. Pt states he has increase in SOB, PND, prod cough with orange and clear mucous x 1week. Pt denies CP/tightness.    Followup severe COPD and chronic respiratory failure with multiple recurrent exacerbations. \ \ Since last visit with me in June 2015 and falling with nurse practitioner in August 2015 overall he has been stable. He has been compliant with his oxygen and his triple combination inhaler therapy. Follow for the past few days he's had increased cough compared to baseline associated with change in weakness of sputum with some change in color of sputum nor change in dyspnea wheezing. Denies any chest pain fever chills or edema. He is frustrated about recurrent  COPD exacerbation. He does not want prednisone preferably if it can be avoided >>zpack   06/24/14 Acute OV  Complains of MR pt here for prod cough with light yellow mucus, increased SOB, some wheezing and tightness x2 days.  Taking mucinex without much help.  Denies f/c/s, n/v/d, hemoptysis., chest pain, orthopnea , edema.  Remains on [place].  >doxycyline rx  07/28/14  Acute OV  Pt complains of increased SOB, some prod cough with light tan mucus, PND, some sharp pain in the right lung w/ coughing x2 days. Denies f/c/s, n/v/d, hemoptysis, leg swelling.  Remains on [place].  On O2 3/m .  Had similar symptoms last visit 1 month ago was treated with doxycycline and a prednisone taper. Says that his symptoms totally  resolved and was improved up until 2 days ago. >>Levaquin rx   08/12/2014 Follow up PNA  Pt seen last visit for sick visit found to have new right apical PNA .  Tx w/ with Levaquin 750x 7  Returns today feeling much better . Says that he tolerated abx well, no n/v/d.  Cough, dyspnea and wheezing have improved  He denies any chest pain, orthopnea, PND or leg swelling Appetite is bett   OV 08/31/2014 Chief Complaint  Patient presents with  . Follow-up    Pt recently d/c from [facility] for pna. Pt stated his breathing has improved since d/c. Pt c/o mild cough with little mucus production-tan in color. Pt denies CP/tightness.     Followup severe COPD and chronic respiratroy failure with  oxygen dependence and severe protein calorie malnutrition and cachexia  - He was admitted 08/26/2014 through 08/27/14 with a left lower lobe pneumonia and discharged on Levaquin therapy. Last date is tomorrow. HE feels back to baseline. Blood culture is now growing BAcillus species but he denies any ocular, joint symptms. His main issue is low apppetite for several months despite ENSURE. No new issues.   10/12/2014 Follow up : COPD /Chronic Resp failiure On O2  Returns for 1 month . Breathing is doing well. Feels best in while.  Wants to know what he can take for PND, it bothers him with the oxygen cannula. Nose drips a lot. No fever, chest pain, discolored mucus , hemoptyiss or leg swelling.  Very excited his appetite is good, and gained up to 109lbs.  Flu , PVX and Prevnar utd.  Drives and goes to visit wife in [place] with dementia.  >>no changes   01/01/2015 Acute OV  Presents for an acute office visit.  Complains of wheezing, prod cough (clear-light yellow phlem), PND, nasal congestion for 1 week. Complains of tightness and more dyspnea. Pain in ribs with cough and deep breaths. Occasional wheezing .  No fever, hemotpysis, exertional chest pain , orthopena or edema.  Appetite is fair with no n/v/d.   Remains  on O2 3l/m .       Review of Systems  Constitutional:   No  weight loss, night sweats,  Fevers, chills,  +fatigue, or  lassitude.  HEENT:   No headaches,  Difficulty swallowing,  Tooth/dental problems, or  Sore throat,                No sneezing, itching, ear ache, + nasal congestion, post nasal drip,   CV:  No chest pain,  Orthopnea, PND, swelling in lower extremities, anasarca, dizziness, palpitations, syncope.   GI  No heartburn, indigestion, abdominal pain, nausea, vomiting, diarrhea, change in bowel habits, loss of appetite, bloody stools.   Resp:    No chest wall deformity  Skin: no rash or lesions.  GU: no dysuria, change in color of urine, no urgency or frequency.  No flank pain, no hematuria   MS:  No joint pain or swelling.  No decreased range of motion.  No back pain.  Psych:  No change in mood or affect. No depression or anxiety.  No memory loss.         Objective:   Physical Exam    Physical Exam GEN: A/Ox3; pleasant , NAD, elderly, frail, thin.   HEENT:  [place]/AT,  EACs-clear, TMs-wnl, NOSE-clear, THROAT-clear, no lesions, no postnasal drip or exudate noted.   NECK:  Supple w/ fair ROM; no JVD; normal carotid impulses w/o bruits; no thyromegaly or nodules palpated; no lymphadenopathy.  RESP  Diminished BS in bases few rhonchi , no accessory muscle use, no dullness to percussion  CARD:  RRR, no m/r/g  , no peripheral edema, pulses intact, no cyanosis or clubbing.  GI:   Soft & nt; nml bowel sounds; no organomegaly or masses detected.  Musco: Warm bil, no deformities or joint swelling noted.   Neuro: alert, no focal deficits noted.    Skin: Warm, no lesions or rashes       Assessment & Plan:

## 2015-01-01 NOTE — Patient Instructions (Addendum)
Augmentin '875mg'$  Twice daily  For 7 days -take with food.  Mucinex DM Twice daily  As needed  Cough/congestion  Prednisone taper over next week.  Chest xray today. Follow up Dr. Chase Caller as planned and As needed   Please contact office for sooner follow up if symptoms do not improve or worsen or seek emergency care

## 2015-01-01 NOTE — Progress Notes (Signed)
Quick Note:  LMOM TCB x2 - once on each number listed for pt. He is already on abx therapy per ov today. ______

## 2015-01-07 NOTE — Assessment & Plan Note (Signed)
Flare   Plan  Augmentin '875mg'$  Twice daily  For 7 days -take with food.  Mucinex DM Twice daily  As needed  Cough/congestion  Prednisone taper over next week.  Chest xray today. Follow up Dr. Chase Caller as planned and As needed   Please contact office for sooner follow up if symptoms do not improve or worsen or seek emergency care

## 2015-01-07 NOTE — Assessment & Plan Note (Signed)
Late add  Right sided PNA  Cont on augmentin x 7  Ov in 1-2 weeks with cxr  Please contact office for sooner follow up if symptoms do not improve or worsen or seek emergency care

## 2015-01-11 ENCOUNTER — Ambulatory Visit (INDEPENDENT_AMBULATORY_CARE_PROVIDER_SITE_OTHER): Payer: Medicare Other | Admitting: Internal Medicine

## 2015-01-11 ENCOUNTER — Encounter: Payer: Self-pay | Admitting: Internal Medicine

## 2015-01-11 VITALS — BP 108/56 | HR 80 | Ht 67.0 in | Wt 103.0 lb

## 2015-01-11 DIAGNOSIS — R0982 Postnasal drip: Secondary | ICD-10-CM | POA: Insufficient documentation

## 2015-01-11 DIAGNOSIS — J189 Pneumonia, unspecified organism: Secondary | ICD-10-CM | POA: Diagnosis not present

## 2015-01-11 DIAGNOSIS — J449 Chronic obstructive pulmonary disease, unspecified: Secondary | ICD-10-CM

## 2015-01-11 DIAGNOSIS — E43 Unspecified severe protein-calorie malnutrition: Secondary | ICD-10-CM | POA: Diagnosis not present

## 2015-01-11 MED ORDER — FLUTICASONE PROPIONATE 50 MCG/ACT NA SUSP: 2.0000 | Freq: Every day | NASAL | Status: AC

## 2015-01-11 NOTE — Patient Instructions (Addendum)
ICD-9-CM ICD-10-CM   1. COPD, very severe 496 J44.9   2. Protein-calorie malnutrition, severe 262 E43   3. Pneumonia involving right lung, unspecified part of lung 486 J18.9   4. Postnasal drip 784.91 R09.82    - COPD: Is stable. Continue your inhalers and oxygen - Cachexia: Continue protein supplementation. Glad weight is improving - Pneumonia early May 2016: Glad you're better. Repeat chest x-ray and 3 months - Postnasal drip:  START generic fluticasone inhaler 2 squirts each nostril daily  Follow-up  - 3 months or sooner if needed - Chest x-ray time of follow-up

## 2015-01-11 NOTE — Progress Notes (Signed)
Subjective:    Patient ID: Frank Garcia, male    DOB: 11-24-1928, 81 y.o.   MRN: [id number]  HPI      #FU Gold stage 3 copd - MM genotype  - feb 2009, fev1 1.13L/46% while on spiriva and advair with ex-desat, - June 2012:  Spirometry today (reviewed) - fev1 0.95L/38% and is a 110cc loss in fev1 in 3 years which approx 35cc/year - CAT score 16 Jan 2012 - Dec 2014: spiriva changed to tudorza due to urinary retention - Jan 2015: advair changed to [facility]  # pulmonary nodules, - RUL apical scar like density  - 2010 and 2011 -absent  - 2012 and 2013 - new and stable -03/24/13  - progression to 1.6cm    -  Onncimmne test reesult 04/29/13 is LOW level for 7 ung cancer proteins.  - Xpresys indi test done 04/29/13 is LIKELY BENGIN for nodule at 86% probability - 09/01/13 - stabl bilateral UL scarring. REC: annual low dose CT - February 2016: Chest x-ray clear - May 2016 chest x-ray: Right pulmonary infiltrate perihilar   # ex-smoker,   #Cachexia   - Body mass index is 18.17 kg/(m^2). on 11/13/2011 (weight 116# and up 5# since June 2012)  - - Body mass index is 16.94 kg/(m^2). on 06/09/2013 - Body mass index is 15.88 kg/(m^2). on 01/27/2014 - Body mass index is 16.06 kg/(m^2). on 05/07/2014 - Body mass index is 16.13 kg/(m^2). 01/11/2015    #Shingles - MArch 2013     #Recurrent COPD exacerbation - March 2013: Office treatment for COPD exacerbation - May 2013: Office treatment with doxycycline and prednisone for COPD exacerbation  - August 2030: Started roflumilast May 2-14 - office RX Possible COPDAE./ Rx doxy and pred Dec 2014 - office Rx Prdnisone and Augmentin. STopped roflumilast Mary 2015 - admit LLL PNA - Pneumoccoccal CAP  #hoarse voice - ENT evaluation and 2013 normal  OV 01/11/2015  Chief Complaint  Patient presents with  . Follow-up    Pt here after acute visit with TP. Pt stated he is feeling better. Pt c/o prod cough with yellow mucus--much improved since last OV. Pt  denies CP/tightness.    Follow-up for all of the above issues  - COPD: This is severe chronic respiratory failure. Gold stage IV. He saw my nurse practitioner early May 2016 chest x-ray showed pneumonia right perihilar infiltrate [personally review this film] was treated with Augmentin and prednisone. Currently feels back to baseline and he feels great. He feels Spiriva Respimat significantly helped his symptoms. He is compliant with this oxygen and Dulera  - Pneumonia early May 2016: This is currently clinically resolved. He will need a chest x-ray at follow-up  - Cachexia: His weight is finally turning the corner. He is taking protein supplementation. He is happy about this.  - New issue: Intensive persistent mild chronic postnasal drip that causes nocturnal cough. He wants to get rid of this. This no associated purulent drainage. Severity is only mild. No clear cut aggravating or relieving factors but is worse at night. His been there for a few months insidious onset.   Current outpatient prescriptions:  .  albuterol (PROVENTIL HFA;VENTOLIN HFA) 108 (90 BASE) MCG/ACT inhaler, Inhale 2 puffs into the lungs every 4 (four) hours as needed for wheezing or shortness of breath., Disp: 1 Inhaler, Rfl: 3 .  albuterol (PROVENTIL) (2.5 MG/3ML) 0.083% nebulizer solution, Take 3 mLs (2.5 mg total) by nebulization every 2 (two) hours as needed for  wheezing or shortness of breath., Disp: 540 mL, Rfl: 4 .  mometasone-formoterol (DULERA) 200-5 MCG/ACT AERO, Inhale 2 puffs into the lungs 2 (two) times daily., Disp: 1 Inhaler, Rfl: 2 .  Nutritional Supplements (ENSURE HIGH PROTEIN PO), Take 1 Can by mouth daily. , Disp: , Rfl:  .  RAPAFLO 8 MG CAPS capsule, Take 1 capsule by mouth daily., Disp: , Rfl:  .  Tiotropium Bromide Monohydrate (SPIRIVA RESPIMAT) 2.5 MCG/ACT AERS, Inhale 2 puffs into the lungs daily., Disp: 1 Inhaler, Rfl: 6   Review of Systems  Constitutional: Negative for fever and unexpected  weight change.  HENT: Negative for congestion, dental problem, ear pain, nosebleeds, postnasal drip, rhinorrhea, sinus pressure, sneezing, sore throat and trouble swallowing.   Eyes: Negative for redness and itching.  Respiratory: Positive for cough and shortness of breath. Negative for chest tightness and wheezing.   Cardiovascular: Negative for palpitations and leg swelling.  Gastrointestinal: Negative for nausea and vomiting.  Genitourinary: Negative for dysuria.  Musculoskeletal: Negative for joint swelling.  Skin: Negative for rash.  Neurological: Negative for headaches.  Hematological: Does not bruise/bleed easily.  Psychiatric/Behavioral: Negative for dysphoric mood. The patient is not nervous/anxious.        Objective:   Physical Exam  Constitutional: He is oriented to person, place, and time. He appears well-developed and well-nourished. No distress.  Body mass index is 16.13 kg/(m^2).   HENT:  Head: Normocephalic and atraumatic.  Right Ear: External ear normal.  Left Ear: External ear normal.  Mouth/Throat: Oropharynx is clear and moist. No oropharyngeal exudate.  o2 on  Mild post nasal drip +  Eyes: Conjunctivae and EOM are normal. Pupils are equal, round, and reactive to light. Right eye exhibits no discharge. Left eye exhibits no discharge. No scleral icterus.  Neck: Normal range of motion. Neck supple. No JVD present. No tracheal deviation present. No thyromegaly present.  Cardiovascular: Normal rate, regular rhythm and intact distal pulses.  Exam reveals no gallop and no friction rub.   No murmur heard. Pulmonary/Chest: Effort normal and breath sounds normal. No respiratory distress. He has no wheezes. He has no rales. He exhibits no tenderness.  Cachexia barrell chest Purse lip berathing +  Abdominal: Soft. Bowel sounds are normal. He exhibits no distension and no mass. There is no tenderness. There is no rebound and no guarding.  Musculoskeletal: Normal range of  motion. He exhibits no edema or tenderness.  Lymphadenopathy:    He has no cervical adenopathy.  Neurological: He is alert and oriented to person, place, and time. He has normal reflexes. No cranial nerve deficit. Coordination normal.  Skin: Skin is warm and dry. No rash noted. He is not diaphoretic. No erythema. No pallor.  Dry skin with flakes  Psychiatric: He has a normal mood and affect. His behavior is normal. Judgment and thought content normal.  Nursing note and vitals reviewed.   Filed Vitals:   01/11/15 1455  BP: 108/56  Pulse: 80  Height: '5\' 7"'$  (1.702 m)  Weight: 103 lb (46.72 kg)  SpO2: 98%         Assessment & Plan:     ICD-9-CM ICD-10-CM   1. COPD, very severe 496 J44.9   2. Protein-calorie malnutrition, severe 262 E43   3. Pneumonia involving right lung, unspecified part of lung 486 J18.9   4. Postnasal drip 784.91 R09.82     - COPD: Is stable. Continue your inhalers and oxygen - Cachexia: Continue protein supplementation. Glad weight is improving -  Pneumonia early May 2016: Glad you're better. Repeat chest x-ray and 3 months - Postnasal drip:  START generic fluticasone inhaler 2 squirts each nostril daily  Follow-up  - 3 months or sooner if needed - Chest x-ray time of follow-up   Dr. Brand Males, M.D., [facility].C.P Pulmonary and Critical Care Medicine Staff Physician [place] [facility] Pager: [phone], If no answer or between  15:00h - 7:00h: call [phone]  01/11/2015 3:34 PM

## 2015-01-22 ENCOUNTER — Telehealth: Payer: Self-pay | Admitting: Adult Health

## 2015-01-22 MED ORDER — AMOXICILLIN-POT CLAVULANATE 875-125 MG PO TABS: 1.0000 | ORAL_TABLET | Freq: Two times a day (BID) | ORAL | Status: DC

## 2015-01-22 NOTE — Telephone Encounter (Signed)
No Known Allergies    Pt states he started feeling bad yesterday with congestion and coughing up thick green mucus. Please advise.

## 2015-01-22 NOTE — Telephone Encounter (Signed)
Spoke with pt.  Discussed below recs per Dr. Melvyn Novas.  He verbalized understanding, is in agreement with plan, and aware rx sent to [facility].  Pt to call office back if symptoms do not improve or worsen and is to seek emergency care if needed.

## 2015-01-22 NOTE — Telephone Encounter (Signed)
Augmentin 875 mg take one pill twice daily  X 10 days - take at breakfast and supper with large glass of water.  It would help reduce the usual side effects (diarrhea and yeast infections) if you ate cultured yogurt at lunch.  

## 2015-01-29 ENCOUNTER — Ambulatory Visit (INDEPENDENT_AMBULATORY_CARE_PROVIDER_SITE_OTHER)
Admission: RE | Admit: 2015-01-29 | Discharge: 2015-01-29 | Disposition: A | Discharge status: Home / Self Care | Payer: Medicare Other | Source: Ambulatory Visit | Attending: Adult Health | Admitting: Adult Health

## 2015-01-29 ENCOUNTER — Ambulatory Visit (INDEPENDENT_AMBULATORY_CARE_PROVIDER_SITE_OTHER): Payer: Medicare Other | Admitting: Adult Health

## 2015-01-29 ENCOUNTER — Telehealth: Payer: Self-pay | Admitting: Pulmonary Disease

## 2015-01-29 ENCOUNTER — Encounter: Payer: Self-pay | Admitting: Adult Health

## 2015-01-29 VITALS — BP 122/64 | HR 92 | Temp 98.6°F | Ht 67.5 in | Wt 102.0 lb

## 2015-01-29 DIAGNOSIS — J189 Pneumonia, unspecified organism: Secondary | ICD-10-CM

## 2015-01-29 DIAGNOSIS — J441 Chronic obstructive pulmonary disease with (acute) exacerbation: Secondary | ICD-10-CM | POA: Diagnosis not present

## 2015-01-29 MED ORDER — PREDNISONE 10 MG PO TABS: ORAL_TABLET | ORAL | Status: DC

## 2015-01-29 MED ORDER — HYDROCODONE-HOMATROPINE 5-1.5 MG/5ML PO SYRP: 2.5000 mL | ORAL_SOLUTION | Freq: Every evening | ORAL | Status: DC | PRN

## 2015-01-29 NOTE — Addendum Note (Signed)
Addended by: Parke Poisson E on: 01/29/2015 05:13 PM   Modules accepted: Orders

## 2015-01-29 NOTE — Assessment & Plan Note (Signed)
Finish Augmentin  Mucinex DM Twice daily  As needed  Cough/congestion  Prednisone taper over next week.  Chest xray today. Hydromet 1/2 tsp At bedtime  As needed  Cough may make you sleepy Follow up Dr. Chase Caller in 6 weeks and .rpn  Please contact office for sooner follow up if symptoms do not improve or worsen or seek emergency care

## 2015-01-29 NOTE — Progress Notes (Signed)
Subjective:    Patient ID: Frank Garcia, male    DOB: 11/15/1928, 81 y.o.   MRN: [id number]  HPI    #FU Gold stage 3 copd - MM genotype  - feb 2009, fev1 1.13L/46% while on spiriva and advair with ex-desat, - June 2012:  Spirometry today (reviewed) - fev1 0.95L/38% and is a 110cc loss in fev1 in 3 years which approx 35cc/year - CAT score 16 Jan 2012 - Dec 2014: spiriva changed to tudorza due to urinary retention - Jan 2015: advair changed to [facility]  # pulmonary nodules, - RUL apical scar like density  - 2010 and 2011 -absent  - 2012 and 2013 - new and stable -03/24/13  - progression to 1.6cm    -  Onncimmne test reesult 04/29/13 is LOW level for 7 ung cancer proteins.  - Xpresys indi test done 04/29/13 is LIKELY BENGIN for nodule at 86% probability - 09/01/13 - stabl bilateral UL scarring. REC: annual low dose CT  # ex-smoker,   #Cachexia   - Body mass index is 18.17 kg/(m^2). on 11/13/2011 (weight 116# and up 5# since June 2012)  - - Body mass index is 16.94 kg/(m^2). on 06/09/2013 - Body mass index is 15.88 kg/(m^2). on 01/27/2014 - Body mass index is 16.06 kg/(m^2). on 05/07/2014    #Shingles - MArch 2013     #Recurrent COPD exacerbation - March 2013: Office treatment for COPD exacerbation - May 2013: Office treatment with doxycycline and prednisone for COPD exacerbation  - August 2030: Started roflumilast May 2-14 - office RX Possible COPDAE./ Rx doxy and pred Dec 2014 - office Rx Prdnisone and Augmentin. STopped roflumilast Mary 2015 - admit LLL PNA - Pneumoccoccal CAP  #hoarse voice - ENT evaluation and 2013 normal   OV 01/27/2014  Chief Complaint  Patient presents with  . Follow-up    Pt hospitalized in May for pna. Pt c/o cough with white and clear mucous, increase in SOB with little activity. Pt has not had much mobility since 5/27. C/o intermittent chest tightness.    Followup for the above. since I last saw him he was admitted 12/30/2013 with a left lower lobe  pneumococcal pneumonia that was confirmed by urine strep antigen. Since discharge he is feeling better. He presents with his daughter today for followup.the admission is left him with continued weight loss, cachexia and poor appetite despite drinking protein. However, for the last 3 days he has increased cough, dyspnea, increasing sputum volume and slight increase in color to thick white. He feels COPD exacerbation again. He and his daughter have questions on prognosis and overall expectation. He does not have home physical therapy. He is very interested in copd research trials HE continues his copd tudorza, breo and alb nebs and 3L o2 without fail  I think you are in copd flare up again HAve CXR 2 view to ensure pneumonia from MAy 2015 clearing up Take doxycycline 100mg  po twice daily x 5 days; take after meals and avoid sunlight Take prednisone 40 mg daily x 2 days, then 20mg  daily x 2 days, then 10mg  daily x 2 days, then 5mg  daily x 2 days and stop Continue o2 and other copd nebs as before\ We will arrange home PT for you to help with strength  #WEight loss  - likely due to copd but talk to pcp No PCP Per Patient    Followup  4 weeks or sooner if needed   OV 02/23/2014  Chief Complaint  Patient presents with  . Follow-up    Pt states with activity outdoors he becomes SOB. C/o increase in cough with intermittent yellow/orange mucous. Denies CP.     He has recovered o from recent COPD exacerbation. Improve dyspnea and improved chest tightness and improve wheezing. Improve fatigue.He is complaining of coust of BREO. Taking tudorzan and o2.  Although, he is having chronic sinus drainage that has returned along with significant cough with associated secretions. He is not doing his nasal steroids and netti pot any part anymore. >>no changes  04/10/2014 Follow up COPD  Returns for 6 week COPD follow up.  Reports breathing is doing well overall, does note some occasional prod cough with light  yellow mucus x2 weeks.  Does complain of thick mucus in am, once gets up then he feels much bette.r   Denies increased SOB, wheezing, tightness, f/c/s, n/v/d, hemoptysis.  no refills needed at this time. Remains on [place] .  CXR 01/2014 w/ chronic changes and no acute process.  Wt is better, up 3 lbs, eating some better  Discussed prevnar vaccine today.    OV 05/07/2014  Chief Complaint  Patient presents with  . Follow-up    Pt states when taking mucinex he has urinary retention. Pt states he has increase in SOB, PND, prod cough with orange and clear mucous x 1week. Pt denies CP/tightness.    Followup severe COPD and chronic respiratory failure with multiple recurrent exacerbations. \ \ Since last visit with me in June 2015 and falling with nurse practitioner in August 2015 overall he has been stable. He has been compliant with his oxygen and his triple combination inhaler therapy. Follow for the past few days he's had increased cough compared to baseline associated with change in weakness of sputum with some change in color of sputum nor change in dyspnea wheezing. Denies any chest pain fever chills or edema. He is frustrated about recurrent  COPD exacerbation. He does not want prednisone preferably if it can be avoided >>zpack   06/24/14 Acute OV  Complains of MR pt here for prod cough with light yellow mucus, increased SOB, some wheezing and tightness x2 days.  Taking mucinex without much help.  Denies f/c/s, n/v/d, hemoptysis., chest pain, orthopnea , edema.  Remains on [place].  >doxycyline rx  07/28/14  Acute OV  Pt complains of increased SOB, some prod cough with light tan mucus, PND, some sharp pain in the right lung w/ coughing x2 days. Denies f/c/s, n/v/d, hemoptysis, leg swelling.  Remains on [place].  On O2 3/m .  Had similar symptoms last visit 1 month ago was treated with doxycycline and a prednisone taper. Says that his symptoms totally  resolved and was improved up until 2 days ago. >>Levaquin rx   08/12/2014 Follow up PNA  Pt seen last visit for sick visit found to have new right apical PNA .  Tx w/ with Levaquin 750x 7  Returns today feeling much better . Says that he tolerated abx well, no n/v/d.  Cough, dyspnea and wheezing have improved  He denies any chest pain, orthopnea, PND or leg swelling Appetite is bett   OV 08/31/2014 Chief Complaint  Patient presents with  . Follow-up    Pt recently d/c from [facility] for pna. Pt stated his breathing has improved since d/c. Pt c/o mild cough with little mucus production-tan in color. Pt denies CP/tightness.     Followup severe COPD and chronic respiratroy failure with  oxygen dependence and severe protein calorie malnutrition and cachexia  - He was admitted 08/26/2014 through 08/27/14 with a left lower lobe pneumonia and discharged on Levaquin therapy. Last date is tomorrow. HE feels back to baseline. Blood culture is now growing BAcillus species but he denies any ocular, joint symptms. His main issue is low apppetite for several months despite ENSURE. No new issues.   10/12/2014 Follow up : COPD /Chronic Resp failiure On O2  Returns for 1 month . Breathing is doing well. Feels best in while.  Wants to know what he can take for PND, it bothers him with the oxygen cannula. Nose drips a lot. No fever, chest pain, discolored mucus , hemoptyiss or leg swelling.  Very excited his appetite is good, and gained up to 109lbs.  Flu , PVX and Prevnar utd.  Drives and goes to visit wife in [place] with dementia.  >>no changes   01/01/2015 Acute OV  Presents for an acute office visit.  Complains of wheezing, prod cough (clear-light yellow phlem), PND, nasal congestion for 1 week. Complains of tightness and more dyspnea. Pain in ribs with cough and deep breaths. Occasional wheezing .  No fever, hemotpysis, exertional chest pain , orthopena or edema.  Appetite is fair with no n/v/d.  Remains on  O2 3l/m .  >CXR with right sided PNA , rx Augmentin   01/29/2015 Acute OV  Returns with persistent cough and congestion  Tx for bronchitis and right sided PNA 5/6 with Augmentin  Felt some better.  Seen 2 weeks ago with Dr. Chase Caller , improved from flare  Symptoms started to return last week With cough and congestion .  Augmentin called in for 10 days on 5/27.  He is feeling better but still winded easily and cough is keeping him up at night.  Seen 2 weeks ago with Dr. Chase Caller , improved from flare  On Dulera and Spiriva .  Denies hemoptysis , chest pain, orthopnea, edema or fever.  Appetite is low.       Review of Systems  Constitutional:   No  weight loss, night sweats,  Fevers, chills,  +fatigue, or  lassitude.  HEENT:   No headaches,  Difficulty swallowing,  Tooth/dental problems, or  Sore throat,                No sneezing, itching, ear ache, + nasal congestion, post nasal drip,   CV:  No chest pain,  Orthopnea, PND, swelling in lower extremities, anasarca, dizziness, palpitations, syncope.   GI  No heartburn, indigestion, abdominal pain, nausea, vomiting, diarrhea, change in bowel habits, loss of appetite, bloody stools.   Resp:    No chest wall deformity  Skin: no rash or lesions.  GU: no dysuria, change in color of urine, no urgency or frequency.  No flank pain, no hematuria   MS:  No joint pain or swelling.  No decreased range of motion.  No back pain.  Psych:  No change in mood or affect. No depression or anxiety.  No memory loss.         Objective:   Physical Exam    Physical Exam GEN: A/Ox3; pleasant , NAD, elderly, frail, thin.   HEENT:  [place]/AT,  EACs-clear, TMs-wnl, NOSE-clear, THROAT-clear, no lesions, no postnasal drip or exudate noted.   NECK:  Supple w/ fair ROM; no JVD; normal carotid impulses w/o bruits; no thyromegaly or nodules palpated; no lymphadenopathy.  RESP  Diminished BS in bases few rhonchi , no accessory  muscle use, no dullness  to percussion  CARD:  RRR, no m/r/g  , no peripheral edema, pulses intact, no cyanosis or clubbing.  GI:   Soft & nt; nml bowel sounds; no organomegaly or masses detected.  Musco: Warm bil, no deformities or joint swelling noted.   Neuro: alert, no focal deficits noted.    Skin: Warm, no lesions or rashes       Assessment & Plan:

## 2015-01-29 NOTE — Telephone Encounter (Signed)
CXR report called in by radiology - Persistent right perihilar density is grossly unchanged from the prior study, with extension along the right middle lobe. Underlying mass cannot be excluded. CT of the chest would be helpful for further evaluation.

## 2015-01-29 NOTE — Patient Instructions (Addendum)
Finish Augmentin  Mucinex DM Twice daily  As needed  Cough/congestion  Prednisone taper over next week.  Chest xray today. Hydromet 1/2 tsp At bedtime  As needed  Cough may make you sleepy Follow up Dr. Chase Caller in 6 weeks and .rpn  Please contact office for sooner follow up if symptoms do not improve or worsen or seek emergency care

## 2015-01-29 NOTE — Assessment & Plan Note (Signed)
Slow to resolve flare   Plan  Finish Augmentin  Mucinex DM Twice daily  As needed  Cough/congestion  Prednisone taper over next week.  Chest xray today. Hydromet 1/2 tsp At bedtime  As needed  Cough may make you sleepy Follow up Dr. Chase Caller in 6 weeks and .rpn  Please contact office for sooner follow up if symptoms do not improve or worsen or seek emergency care

## 2015-01-30 NOTE — Telephone Encounter (Signed)
Frank Garcia  Please order CT chest wo contrast - Right middle lobe mass and RUL nodule

## 2015-02-03 ENCOUNTER — Other Ambulatory Visit: Payer: Self-pay | Admitting: Adult Health

## 2015-02-03 DIAGNOSIS — R911 Solitary pulmonary nodule: Secondary | ICD-10-CM

## 2015-02-03 DIAGNOSIS — J189 Pneumonia, unspecified organism: Secondary | ICD-10-CM

## 2015-02-03 NOTE — Telephone Encounter (Signed)
CT order placed.  Pt aware

## 2015-02-04 ENCOUNTER — Ambulatory Visit (INDEPENDENT_AMBULATORY_CARE_PROVIDER_SITE_OTHER)
Admission: RE | Admit: 2015-02-04 | Discharge: 2015-02-04 | Disposition: A | Discharge status: Home / Self Care | Payer: Medicare Other | Source: Ambulatory Visit | Attending: Adult Health | Admitting: Adult Health

## 2015-02-04 ENCOUNTER — Telehealth: Payer: Self-pay | Admitting: Adult Health

## 2015-02-04 DIAGNOSIS — J189 Pneumonia, unspecified organism: Secondary | ICD-10-CM

## 2015-02-04 DIAGNOSIS — R918 Other nonspecific abnormal finding of lung field: Secondary | ICD-10-CM

## 2015-02-04 DIAGNOSIS — R911 Solitary pulmonary nodule: Secondary | ICD-10-CM | POA: Diagnosis not present

## 2015-02-04 NOTE — Telephone Encounter (Signed)
Call report received from [facility] Radiology CT Chest done today  IMPRESSION: 1. The area of concern on the recent chest x-rays corresponds to right middle lobe atelectasis and scarring. This is associated with abrupt termination of the right middle lobe bronchus best visualized on image 39 of series 3. Correlation with bronchoscopy is suggested to exclude the possibility of an obstructing endobronchial lesion. 2. In addition, in the medial aspect of the right upper lobe there is a new 8 x 9 x 15 mm nodule, concerning for potential neoplasm. This is immediately medial to a right upper lobe bronchus, and could be targeted for bronchoscopic guided biopsy if clinically appropriate. Alternatively, PET-CT should be considered. 3. Diffuse bronchial wall thickening with severe centrilobular emphysema. 4. Atherosclerosis, including left main and 2 vessel coronary artery disease. 5. Additional incidental findings, as above. These results will be called to the ordering clinician or representative by the Radiologist Assistant, and communication documented in the PACS or zVision Dashboard.  Will forward to TP

## 2015-02-08 NOTE — Telephone Encounter (Signed)
Please set up with PET scan  Make sure has follow up after PET

## 2015-02-09 NOTE — Telephone Encounter (Signed)
Order placed for PET Will sign off

## 2015-02-15 ENCOUNTER — Encounter (HOSPITAL_COMMUNITY)
Admission: RE | Admit: 2015-02-15 | Discharge: 2015-02-15 | Disposition: A | Discharge status: Home / Self Care | Payer: Medicare Other | Source: Ambulatory Visit | Attending: Adult Health | Admitting: Adult Health

## 2015-02-15 DIAGNOSIS — R918 Other nonspecific abnormal finding of lung field: Secondary | ICD-10-CM | POA: Diagnosis present

## 2015-02-15 LAB — GLUCOSE, CAPILLARY: GLUCOSE-CAPILLARY: 79 mg/dL (ref 65–99)

## 2015-02-15 MED ORDER — FLUDEOXYGLUCOSE F - 18 (FDG) INJECTION
6.8500 | Freq: Once | INTRAVENOUS | Status: AC | PRN
  Administered 2015-02-15: 6.85 via INTRAVENOUS

## 2015-02-19 ENCOUNTER — Ambulatory Visit (INDEPENDENT_AMBULATORY_CARE_PROVIDER_SITE_OTHER): Payer: Medicare Other | Admitting: Internal Medicine

## 2015-02-19 ENCOUNTER — Encounter: Payer: Self-pay | Admitting: Internal Medicine

## 2015-02-19 VITALS — BP 122/66 | HR 86 | Ht 67.5 in | Wt 101.2 lb

## 2015-02-19 DIAGNOSIS — R911 Solitary pulmonary nodule: Secondary | ICD-10-CM

## 2015-02-19 NOTE — Progress Notes (Signed)
Subjective:    Patient ID: Frank Garcia, male    DOB: April 25, 1929, 81 y.o.   MRN: [id number]  HPI      #FU Gold stage 3 copd - MM genotype  - feb 2009, fev1 1.13L/46% while on spiriva and advair with ex-desat, - June 2012:  Spirometry today (reviewed) - fev1 0.95L/38% and is a 110cc loss in fev1 in 3 years which approx 35cc/year - CAT score 16 Jan 2012 - Dec 2014: spiriva changed to tudorza due to urinary retention - Jan 2015: advair changed to [facility]  # pulmonary nodules, - RUL apical scar like density  - 2010 and 2011 -absent  - 2012 and 2013 - new and stable -03/24/13  - progression to 1.6cm    -  Onncimmne test reesult 04/29/13 is LOW level for 7 ung cancer proteins.  - Xpresys indi test done 04/29/13 is LIKELY BENGIN for nodule at 86% probability - 09/01/13 - stabl bilateral UL scarring. REC: annual low dose CT  # ex-smoker,   #Cachexia   - Body mass index is 18.17 kg/(m^2). on 11/13/2011 (weight 116# and up 5# since June 2012)  - - Body mass index is 16.94 kg/(m^2). on 06/09/2013 - Body mass index is 15.88 kg/(m^2). on 01/27/2014 - Body mass index is 16.06 kg/(m^2). on 05/07/2014    #Shingles - [facility] 2013     #Recurrent COPD exacerbation - March 2013: Office treatment for COPD exacerbation - May 2013: Office treatment with doxycycline and prednisone for COPD exacerbation  - August 2030: Started roflumilast May 2-14 - office RX Possible COPDAE./ Rx doxy and pred Dec 2014 - office Rx Prdnisone and Augmentin. STopped roflumilast Mary 2015 - admit LLL PNA - Pneumoccoccal CAP  #hoarse voice - ENT evaluation and 2013 normal   OV 01/27/2014  Chief Complaint  Patient presents with  . Follow-up    Pt hospitalized in May for pna. Pt c/o cough with white and clear mucous, increase in SOB with little activity. Pt has not had much mobility since 5/27. C/o intermittent chest tightness.    Followup for the above. since I last saw him he was admitted 12/30/2013 with a left lower  lobe pneumococcal pneumonia that was confirmed by urine strep antigen. Since discharge he is feeling better. He presents with his daughter today for followup.the admission is left him with continued weight loss, cachexia and poor appetite despite drinking protein. However, for the last 3 days he has increased cough, dyspnea, increasing sputum volume and slight increase in color to thick white. He feels COPD exacerbation again. He and his daughter have questions on prognosis and overall expectation. He does not have home physical therapy. He is very interested in copd research trials HE continues his copd tudorza, breo and alb nebs and 3L o2 without fail  I think you are in copd flare up again HAve CXR 2 view to ensure pneumonia from MAy 2015 clearing up Take doxycycline 175m po twice daily x 5 days; take after meals and avoid sunlight Take prednisone 40 mg daily x 2 days, then 277mdaily x 2 days, then 1017maily x 2 days, then 5mg51mily x 2 days and stop Continue o2 and other copd nebs as before\ We will arrange home PT for you to help with strength  #WEight loss  - likely due to copd but talk to pcp No PCP Per Patient    Followup  4 weeks or sooner if needed   OV 02/23/2014  Chief  Complaint  Patient presents with  . Follow-up    Pt states with activity outdoors he becomes SOB. C/o increase in cough with intermittent yellow/orange mucous. Denies CP.     He has recovered o from recent COPD exacerbation. Improve dyspnea and improved chest tightness and improve wheezing. Improve fatigue.He is complaining of coust of BREO. Taking tudorzan and o2.  Although, he is having chronic sinus drainage that has returned along with significant cough with associated secretions. He is not doing his nasal steroids and netti pot any part anymore. >>no changes  04/10/2014 Follow up COPD  Returns for 6 week COPD follow up.  Reports breathing is doing well overall, does note some occasional prod cough with  light yellow mucus x2 weeks.  Does complain of thick mucus in am, once gets up then he feels much bette.r   Denies increased SOB, wheezing, tightness, f/c/s, n/v/d, hemoptysis.  no refills needed at this time. Remains on [place] .  CXR 01/2014 w/ chronic changes and no acute process.  Wt is better, up 3 lbs, eating some better  Discussed prevnar vaccine today.    OV 05/07/2014  Chief Complaint  Patient presents with  . Follow-up    Pt states when taking mucinex he has urinary retention. Pt states he has increase in SOB, PND, prod cough with orange and clear mucous x 1week. Pt denies CP/tightness.    Followup severe COPD and chronic respiratory failure with multiple recurrent exacerbations. \ \ Since last visit with me in June 2015 and falling with nurse practitioner in August 2015 overall he has been stable. He has been compliant with his oxygen and his triple combination inhaler therapy. Follow for the past few days he's had increased cough compared to baseline associated with change in weakness of sputum with some change in color of sputum nor change in dyspnea wheezing. Denies any chest pain fever chills or edema. He is frustrated about recurrent  COPD exacerbation. He does not want prednisone preferably if it can be avoided >>zpack   06/24/14 Acute OV  Complains of MR pt here for prod cough with light yellow mucus, increased SOB, some wheezing and tightness x2 days.  Taking mucinex without much help.  Denies f/c/s, n/v/d, hemoptysis., chest pain, orthopnea , edema.  Remains on [place].  >doxycyline rx  07/28/14  Acute OV  Pt complains of increased SOB, some prod cough with light tan mucus, PND, some sharp pain in the right lung w/ coughing x2 days. Denies f/c/s, n/v/d, hemoptysis, leg swelling.  Remains on [place].  On O2 3/m .  Had similar symptoms last visit 1 month ago was treated with doxycycline and a prednisone taper. Says that his symptoms  totally resolved and was improved up until 2 days ago. >>Levaquin rx   08/12/2014 Follow up PNA  Pt seen last visit for sick visit found to have new right apical PNA .  Tx w/ with Levaquin 750x 7  Returns today feeling much better . Says that he tolerated abx well, no n/v/d.  Cough, dyspnea and wheezing have improved  He denies any chest pain, orthopnea, PND or leg swelling Appetite is bett   OV 08/31/2014 Chief Complaint  Patient presents with  . Follow-up    Pt recently d/c from [facility] for pna. Pt stated his breathing has improved since d/c. Pt c/o mild cough with little mucus production-tan in color. Pt denies CP/tightness.     Followup severe COPD and chronic respiratroy  failure with oxygen dependence and severe protein calorie malnutrition and cachexia  - He was admitted 08/26/2014 through 08/27/14 with a left lower lobe pneumonia and discharged on Levaquin therapy. Last date is tomorrow. HE feels back to baseline. Blood culture is now growing BAcillus species but he denies any ocular, joint symptms. His main issue is low apppetite for several months despite ENSURE. No new issues.   10/12/2014 Follow up : COPD /Chronic Resp failiure On O2  Returns for 1 month . Breathing is doing well. Feels best in while.  Wants to know what he can take for PND, it bothers him with the oxygen cannula. Nose drips a lot. No fever, chest pain, discolored mucus , hemoptyiss or leg swelling.  Very excited his appetite is good, and gained up to 109lbs.  Flu , PVX and Prevnar utd.  Drives and goes to visit wife in [place] with dementia.  >>no changes   01/01/2015 Acute OV  Presents for an acute office visit.  Complains of wheezing, prod cough (clear-light yellow phlem), PND, nasal congestion for 1 week. Complains of tightness and more dyspnea. Pain in ribs with cough and deep breaths. Occasional wheezing .  No fever, hemotpysis, exertional chest pain , orthopena or edema.  Appetite is fair with no n/v/d.    Remains on O2 3l/m .  >CXR with right sided PNA , rx Augmentin   01/29/2015 Acute OV  Returns with persistent cough and congestion  Tx for bronchitis and right sided PNA 5/6 with Augmentin  Felt some better.  Seen 2 weeks ago with Dr. Chase Caller , improved from flare  Symptoms started to return last week With cough and congestion .  Augmentin called in for 10 days on 5/27.  He is feeling better but still winded easily and cough is keeping him up at night.  Seen 2 weeks ago with Dr. Chase Caller , improved from flare  On Dulera and Spiriva .  Denies hemoptysis , chest pain, orthopnea, edema or fever.  Appetite is low.    OV 02/19/2015  Chief Complaint  Patient presents with  . Follow-up    Pt here to discuss PET results. Breathing unchanged, prod cough (clear phlem). no wheezing, no chest tx   Here to discuss CT scan of the chest and PET scan results  He summoned nurse practitioner earlier this month. That illness visit resulted in a chest x-ray that resulted in a PET scan. PET scan has shown a new right upper lobe lung nodule adjacent to the area where he has a chronic scar. I personally visualized image and confirmed it. It is definitely new. It does not look infectious. It is new since January 2015. This was then followed up by a PET scan that shows this nodule to be PET hot I personally visualized and confirmed this. PET scan was done on 02/15/2015. The official PET scan report also notices some PET activity in the right hilar mediastinal area but to my personal review and review of the PET scan with Dr. Baltazar Apo interventional bronchoscopist we are not convinced about this latter findings   Patient currently feels in stable COPD health. There are no new issues.  He definitely wants to know her diagnosis and discuss his treatment options if this nodule were determined to be lung cancer   Review of Systems  Constitutional: Negative for fever and unexpected weight change.  HENT:  Positive for congestion. Negative for dental problem, ear pain, nosebleeds, postnasal drip, rhinorrhea, sinus pressure, sneezing, sore  throat and trouble swallowing.   Eyes: Negative for redness and itching.  Respiratory: Positive for cough and shortness of breath. Negative for chest tightness and wheezing.   Cardiovascular: Negative for palpitations and leg swelling.  Gastrointestinal: Negative for nausea and vomiting.  Genitourinary: Negative for dysuria.  Musculoskeletal: Negative for joint swelling.  Skin: Negative for rash.  Neurological: Negative for headaches.  Hematological: Does not bruise/bleed easily.  Psychiatric/Behavioral: Negative for dysphoric mood. The patient is not nervous/anxious.        Objective:   Physical Exam  Filed Vitals:   02/19/15 1448  BP: 122/66  Pulse: 86  Height: 5' 7.5" (1.715 m)  Weight: 101 lb 3.2 oz (45.904 kg)  SpO2: 93%    Discussion only visit. Focal exam shows no wheezes     Assessment & Plan:     ICD-9-CM ICD-10-CM   1. Lung nodule, solitary 793.11 R91.1    This is highly suspicious for early stage non-small cell lung cancer. It has developed in the last 18 months. He is not a surgical candidate because of his advanced COPD We discussed options of allowing the natural course of this disease versus working towards a diagnosis of lung cancer potential with the understanding that radiation therapy and all its pitfalls would be his best treatment option. I explained diagnosis of navigational bronchoscopy by both myself and Dr. Baltazar Apo in conjunction 2. Risks of NON-DIAGNOSIS, Additional workup.  pneumothorax, hemothorax, sedation complications and failure to come off the ventilator all explained along with cardiac arrest. He says that he has been fighting COPD all his life and does not want to give up. He says he will fight cancer as well. Therefore he wants to go through biopsy and potential radiation treatment with its limitations and  risks  Dr. Baltazar Apo met him. We will arrange for an electro navigational bronchoscopy and possible fiducial placement   > 50% of this > 25 min visit spent in face to face counseling or coordination of care   Dr. Brand Males, M.D., [facility].C.P Pulmonary and Critical Care Medicine Staff Physician [place] [facility] Pager: [phone], If no answer or between  15:00h - 7:00h: call [phone]  02/19/2015 3:33 PM

## 2015-02-19 NOTE — Patient Instructions (Addendum)
ICD-9-CM ICD-10-CM   1. Lung nodule, solitary 793.11 R91.1     Right upper lobe new lung nodule = June 2016 - new since Early 2015 Highly concerning for lung cancer - based on fact is PET HOT  PLAN  - dp super D CT chest on the 02/04/2015 CT scan chest - ENB bronch  +/- fiducial placement after super D cT with Dr Lamonte Sakai who met you today 02/19/2015    Followup  1 week after super D with myself or DR Lamonte Sakai or NP Tammy to go over results

## 2015-03-04 ENCOUNTER — Encounter: Payer: Self-pay | Admitting: Internal Medicine

## 2015-03-04 ENCOUNTER — Ambulatory Visit (INDEPENDENT_AMBULATORY_CARE_PROVIDER_SITE_OTHER): Payer: Medicare Other | Admitting: Internal Medicine

## 2015-03-04 VITALS — BP 108/54 | HR 66 | Ht 67.0 in | Wt 105.2 lb

## 2015-03-04 DIAGNOSIS — R918 Other nonspecific abnormal finding of lung field: Secondary | ICD-10-CM

## 2015-03-04 NOTE — Progress Notes (Signed)
   Subjective:    Patient ID: HASSAAN CRITE, male    DOB: [birth year], 81 y.o.   MRN: [id number]  HPI  Chief Complaint  Patient presents with  . Follow-up    Pt here to review super D CT. Pt denies any changes since last OV on 6.24.16     Review of Systems  Constitutional: Negative for fever and unexpected weight change.  HENT: Negative for congestion, dental problem, ear pain, nosebleeds, postnasal drip, rhinorrhea, sinus pressure, sneezing, sore throat and trouble swallowing.   Eyes: Negative for redness and itching.  Respiratory: Positive for cough and shortness of breath. Negative for chest tightness and wheezing.   Cardiovascular: Negative for palpitations and leg swelling.  Gastrointestinal: Negative for nausea and vomiting.  Genitourinary: Negative for dysuria.  Musculoskeletal: Negative for joint swelling.  Skin: Negative for rash.  Neurological: Negative for headaches.  Hematological: Does not bruise/bleed easily.  Psychiatric/Behavioral: Negative for dysphoric mood. The patient is not nervous/anxious.        Objective:   Physical Exam        Assessment & Plan:

## 2015-03-05 ENCOUNTER — Telehealth: Payer: Self-pay | Admitting: Internal Medicine

## 2015-03-05 NOTE — Telephone Encounter (Signed)
Rob  I believe [employer] D on  Your desk. Reminder for you to schedule ENB. I have copied Daneil Dan my RN who can be aware  THanks  Dr. Brand Males, M.D., [facility].C.P Pulmonary and Critical Care Medicine Staff Physician [place] [facility] Pager: [phone], If no answer or between  15:00h - 7:00h: call [phone]  03/05/2015 11:08 PM

## 2015-03-08 NOTE — Telephone Encounter (Signed)
Per [place] >> pt is aware of procedure date, time and location.

## 2015-03-08 NOTE — Telephone Encounter (Signed)
He is scheduled for 8:30am on Wed. Please make sure that the pt knows and that there haven't been any changes. Thanks

## 2015-03-09 ENCOUNTER — Encounter (HOSPITAL_COMMUNITY)
Admission: RE | Admit: 2015-03-09 | Discharge: 2015-03-09 | Disposition: A | Discharge status: Home / Self Care | Payer: Medicare Other | Source: Ambulatory Visit | Attending: Emergency Medicine | Admitting: Emergency Medicine

## 2015-03-09 ENCOUNTER — Encounter (HOSPITAL_COMMUNITY): Payer: Self-pay

## 2015-03-09 ENCOUNTER — Telehealth: Payer: Self-pay | Admitting: Emergency Medicine

## 2015-03-09 DIAGNOSIS — J9610 Chronic respiratory failure, unspecified whether with hypoxia or hypercapnia: Secondary | ICD-10-CM | POA: Diagnosis not present

## 2015-03-09 DIAGNOSIS — Z87891 Personal history of nicotine dependence: Secondary | ICD-10-CM | POA: Diagnosis not present

## 2015-03-09 DIAGNOSIS — J439 Emphysema, unspecified: Secondary | ICD-10-CM | POA: Diagnosis not present

## 2015-03-09 DIAGNOSIS — C3411 Malignant neoplasm of upper lobe, right bronchus or lung: Secondary | ICD-10-CM | POA: Diagnosis not present

## 2015-03-09 DIAGNOSIS — R911 Solitary pulmonary nodule: Secondary | ICD-10-CM | POA: Diagnosis present

## 2015-03-09 DIAGNOSIS — R339 Retention of urine, unspecified: Secondary | ICD-10-CM | POA: Diagnosis not present

## 2015-03-09 DIAGNOSIS — J60 Coalworker's pneumoconiosis: Secondary | ICD-10-CM | POA: Diagnosis not present

## 2015-03-09 DIAGNOSIS — J449 Chronic obstructive pulmonary disease, unspecified: Secondary | ICD-10-CM | POA: Diagnosis not present

## 2015-03-09 DIAGNOSIS — Z9981 Dependence on supplemental oxygen: Secondary | ICD-10-CM | POA: Diagnosis not present

## 2015-03-09 HISTORY — DX: Cough, unspecified: R05.9

## 2015-03-09 HISTORY — DX: Cough: R05

## 2015-03-09 HISTORY — DX: Personal history of other infectious and parasitic diseases: Z86.19

## 2015-03-09 HISTORY — DX: Unspecified cataract: H26.9

## 2015-03-09 HISTORY — DX: Unspecified osteoarthritis, unspecified site: M19.90

## 2015-03-09 HISTORY — DX: Emphysema, unspecified: J43.9

## 2015-03-09 HISTORY — DX: Pain in unspecified joint: M25.50

## 2015-03-09 HISTORY — DX: Unspecified chronic bronchitis: J42

## 2015-03-09 LAB — BASIC METABOLIC PANEL
Anion gap: 10 (ref 5–15)
BUN: 11 mg/dL (ref 6–20)
CO2: 27 mmol/L (ref 22–32)
Calcium: 9 mg/dL (ref 8.9–10.3)
Chloride: 102 mmol/L (ref 101–111)
Creatinine, Ser: 0.83 mg/dL (ref 0.61–1.24)
GFR calc Af Amer: >60 mL/min (ref >60)
Glucose, Bld: 104 mg/dL — ABNORMAL HIGH (ref 65–99)
Potassium: 3.6 mmol/L (ref 3.5–5.1)
SODIUM: 139 mmol/L (ref 135–145)

## 2015-03-09 LAB — CBC
HEMATOCRIT: 35.5 % — AB (ref 39.0–52.0)
Hemoglobin: 11.7 g/dL — ABNORMAL LOW (ref 13.0–17.0)
MCH: 27.3 pg (ref 26.0–34.0)
MCHC: 33 g/dL (ref 30.0–36.0)
MCV: 82.9 fL (ref 78.0–100.0)
PLATELETS: 236 10*3/uL (ref 150–400)
RBC: 4.28 MIL/uL (ref 4.22–5.81)
RDW: 15.4 % (ref 11.5–15.5)
WBC: 8.4 10*3/uL (ref 4.0–10.5)

## 2015-03-09 NOTE — Progress Notes (Signed)
Requested orders from Dr.Byrum's office.States doctor won't be in until this afternoon.

## 2015-03-09 NOTE — Telephone Encounter (Signed)
Christy in [facility] returning call - [phone]. She said it is ok to wait until RB comes to office this p.m. To call her back.

## 2015-03-09 NOTE — Progress Notes (Signed)
Anesthesia Chart Review:  Pt is 81 year old male scheduled for video bronchoscopy with endobronchial navigation on 03/10/2015 with Dr. Lamonte Sakai.   Pulmonologist is Dr. Chase Caller.   PMH includes: COPD, emphysema, chronic bronchitis, asthma. Former smoker. BMI 16.  Medications include: albuterol, flonase, dulera, spiriva.   Preoperative labs reviewed.    Chest x-ray 01/29/2015 reviewed.  1. Persistent right perihilar density is grossly unchanged from the prior study, with extension along the right middle lobe. Underlying mass cannot be excluded. CT of the chest would be helpful for further evaluation. 2. Mild bibasilar atelectasis noted.  EKG 08/26/2014: Sinus tachycardia with irregular rate. Nonspecific IVCD with LAD. Inferior infarct, old. Repol abnrm, severe global ischemia (LM/MVD).  EKG was obtained in the setting of acute pneumonia. Will repeat DOS.   Echo 09/17/2013:  - Left ventricle: The cavity size was normal. Wall thickness was normal. Systolic function was normal. The estimated ejection fraction was in the range of 55% to 60%. Wall motion was normal; there were no regional wall motion abnormalities. Doppler parameters are consistent with abnormal left ventricular relaxation (grade 1 diastolic dysfunction). - Aortic valve: Mild regurgitation. - Mitral valve: Calcified annulus. - Pulmonary arteries: Systolic pressure was mildly increased. PA peak pressure: 28m Hg (S).  Nuclear stress test 07/16/2013:  Low risk stress nuclear study . No clear evidence of ischemia. LV Ejection Fraction: 68%. LV Wall Motion: NL LV Function; NL Wall Motion.  Reviewed case with Dr. JLinna Caprice   If EKG acceptable DOS, I anticipate pt can proceed as scheduled.   AWilleen Cass FNP-BC [facility]/Anesthesiology Phone: ([phone]/07/2015 2:40 PM

## 2015-03-09 NOTE — Anesthesia Preprocedure Evaluation (Addendum)
Anesthesia Evaluation  Patient identified by MRN, date of birth, ID band Patient awake    Reviewed: Allergy & Precautions, NPO status , Patient's Chart, lab work & pertinent test results, reviewed documented beta blocker date and time   Airway Mallampati: II   Neck ROM: Full    Dental  (+) Edentulous Upper, Poor Dentition, Missing   Pulmonary shortness of breath, COPD COPD inhaler, former smoker (60 pack year),  breath sounds clear to auscultation        Cardiovascular Rhythm:Regular  ECHO 2015 EF 60%, STRESS 2014 no ischemia   Neuro/Psych    GI/Hepatic negative GI ROS, Neg liver ROS,   Endo/Other  negative endocrine ROS  Renal/GU negative Renal ROS     Musculoskeletal   Abdominal (+)  Abdomen: soft.    Peds  Hematology 11/35   Anesthesia Other Findings   Reproductive/Obstetrics                            Anesthesia Physical Anesthesia Plan  ASA: III  Anesthesia Plan: General   Post-op Pain Management:    Induction: Intravenous  Airway Management Planned: Oral ETT  Additional Equipment:   Intra-op Plan:   Post-operative Plan: Extubation in OR  Informed Consent: I have reviewed the patients History and Physical, chart, labs and discussed the procedure including the risks, benefits and alternatives for the proposed anesthesia with the patient or authorized representative who has indicated his/her understanding and acceptance.     Plan Discussed with:   Anesthesia Plan Comments: (reported to have severe COPD, minimal narcotics)        Anesthesia Quick Evaluation

## 2015-03-09 NOTE — Progress Notes (Addendum)
Cardiologist denies having one  Medical Md is Dr.Murali Ramaswamy  EKG in epic from 08-26-14  CXR in epic from 01/2015 and Chest CT in from 01/2015  Echo reports in epic from 2013/2015  Stress test report in epic from 2014  Heart cath denies ever having one

## 2015-03-09 NOTE — Telephone Encounter (Signed)
Done

## 2015-03-09 NOTE — Pre-Procedure Instructions (Signed)
Frank Garcia  03/09/2015      [id number] [address] [place]Phone: [phone]: [phone] [facility] [address] [place]Phone: [phone]: [phone]   Your procedure is scheduled on Wed, July 13 @ 8:30 AM  [facility]  Call this number if you have problems the morning of surgery:  [phone]   Remember:  Do not eat food or drink liquids after midnight.  Take these medicines the morning of surgery with A SIP OF WATER Albuterol<Bring Your Inhaler With You>,Flonase(Fluticasone),Dulera,Rapaflo,and Spiriva.               No Goody's,BC's,Aleve,Aspirin,Ibuprofen,Fish Oil,or any Herbal Medications.   Do not wear jewelry.  Do not wear lotions, powders, or colognes.  You may wear deodorant.  Men may shave face and neck.  Do not bring valuables to the [facility].  [facility] any belongings or [facility].  Contacts, dentures or bridgework may not be worn into surgery.  Leave your suitcase in the car.  After surgery it may be brought to your room.  For patients admitted to the [facility], discharge time will be determined by your treatment team.  Patients discharged the day of surgery will not be allowed to drive home.    Special instructions:  [name] - Preparing for Surgery  Before surgery, you can play an important role.  Because skin is not sterile, your skin needs to be as free of germs as possible.  You can reduce the number of germs on you skin by washing with CHG (chlorahexidine gluconate) soap before surgery.  CHG is an antiseptic cleaner which kills germs and bonds with the skin to continue killing germs even after washing.  Please DO NOT use if you have an  allergy to CHG or antibacterial soaps.  If your skin becomes reddened/irritated stop using the CHG and inform your nurse when you arrive at Short Stay.  Do not shave (including legs and underarms) for at least 48 hours prior to the first CHG shower.  You may shave your face.  Please follow these instructions carefully:   1.  Shower with CHG Soap the night before surgery and the                                morning of Surgery.  2.  If you choose to wash your hair, wash your hair first as usual with your       normal shampoo.  3.  After you shampoo, rinse your hair and body thoroughly to remove the                      Shampoo.  4.  Use CHG as you would any other liquid soap.  You can apply chg directly       to the skin and wash gently with scrungie or a clean washcloth.  5.  Apply the CHG Soap to your body ONLY FROM THE NECK DOWN.        Do not use on open wounds or open sores.  Avoid contact with your eyes,       ears,  mouth and genitals (private parts).  Wash genitals (private parts)       with your normal soap.  6.  Wash thoroughly, paying special attention to the area where your surgery        will be performed.  7.  Thoroughly rinse your body with warm water from the neck down.  8.  DO NOT shower/wash with your normal soap after using and rinsing off       the CHG Soap.  9.  Pat yourself dry with a clean towel.            10.  Wear clean pajamas.            11.  Place clean sheets on your bed the night of your first shower and do not        sleep with pets.  Day of Surgery  Do not apply any lotions/deoderants the morning of surgery.  Please wear clean clothes to the [facility]/surgery center.    Please read over the following fact sheets that you were given. Pain Booklet and Coughing and Deep Breathing

## 2015-03-09 NOTE — Telephone Encounter (Signed)
Called short stay and line just keeps ringing and no one picked up. No VM Short stay calling for orders on this pt. Please advise RB thanks

## 2015-03-10 ENCOUNTER — Ambulatory Visit (HOSPITAL_COMMUNITY): Payer: Medicare Other | Admitting: Emergency Medicine

## 2015-03-10 ENCOUNTER — Encounter (HOSPITAL_COMMUNITY)
Admission: RE | Disposition: A | Discharge status: Home / Self Care | Payer: Self-pay | Source: Ambulatory Visit | Attending: Emergency Medicine

## 2015-03-10 ENCOUNTER — Ambulatory Visit (HOSPITAL_COMMUNITY): Payer: Medicare Other

## 2015-03-10 ENCOUNTER — Ambulatory Visit (HOSPITAL_COMMUNITY)
Admission: RE | Admit: 2015-03-10 | Discharge: 2015-03-10 | Disposition: A | Discharge status: Home / Self Care | Payer: Medicare Other | Source: Ambulatory Visit | Attending: Emergency Medicine | Admitting: Emergency Medicine

## 2015-03-10 ENCOUNTER — Encounter (HOSPITAL_COMMUNITY): Payer: Self-pay | Admitting: *Deleted

## 2015-03-10 ENCOUNTER — Ambulatory Visit (HOSPITAL_COMMUNITY): Payer: Medicare Other | Admitting: Anesthesiology

## 2015-03-10 DIAGNOSIS — C3411 Malignant neoplasm of upper lobe, right bronchus or lung: Secondary | ICD-10-CM | POA: Insufficient documentation

## 2015-03-10 DIAGNOSIS — Z9889 Other specified postprocedural states: Secondary | ICD-10-CM

## 2015-03-10 DIAGNOSIS — R911 Solitary pulmonary nodule: Secondary | ICD-10-CM | POA: Diagnosis not present

## 2015-03-10 DIAGNOSIS — Z419 Encounter for procedure for purposes other than remedying health state, unspecified: Secondary | ICD-10-CM

## 2015-03-10 DIAGNOSIS — J449 Chronic obstructive pulmonary disease, unspecified: Secondary | ICD-10-CM | POA: Insufficient documentation

## 2015-03-10 DIAGNOSIS — J60 Coalworker's pneumoconiosis: Secondary | ICD-10-CM | POA: Insufficient documentation

## 2015-03-10 DIAGNOSIS — Z87891 Personal history of nicotine dependence: Secondary | ICD-10-CM | POA: Insufficient documentation

## 2015-03-10 DIAGNOSIS — J9610 Chronic respiratory failure, unspecified whether with hypoxia or hypercapnia: Secondary | ICD-10-CM | POA: Insufficient documentation

## 2015-03-10 DIAGNOSIS — Z9981 Dependence on supplemental oxygen: Secondary | ICD-10-CM | POA: Insufficient documentation

## 2015-03-10 DIAGNOSIS — J439 Emphysema, unspecified: Secondary | ICD-10-CM | POA: Insufficient documentation

## 2015-03-10 DIAGNOSIS — R339 Retention of urine, unspecified: Secondary | ICD-10-CM | POA: Insufficient documentation

## 2015-03-10 HISTORY — PX: VIDEO BRONCHOSCOPY WITH ENDOBRONCHIAL NAVIGATION: SHX6175

## 2015-03-10 LAB — PROTIME-INR
INR: 1.21 (ref 0.00–1.49)
Prothrombin Time: 15.5 seconds — ABNORMAL HIGH (ref 11.6–15.2)

## 2015-03-10 LAB — APTT: aPTT: 38 seconds — ABNORMAL HIGH (ref 24–37)

## 2015-03-10 SURGERY — VIDEO BRONCHOSCOPY WITH ENDOBRONCHIAL NAVIGATION
Anesthesia: General

## 2015-03-10 MED ORDER — FENTANYL CITRATE (PF) 250 MCG/5ML IJ SOLN
INTRAMUSCULAR | Status: AC
  Filled 2015-03-10: qty 5

## 2015-03-10 MED ORDER — ONDANSETRON HCL 4 MG/2ML IJ SOLN
INTRAMUSCULAR | Status: DC | PRN
  Administered 2015-03-10: 4 mg via INTRAVENOUS

## 2015-03-10 MED ORDER — PHENYLEPHRINE HCL 10 MG/ML IJ SOLN
INTRAMUSCULAR | Status: DC | PRN
  Administered 2015-03-10: 80 ug via INTRAVENOUS

## 2015-03-10 MED ORDER — FENTANYL CITRATE (PF) 100 MCG/2ML IJ SOLN
INTRAMUSCULAR | Status: DC | PRN
  Administered 2015-03-10: 25 ug via INTRAVENOUS
  Administered 2015-03-10: 50 ug via INTRAVENOUS

## 2015-03-10 MED ORDER — ARTIFICIAL TEARS OP OINT
TOPICAL_OINTMENT | OPHTHALMIC | Status: AC
  Filled 2015-03-10: qty 3.5

## 2015-03-10 MED ORDER — ROCURONIUM BROMIDE 100 MG/10ML IV SOLN
INTRAVENOUS | Status: DC | PRN
  Administered 2015-03-10: 10 mg via INTRAVENOUS
  Administered 2015-03-10: 30 mg via INTRAVENOUS

## 2015-03-10 MED ORDER — LIDOCAINE HCL (CARDIAC) 20 MG/ML IV SOLN
INTRAVENOUS | Status: DC | PRN
  Administered 2015-03-10: 100 mg via INTRAVENOUS
  Administered 2015-03-10: 50 mg via INTRATRACHEAL

## 2015-03-10 MED ORDER — PROPOFOL 10 MG/ML IV BOLUS
INTRAVENOUS | Status: DC | PRN
  Administered 2015-03-10: 120 mg via INTRAVENOUS

## 2015-03-10 MED ORDER — ONDANSETRON HCL 4 MG/2ML IJ SOLN
INTRAMUSCULAR | Status: AC
  Filled 2015-03-10: qty 2

## 2015-03-10 MED ORDER — SUCCINYLCHOLINE CHLORIDE 20 MG/ML IJ SOLN
INTRAMUSCULAR | Status: DC | PRN
  Administered 2015-03-10: 100 mg via INTRAVENOUS

## 2015-03-10 MED ORDER — FENTANYL CITRATE (PF) 100 MCG/2ML IJ SOLN: 25.0000 ug | INTRAMUSCULAR | Status: DC | PRN

## 2015-03-10 MED ORDER — LIDOCAINE HCL (CARDIAC) 20 MG/ML IV SOLN
INTRAVENOUS | Status: AC
  Filled 2015-03-10: qty 10

## 2015-03-10 MED ORDER — SUCCINYLCHOLINE CHLORIDE 20 MG/ML IJ SOLN
INTRAMUSCULAR | Status: AC
  Filled 2015-03-10: qty 1

## 2015-03-10 MED ORDER — NEOSTIGMINE METHYLSULFATE 10 MG/10ML IV SOLN
INTRAVENOUS | Status: DC | PRN
  Administered 2015-03-10: 2 mg via INTRAVENOUS

## 2015-03-10 MED ORDER — ROCURONIUM BROMIDE 50 MG/5ML IV SOLN
INTRAVENOUS | Status: AC
  Filled 2015-03-10: qty 1

## 2015-03-10 MED ORDER — 0.9 % SODIUM CHLORIDE (POUR BTL) OPTIME
TOPICAL | Status: DC | PRN
  Administered 2015-03-10: 1000 mL

## 2015-03-10 MED ORDER — PHENYLEPHRINE 40 MCG/ML (10ML) SYRINGE FOR IV PUSH (FOR BLOOD PRESSURE SUPPORT)
PREFILLED_SYRINGE | INTRAVENOUS | Status: AC
  Filled 2015-03-10: qty 10

## 2015-03-10 MED ORDER — GLYCOPYRROLATE 0.2 MG/ML IJ SOLN
INTRAMUSCULAR | Status: DC | PRN
  Administered 2015-03-10: 0.3 mg via INTRAVENOUS

## 2015-03-10 MED ORDER — PHENYLEPHRINE HCL 10 MG/ML IJ SOLN
10.0000 mg | INTRAVENOUS | Status: DC | PRN
  Administered 2015-03-10: 100 ug/min via INTRAVENOUS

## 2015-03-10 MED ORDER — MEPERIDINE HCL 25 MG/ML IJ SOLN: 6.2500 mg | INTRAMUSCULAR | Status: DC | PRN

## 2015-03-10 MED ORDER — EPHEDRINE SULFATE 50 MG/ML IJ SOLN
INTRAMUSCULAR | Status: AC
  Filled 2015-03-10: qty 1

## 2015-03-10 MED ORDER — PROMETHAZINE HCL 25 MG/ML IJ SOLN: 6.2500 mg | INTRAMUSCULAR | Status: DC | PRN

## 2015-03-10 MED ORDER — GLYCOPYRROLATE 0.2 MG/ML IJ SOLN
INTRAMUSCULAR | Status: AC
  Filled 2015-03-10: qty 2

## 2015-03-10 MED ORDER — PROPOFOL 10 MG/ML IV BOLUS
INTRAVENOUS | Status: AC
  Filled 2015-03-10: qty 20

## 2015-03-10 MED ORDER — LIDOCAINE HCL (CARDIAC) 20 MG/ML IV SOLN
INTRAVENOUS | Status: AC
  Filled 2015-03-10: qty 5

## 2015-03-10 MED ORDER — NEOSTIGMINE METHYLSULFATE 10 MG/10ML IV SOLN
INTRAVENOUS | Status: AC
  Filled 2015-03-10: qty 1

## 2015-03-10 MED ORDER — LACTATED RINGERS IV SOLN
INTRAVENOUS | Status: DC | PRN
  Administered 2015-03-10 (×2): via INTRAVENOUS

## 2015-03-10 SURGICAL SUPPLY — 38 items
BRUSH BIOPSY BRONCH 10 SDTNB (MISCELLANEOUS) ×2 IMPLANT
BRUSH BIOPSY BRONCH 10MM SDTNB (MISCELLANEOUS) ×1
BRUSH CYTOL CELLEBRITY 1.5X140 (MISCELLANEOUS) ×6 IMPLANT
BRUSH SUPERTRAX BIOPSY (INSTRUMENTS) IMPLANT
BRUSH SUPERTRAX NDL-TIP CYTO (INSTRUMENTS) ×3 IMPLANT
CANISTER SUCTION 2500CC (MISCELLANEOUS) ×3 IMPLANT
CHANNEL WORK EXTEND EDGE 180 (KITS) IMPLANT
CHANNEL WORK EXTEND EDGE 45 (KITS) IMPLANT
CHANNEL WORK EXTEND EDGE 90 (KITS) IMPLANT
CONT SPEC 4OZ CLIKSEAL STRL BL (MISCELLANEOUS) ×6 IMPLANT
COVER TABLE BACK 60X90 (DRAPES) ×3 IMPLANT
FILTER STRAW FLUID ASPIR (MISCELLANEOUS) IMPLANT
FORCEPS BIOP SUPERTRX PREMAR (INSTRUMENTS) ×3 IMPLANT
GAUZE SPONGE 4X4 12PLY STRL (GAUZE/BANDAGES/DRESSINGS) ×3 IMPLANT
GLOVE BIO SURGEON STRL SZ 6.5 (GLOVE) ×2 IMPLANT
GLOVE BIO SURGEON STRL SZ7.5 (GLOVE) ×6 IMPLANT
GLOVE BIO SURGEONS STRL SZ 6.5 (GLOVE) ×1
KIT CLEAN ENDO COMPLIANCE (KITS) ×3 IMPLANT
KIT LOCATABLE GUIDE (CANNULA) IMPLANT
KIT MARKER FIDUCIAL DELIVERY (KITS) ×3 IMPLANT
KIT PROCEDURE EDGE 180 (KITS) ×3 IMPLANT
KIT PROCEDURE EDGE 45 (KITS) IMPLANT
KIT PROCEDURE EDGE 90 (KITS) IMPLANT
KIT ROOM TURNOVER OR (KITS) ×3 IMPLANT
MARKER FIDUCIAL SL NIT COIL (Implant Marker) ×9 IMPLANT
MARKER SKIN DUAL TIP RULER LAB (MISCELLANEOUS) ×3 IMPLANT
NEEDLE SUPERTRX PREMARK BIOPSY (NEEDLE) IMPLANT
NS IRRIG 1000ML POUR BTL (IV SOLUTION) ×3 IMPLANT
OIL SILICONE PENTAX (PARTS (SERVICE/REPAIRS)) ×3 IMPLANT
PAD ARMBOARD 7.5X6 YLW CONV (MISCELLANEOUS) ×6 IMPLANT
PATCHES PATIENT (LABEL) ×3 IMPLANT
SYR 20CC LL (SYRINGE) ×3 IMPLANT
SYR 20ML ECCENTRIC (SYRINGE) ×3 IMPLANT
SYR 50ML SLIP (SYRINGE) ×3 IMPLANT
TOWEL OR 17X24 6PK STRL BLUE (TOWEL DISPOSABLE) ×3 IMPLANT
TRAP SPECIMEN MUCOUS 40CC (MISCELLANEOUS) IMPLANT
TUBE CONNECTING 20'X1/4 (TUBING) ×1
TUBE CONNECTING 20X1/4 (TUBING) ×2 IMPLANT

## 2015-03-10 NOTE — Discharge Instructions (Signed)
Flexible Bronchoscopy, Care After °These instructions give you information on caring for yourself after your procedure. Your doctor may also give you more specific instructions. Call your doctor if you have any problems or questions after your procedure. °HOME CARE °· Do not eat or drink anything for 2 hours after your procedure. If you try to eat or drink before the medicine wears off, food or drink could go into your lungs. You could also burn yourself. °· After 2 hours have passed and when you can cough and gag normally, you may eat soft food and drink liquids slowly. °· The day after the test, you may eat your normal diet. °· You may do your normal activities. °· Keep all doctor visits. °GET HELP RIGHT AWAY IF: °· You get more and more short of breath. °· You get light-headed. °· You feel like you are going to pass out (faint). °· You have chest pain. °· You have new problems that worry you. °· You cough up more than a little blood. °· You cough up more blood than before. °MAKE SURE YOU: °· Understand these instructions. °· Will watch your condition. °· Will get help right away if you are not doing well or get worse. °Document Released: 06/11/2009 Document Revised: 08/19/2013 Document Reviewed: 04/18/2013 °Exit[employer]® Patient Information ©2015 [employer]. This information is not intended to replace advice given to you by your health care provider. Make sure you discuss any questions you have with your health care provider. ° °Please call our office for any questions or concerns. [phone].  ° ° ° °

## 2015-03-10 NOTE — Op Note (Signed)
Video Bronchoscopy with Electromagnetic Navigation Procedure Note  Date of Operation: 03/10/2015  Pre-op Diagnosis: Right upper lobe nodule  Post-op Diagnosis: Same  Surgeon: Baltazar Apo  Assistants: None  Anesthesia: General endotracheal anesthesia  Operation: Flexible video fiberoptic bronchoscopy with electromagnetic navigation and biopsies.  Estimated Blood Loss: Minimal  Complications: None apparent  Indications and History: Frank Garcia is a 81 y.o. male with history of tobacco use. He was found to have a small right upper lobe nodule on CT scan of the chest from 02/04/15. A PET scan performed 02/15/15 showed hypermetabolism of this lesion. Because he had severe emphysema recommendation was made to seek out tissue diagnosis nonsurgically. He was referred for navigational bronchoscopy and biopsies. The risks, benefits, complications, treatment options and expected outcomes were discussed with the patient.  The possibilities of pneumothorax, pneumonia, reaction to medication, pulmonary aspiration, perforation of a viscus, bleeding, failure to diagnose a condition and creating a complication requiring transfusion or operation were discussed with the patient who freely signed the consent.    Description of Procedure: The patient was seen in the Preoperative Area, was examined and was deemed appropriate to proceed.  The patient was taken to OR 10, identified as Diamantina Monks and the procedure verified as Flexible Video Fiberoptic Bronchoscopy.  A Time Out was held and the above information confirmed.   Prior to the date of the procedure a high-resolution CT scan of the chest was performed. Utilizing [place] a virtual tracheobronchial tree was generated to allow the creation of distinct navigation pathways to the patient's RUL parenchymal abnormality. After being taken to the operating room general anesthesia was initiated and the patient  was orally intubated. The video  fiberoptic bronchoscope was introduced via the endotracheal tube and a general inspection was performed which showed normal airways throughout. There were some thin pale secretions that were easily suctioned away. There were no endobronchial lesions seen. Two endobronchial brushings were performed in the right mainstem bronchus to be sent for Percepta molecular expression analysis. The extendable working channel and locator guide were introduced into the bronchoscope. The distinct navigation pathways prepared prior to this procedure were then utilized to navigate to within 0.4 - 1.2 cm of patient's nodule identified on CT scan. The extendable working channel was secured into place and the locator guide was withdrawn. Under fluoroscopic guidance transbronchial single and triple needle brushings, transbronchial Wang needle biopsies, and transbronchial forceps biopsies were performed to be sent for cytology and pathology. Preliminary cytology results were suggestive of a well-differentiated adenocarcinoma, final pathology results pending. Three gold fiducial markers were placed triangulating the right upper lobe nodule to facilitate possible stereotactic radiation therapy if he is deemed a nonsurgical candidate. At the end of the procedure a general airway inspection was performed and there was no evidence of active bleeding. The bronchoscope was removed.  The patient tolerated the procedure well. There was no significant blood loss and there were no obvious complications. A post-procedural chest x-ray is pending.  Samples: 1. Transbronchial needle brushings from RUL nodule 2. Transbronchial Wang needle biopsies from RUL nodule 3. Transbronchial forceps biopsies from RUL nodule 4. Endobronchial brushings from the right mainstem bronchus for Percepta testing   Plans:  The patient will be discharged from the PACU to home when recovered from anesthesia and after chest x-ray is reviewed. We will review the final  cytology, pathology results with the patient when they become available. Outpatient followup will be with Dr Lamonte Sakai or with Dr Chase Caller.  Baltazar Apo, MD, PhD 03/10/2015, 10:19 AM [facility] [phone] or if no answer [phone]

## 2015-03-10 NOTE — Interval H&P Note (Signed)
PCCM Interval Note  Mr Mcmahill presents today for further eval of his RUL nodule. He denies any new problems, has been having stable cough, sputum normal amount and light yellow. No new meds.   Filed Vitals:   03/10/15 0640  BP: 132/74  Pulse: 72  Temp: 97.7 F (36.5 C)  TempSrc: Oral  Resp: 20  Height: '5\' 7"'$  (1.702 m)  Weight: 45.813 kg (101 lb)  SpO2: 100%   Gen: Pleasant, thin man, in no distress,  normal affect  ENT: No lesions,  mouth clear,  oropharynx clear, no postnasal drip  Neck: No JVD, no TMG, no carotid bruits  Lungs: No use of accessory muscles, clear without rales or rhonchi  Cardiovascular: RRR, heart sounds normal, no murmur or gallops, no peripheral edema  Musculoskeletal: No deformities, no cyanosis or clubbing  Neuro: alert, non focal  Skin: Warm, no lesions or rashes   Recent Labs Lab 03/10/15 0650  INR 1.21    Recent Labs Lab 03/09/15 1040  HGB 11.7*  HCT 35.5*  WBC 8.4  PLT 236    Plans: reviewed the indications, risks of FOB with him today, in particular the fact that he has surrounding emphysema that may increase risk for PTX. He understands and elects to proceed. Will plan for TBBx's and brushings, Percepta testing.   Baltazar Apo, MD, PhD 03/10/2015, 8:38 AM [facility] [phone] or if no answer [phone]

## 2015-03-10 NOTE — Anesthesia Procedure Notes (Signed)
Procedure Name: Intubation Date/Time: 03/10/2015 8:45 AM Performed by: [place] Pink Pre-anesthesia Checklist: Patient identified, Emergency Drugs available, Suction available, Patient being monitored and Timeout performed Patient Re-evaluated:Patient Re-evaluated prior to inductionOxygen Delivery Method: Circle system utilized Preoxygenation: Pre-oxygenation with 100% oxygen Intubation Type: IV induction Ventilation: Mask ventilation without difficulty Laryngoscope Size: Mac and 3 Grade View: Grade I Tube type: Oral Tube size: 8.5 mm Number of attempts: 1 Airway Equipment and Method: Stylet and LTA kit utilized Placement Confirmation: ETT inserted through vocal cords under direct vision,  positive ETCO2 and breath sounds checked- equal and bilateral Secured at: 22 cm Tube secured with: Tape Dental Injury: Teeth and Oropharynx as per pre-operative assessment

## 2015-03-10 NOTE — Transfer of Care (Signed)
Immediate Anesthesia Transfer of Care Note  Patient: Frank Garcia  Procedure(s) Performed: Procedure(s): VIDEO BRONCHOSCOPY WITH ENDOBRONCHIAL NAVIGATION and PLACEMENT OF 4MM X 7MM FIDUCIAL MARKERS x3 (N/A)  Patient Location: PACU  Anesthesia Type:General  Level of Consciousness: awake  Airway & Oxygen Therapy: Patient Spontanous Breathing and Patient connected to nasal cannula oxygen  Post-op Assessment: Report given to RN and Post -op Vital signs reviewed and stable  Post vital signs: Reviewed and stable  Last Vitals:  Filed Vitals:   03/10/15 0640  BP: 132/74  Pulse: 72  Temp: 36.5 C  Resp: 20    Complications: No apparent anesthesia complications

## 2015-03-10 NOTE — Anesthesia Postprocedure Evaluation (Signed)
  Anesthesia Post-op Note  Patient: Frank Garcia  Procedure(s) Performed: Procedure(s): VIDEO BRONCHOSCOPY WITH ENDOBRONCHIAL NAVIGATION and PLACEMENT OF 4MM X 7MM FIDUCIAL MARKERS x3 (N/A)  Patient Location: PACU  Anesthesia Type:General  Level of Consciousness: awake, alert  and oriented  Airway and Oxygen Therapy: Patient Spontanous Breathing  Post-op Pain: none  Post-op Assessment: Post-op Vital signs reviewed, Patient's Cardiovascular Status Stable, Respiratory Function Stable, RESPIRATORY FUNCTION UNSTABLE and No signs of Nausea or vomiting              Post-op Vital Signs: Reviewed and stable  Last Vitals:  Filed Vitals:   03/10/15 1049  BP:   Pulse: 77  Temp:   Resp: 14    Complications: No apparent anesthesia complications

## 2015-03-10 NOTE — H&P (View-Only) (Signed)
Subjective:    Patient ID: Frank Garcia, male    DOB: [birth year], 81 y.o.   MRN: [id number]  HPI      #FU Gold stage 3 copd - MM genotype  - feb 2009, fev1 1.13L/46% while on spiriva and advair with ex-desat, - June 2012:  Spirometry today (reviewed) - fev1 0.95L/38% and is a 110cc loss in fev1 in 3 years which approx 35cc/year - CAT score 16 Jan 2012 - Dec 2014: spiriva changed to tudorza due to urinary retention - Jan 2015: advair changed to [facility]  # pulmonary nodules, - RUL apical scar like density  - 2010 and 2011 -absent  - 2012 and 2013 - new and stable -03/24/13  - progression to 1.6cm    -  Onncimmne test reesult 04/29/13 is LOW level for 7 ung cancer proteins.  - Xpresys indi test done 04/29/13 is LIKELY BENGIN for nodule at 86% probability - 09/01/13 - stabl bilateral UL scarring. REC: annual low dose CT  # ex-smoker,   #Cachexia   - Body mass index is 18.17 kg/(m^2). on 11/13/2011 (weight 116# and up 5# since June 2012)  - - Body mass index is 16.94 kg/(m^2). on 06/09/2013 - Body mass index is 15.88 kg/(m^2). on 01/27/2014 - Body mass index is 16.06 kg/(m^2). on 05/07/2014    #Shingles - [facility] 2013     #Recurrent COPD exacerbation - March 2013: Office treatment for COPD exacerbation - May 2013: Office treatment with doxycycline and prednisone for COPD exacerbation  - August 2030: Started roflumilast May 2-14 - office RX Possible COPDAE./ Rx doxy and pred Dec 2014 - office Rx Prdnisone and Augmentin. STopped roflumilast Mary 2015 - admit LLL PNA - Pneumoccoccal CAP  #hoarse voice - ENT evaluation and 2013 normal   OV 01/27/2014  Chief Complaint  Patient presents with  . Follow-up    Pt hospitalized in May for pna. Pt c/o cough with white and clear mucous, increase in SOB with little activity. Pt has not had much mobility since 5/27. C/o intermittent chest tightness.    Followup for the above. since I last saw him he was admitted 12/30/2013 with a left lower  lobe pneumococcal pneumonia that was confirmed by urine strep antigen. Since discharge he is feeling better. He presents with his daughter today for followup.the admission is left him with continued weight loss, cachexia and poor appetite despite drinking protein. However, for the last 3 days he has increased cough, dyspnea, increasing sputum volume and slight increase in color to thick white. He feels COPD exacerbation again. He and his daughter have questions on prognosis and overall expectation. He does not have home physical therapy. He is very interested in copd research trials HE continues his copd tudorza, breo and alb nebs and 3L o2 without fail  I think you are in copd flare up again HAve CXR 2 view to ensure pneumonia from MAy 2015 clearing up Take doxycycline 158m po twice daily x 5 days; take after meals and avoid sunlight Take prednisone 40 mg daily x 2 days, then 284mdaily x 2 days, then 1050maily x 2 days, then 5mg63mily x 2 days and stop Continue o2 and other copd nebs as before\ We will arrange home PT for you to help with strength  #WEight loss  - likely due to copd but talk to pcp No PCP Per Patient    Followup  4 weeks or sooner if needed   OV 02/23/2014  Chief  Complaint  Patient presents with  . Follow-up    Pt states with activity outdoors he becomes SOB. C/o increase in cough with intermittent yellow/orange mucous. Denies CP.     He has recovered o from recent COPD exacerbation. Improve dyspnea and improved chest tightness and improve wheezing. Improve fatigue.He is complaining of coust of BREO. Taking tudorzan and o2.  Although, he is having chronic sinus drainage that has returned along with significant cough with associated secretions. He is not doing his nasal steroids and netti pot any part anymore. >>no changes  04/10/2014 Follow up COPD  Returns for 6 week COPD follow up.  Reports breathing is doing well overall, does note some occasional prod cough with  light yellow mucus x2 weeks.  Does complain of thick mucus in am, once gets up then he feels much bette.r   Denies increased SOB, wheezing, tightness, f/c/s, n/v/d, hemoptysis.  no refills needed at this time. Remains on [place] .  CXR 01/2014 w/ chronic changes and no acute process.  Wt is better, up 3 lbs, eating some better  Discussed prevnar vaccine today.    OV 05/07/2014  Chief Complaint  Patient presents with  . Follow-up    Pt states when taking mucinex he has urinary retention. Pt states he has increase in SOB, PND, prod cough with orange and clear mucous x 1week. Pt denies CP/tightness.    Followup severe COPD and chronic respiratory failure with multiple recurrent exacerbations. \ \ Since last visit with me in June 2015 and falling with nurse practitioner in August 2015 overall he has been stable. He has been compliant with his oxygen and his triple combination inhaler therapy. Follow for the past few days he's had increased cough compared to baseline associated with change in weakness of sputum with some change in color of sputum nor change in dyspnea wheezing. Denies any chest pain fever chills or edema. He is frustrated about recurrent  COPD exacerbation. He does not want prednisone preferably if it can be avoided >>zpack   06/24/14 Acute OV  Complains of MR pt here for prod cough with light yellow mucus, increased SOB, some wheezing and tightness x2 days.  Taking mucinex without much help.  Denies f/c/s, n/v/d, hemoptysis., chest pain, orthopnea , edema.  Remains on [place].  >doxycyline rx  07/28/14  Acute OV  Pt complains of increased SOB, some prod cough with light tan mucus, PND, some sharp pain in the right lung w/ coughing x2 days. Denies f/c/s, n/v/d, hemoptysis, leg swelling.  Remains on [place].  On O2 3/m .  Had similar symptoms last visit 1 month ago was treated with doxycycline and a prednisone taper. Says that his symptoms  totally resolved and was improved up until 2 days ago. >>Levaquin rx   08/12/2014 Follow up PNA  Pt seen last visit for sick visit found to have new right apical PNA .  Tx w/ with Levaquin 750x 7  Returns today feeling much better . Says that he tolerated abx well, no n/v/d.  Cough, dyspnea and wheezing have improved  He denies any chest pain, orthopnea, PND or leg swelling Appetite is bett   OV 08/31/2014 Chief Complaint  Patient presents with  . Follow-up    Pt recently d/c from [facility] for pna. Pt stated his breathing has improved since d/c. Pt c/o mild cough with little mucus production-tan in color. Pt denies CP/tightness.     Followup severe COPD and chronic respiratroy  failure with oxygen dependence and severe protein calorie malnutrition and cachexia  - He was admitted 08/26/2014 through 08/27/14 with a left lower lobe pneumonia and discharged on Levaquin therapy. Last date is tomorrow. HE feels back to baseline. Blood culture is now growing BAcillus species but he denies any ocular, joint symptms. His main issue is low apppetite for several months despite ENSURE. No new issues.   10/12/2014 Follow up : COPD /Chronic Resp failiure On O2  Returns for 1 month . Breathing is doing well. Feels best in while.  Wants to know what he can take for PND, it bothers him with the oxygen cannula. Nose drips a lot. No fever, chest pain, discolored mucus , hemoptyiss or leg swelling.  Very excited his appetite is good, and gained up to 109lbs.  Flu , PVX and Prevnar utd.  Drives and goes to visit wife in [place] with dementia.  >>no changes   01/01/2015 Acute OV  Presents for an acute office visit.  Complains of wheezing, prod cough (clear-light yellow phlem), PND, nasal congestion for 1 week. Complains of tightness and more dyspnea. Pain in ribs with cough and deep breaths. Occasional wheezing .  No fever, hemotpysis, exertional chest pain , orthopena or edema.  Appetite is fair with no n/v/d.    Remains on O2 3l/m .  >CXR with right sided PNA , rx Augmentin   01/29/2015 Acute OV  Returns with persistent cough and congestion  Tx for bronchitis and right sided PNA 5/6 with Augmentin  Felt some better.  Seen 2 weeks ago with Dr. Chase Caller , improved from flare  Symptoms started to return last week With cough and congestion .  Augmentin called in for 10 days on 5/27.  He is feeling better but still winded easily and cough is keeping him up at night.  Seen 2 weeks ago with Dr. Chase Caller , improved from flare  On Dulera and Spiriva .  Denies hemoptysis , chest pain, orthopnea, edema or fever.  Appetite is low.    OV 02/19/2015  Chief Complaint  Patient presents with  . Follow-up    Pt here to discuss PET results. Breathing unchanged, prod cough (clear phlem). no wheezing, no chest tx   Here to discuss CT scan of the chest and PET scan results  He summoned nurse practitioner earlier this month. That illness visit resulted in a chest x-ray that resulted in a PET scan. PET scan has shown a new right upper lobe lung nodule adjacent to the area where he has a chronic scar. I personally visualized image and confirmed it. It is definitely new. It does not look infectious. It is new since January 2015. This was then followed up by a PET scan that shows this nodule to be PET hot I personally visualized and confirmed this. PET scan was done on 02/15/2015. The official PET scan report also notices some PET activity in the right hilar mediastinal area but to my personal review and review of the PET scan with Dr. Baltazar Apo interventional bronchoscopist we are not convinced about this latter findings   Patient currently feels in stable COPD health. There are no new issues.  He definitely wants to know her diagnosis and discuss his treatment options if this nodule were determined to be lung cancer   Review of Systems  Constitutional: Negative for fever and unexpected weight change.  HENT:  Positive for congestion. Negative for dental problem, ear pain, nosebleeds, postnasal drip, rhinorrhea, sinus pressure, sneezing, sore  throat and trouble swallowing.   Eyes: Negative for redness and itching.  Respiratory: Positive for cough and shortness of breath. Negative for chest tightness and wheezing.   Cardiovascular: Negative for palpitations and leg swelling.  Gastrointestinal: Negative for nausea and vomiting.  Genitourinary: Negative for dysuria.  Musculoskeletal: Negative for joint swelling.  Skin: Negative for rash.  Neurological: Negative for headaches.  Hematological: Does not bruise/bleed easily.  Psychiatric/Behavioral: Negative for dysphoric mood. The patient is not nervous/anxious.        Objective:   Physical Exam  Filed Vitals:   02/19/15 1448  BP: 122/66  Pulse: 86  Height: 5' 7.5" (1.715 m)  Weight: 101 lb 3.2 oz (45.904 kg)  SpO2: 93%    Discussion only visit. Focal exam shows no wheezes     Assessment & Plan:     ICD-9-CM ICD-10-CM   1. Lung nodule, solitary 793.11 R91.1    This is highly suspicious for early stage non-small cell lung cancer. It has developed in the last 18 months. He is not a surgical candidate because of his advanced COPD We discussed options of allowing the natural course of this disease versus working towards a diagnosis of lung cancer potential with the understanding that radiation therapy and all its pitfalls would be his best treatment option. I explained diagnosis of navigational bronchoscopy by both myself and Dr. Baltazar Apo in conjunction 2. Risks of NON-DIAGNOSIS, Additional workup.  pneumothorax, hemothorax, sedation complications and failure to come off the ventilator all explained along with cardiac arrest. He says that he has been fighting COPD all his life and does not want to give up. He says he will fight cancer as well. Therefore he wants to go through biopsy and potential radiation treatment with its limitations and  risks  Dr. Baltazar Apo met him. We will arrange for an electro navigational bronchoscopy and possible fiducial placement   > 50% of this > 25 min visit spent in face to face counseling or coordination of care   Dr. Brand Males, M.D., [facility].C.P Pulmonary and Critical Care Medicine Staff Physician [facility] Pager: [phone], If no answer or between  15:00h - 7:00h: call [phone]  02/19/2015 3:33 PM

## 2015-03-10 NOTE — Research (Signed)
Prospective Registry to Evaluate Percepta Bronchial Genomic Classifier Patient Data: The [place].   This patient, Frank Garcia, consented to the above clinical trial according to FDA regulations, GCP guidelines and [facility] SOPs. The study design has been explained to this patient and the patient demonstrated comprehension. No study procedures have been initiated before consenting of this patient. This patient was given sufficient time for reading the consent and asking questions. All risks, benefits and options have been thoroughly discussed. This patient was not coerced in any way to participate or to continue participation in this clinical trial. The patient has signed voluntarily at 07:20am on March 10, 2015. This patient was given a copy of this consent. Patient's daughter present for signing of the consent form and likewise was given sufficient time for reading the consent and asking questions. The patient's daughter also demonstrated comprehension of the study.    Doreatha Martin, RN [phone]

## 2015-03-10 NOTE — OR Nursing (Addendum)
Brushings for Percepta study collected and labeled '@0915'$ . Given to Dr. Lamonte Sakai (with forms) for study.

## 2015-03-11 ENCOUNTER — Encounter (HOSPITAL_COMMUNITY): Payer: Self-pay | Admitting: Emergency Medicine

## 2015-03-12 ENCOUNTER — Telehealth: Payer: Self-pay | Admitting: Adult Health

## 2015-03-12 MED ORDER — HYDROCODONE-HOMATROPINE 5-1.5 MG/5ML PO SYRP: 2.5000 mL | ORAL_SOLUTION | Freq: Every evening | ORAL | Status: DC | PRN

## 2015-03-12 NOTE — Telephone Encounter (Signed)
Per RB >> okay to refill.  Rx has been printed for RB to sign. Will call pt once it's signed.

## 2015-03-12 NOTE — Telephone Encounter (Signed)
Pt last had refill on hydromet 01/29/15 #240 ml x 0 refills Take 2.5 mLs by mouth at bedtime as needed. Last OV 03/04/15 w/ MR Pending appt 03/23/15 w/ MR MR and TP on vacation.  Please advise RB on refill thanks

## 2015-03-12 NOTE — Telephone Encounter (Signed)
Rx has been signed. Pt is aware that this is ready. Nothing further was needed.

## 2015-03-15 ENCOUNTER — Telehealth: Payer: Self-pay | Admitting: Internal Medicine

## 2015-03-15 MED ORDER — MOMETASONE FURO-FORMOTEROL FUM 200-5 MCG/ACT IN AERO: 2.0000 | INHALATION_SPRAY | Freq: Two times a day (BID) | RESPIRATORY_TRACT | Status: AC

## 2015-03-15 NOTE — Telephone Encounter (Signed)
Patient came by office to get samples of Dulera. Patient given 2 samples of Dulera. Nothing further needed.

## 2015-03-21 ENCOUNTER — Telehealth: Payer: Self-pay | Admitting: Internal Medicine

## 2015-03-21 NOTE — Telephone Encounter (Signed)
Frank Garcia  Seeing Frank Garcia 03/23/15 to give lung bx results from 03/05/15. Of many one of them is printed as positive for cancer. Pls print it out so I can share results with him. Will need to refer him to rad onc  Thanks  Dr. Brand Males, M.D., [facility].C.P Pulmonary and Critical Care Medicine Staff Physician [place] [facility] Pager: [phone], If no answer or between  15:00h - 7:00h: call [phone]  03/21/2015 12:47 PM

## 2015-03-22 NOTE — Telephone Encounter (Signed)
Results printed and placed with 7.26.16 schedule. Nothing further needed at this time.

## 2015-03-23 ENCOUNTER — Encounter: Payer: Self-pay | Admitting: Internal Medicine

## 2015-03-23 ENCOUNTER — Ambulatory Visit (INDEPENDENT_AMBULATORY_CARE_PROVIDER_SITE_OTHER): Payer: Medicare Other | Admitting: Internal Medicine

## 2015-03-23 VITALS — BP 120/58 | HR 73 | Ht 67.0 in | Wt 103.0 lb

## 2015-03-23 DIAGNOSIS — C3491 Malignant neoplasm of unspecified part of right bronchus or lung: Secondary | ICD-10-CM

## 2015-03-23 DIAGNOSIS — C3490 Malignant neoplasm of unspecified part of unspecified bronchus or lung: Secondary | ICD-10-CM | POA: Insufficient documentation

## 2015-03-23 NOTE — Patient Instructions (Signed)
ICD-9-CM ICD-10-CM   1. Adenocarcinoma of lung, stage 1, right 162.9 C34.91    Refer radiation onc Dr Pablo Ledger   Followup 3 months for copd

## 2015-03-23 NOTE — Progress Notes (Signed)
   Subjective:    Patient ID: Frank Garcia, male    DOB: October 24, 1928, 81 y.o.   MRN: [id number]  HPI    OV 03/23/2015  Chief Complaint  Patient presents with  . Follow-up    Pt here after lung biopsy. Pt states his breathing is unchanged.    Follow-up for new onset right upper lobe pulmonary nodule in the setting of advanced COPD. The nodule is PET hot. He underwent navigational bronchoscopy 03/10/2015 by Dr. Baltazar Apo. He's here to review the results. He presents with his daughter. He is anxious to know about the results, results show stage I a non-small cell lung cancer adenocarcinoma. Staging is based on PET scan. Diagnosed based on pathology from the navigational bronchoscopy   Review of Systems  Constitutional: Negative for fever and unexpected weight change.  HENT: Negative for congestion, dental problem, ear pain, nosebleeds, postnasal drip, rhinorrhea, sinus pressure, sneezing, sore throat and trouble swallowing.   Eyes: Negative for redness and itching.  Respiratory: Positive for cough and shortness of breath. Negative for chest tightness and wheezing.   Cardiovascular: Negative for palpitations and leg swelling.  Gastrointestinal: Negative for nausea and vomiting.  Genitourinary: Negative for dysuria.  Musculoskeletal: Negative for joint swelling.  Skin: Negative for rash.  Neurological: Negative for headaches.  Hematological: Does not bruise/bleed easily.  Psychiatric/Behavioral: Negative for dysphoric mood. The patient is not nervous/anxious.        Objective:   Physical Exam  Filed Vitals:   03/23/15 1534  BP: 120/58  Pulse: 73  Height: '5\' 7"'$  (1.702 m)  Weight: 103 lb (46.72 kg)  SpO2: 97%   Discussion only visit       Assessment & Plan:     ICD-9-CM ICD-10-CM   1. Adenocarcinoma of lung, stage 1, right 162.9 C34.91 Ambulatory referral to Radiation Oncology   He is a candidate for radiation therapy hopefully curative radiation. I've discussed in  general terms about the benefits, risks and limitations of radiation therapy. He is not asurgical candidate given his advanced COPD. He is on a chemotherapy candidate given the fact that stage I radiologically. I referred him to Dr. Pablo Ledger.  (> 50% of this 15 min visit spent in face to face counseling or/and coordination of care)  Fu 3 months  Dr. Brand Males, M.D., [facility].C.P Pulmonary and Critical Care Medicine Staff Physician [place] [facility] Pager: [phone], If no answer or between  15:00h - 7:00h: call [phone]  03/23/2015 4:19 PM

## 2015-03-31 ENCOUNTER — Ambulatory Visit
Admission: RE | Admit: 2015-03-31 | Discharge: 2015-03-31 | Disposition: A | Discharge status: Home / Self Care | Payer: Medicare Other | Source: Ambulatory Visit | Attending: Radiation Oncology | Admitting: Radiation Oncology

## 2015-03-31 ENCOUNTER — Ambulatory Visit: Payer: Medicare Other

## 2015-03-31 DIAGNOSIS — Z510 Encounter for antineoplastic radiation therapy: Secondary | ICD-10-CM | POA: Insufficient documentation

## 2015-03-31 DIAGNOSIS — F17210 Nicotine dependence, cigarettes, uncomplicated: Secondary | ICD-10-CM | POA: Insufficient documentation

## 2015-03-31 DIAGNOSIS — C3411 Malignant neoplasm of upper lobe, right bronchus or lung: Secondary | ICD-10-CM | POA: Insufficient documentation

## 2015-04-04 ENCOUNTER — Telehealth: Payer: Self-pay | Admitting: Internal Medicine

## 2015-04-04 NOTE — Telephone Encounter (Addendum)
Frank Garcia  There is an encounter open on 03/04/15 in my office slot but I  Have no instructions or notes or anything. Did I see patient that day? The cc says here to review super-d. Is that the day we thought no need to see him and sent him back. Did he pay copay that day?  Thanks  Dr. Brand Males, M.D., [facility].C.P Pulmonary and Critical Care Medicine Staff Physician [place] [facility] Pager: [phone], If no answer or between  15:00h - 7:00h: call [phone]  04/04/2015 7:36 PM

## 2015-04-05 NOTE — Telephone Encounter (Signed)
[place] I remember. So, that co-pay should have been credited to next OV. Was it done? If not, you need to take the issue with TD or [place] so it can be credited for future without fail. Or else is medicare violation. I will close out that OV note- fyi

## 2015-04-05 NOTE — Patient Instructions (Addendum)
Patient came for OV but decided he does not need this OV. His copay needs refund or used for next visit

## 2015-04-05 NOTE — Progress Notes (Signed)
Thoracic Location of Tumor / Histology:Adenocarcinoma of right lung, stage 1  Patient presented months ago with symptoms of: Patient states a spot was noticed on previous chest-xray about 12 years ago.folow up scans had been performed until recently PET 01/2015 revealed hypermetabolic neoplasm of right upper lobe.  Biopsies of right upper lobe lung on 03/10/15 Diagnosis TRANSBRONCHIAL NEEDLE ASPIRATION, NAVIGATION, RUL, TRIPLE BRUSH, A (SPECIMEN 1 OF 3, COLLECTED ON 03/10/15): SUSPICIOUS FOR MALIGNANCY.  Tobacco/Marijuana/Snuff/ETOH use:1.5 ppd x 40 years  Past/Anticipated interventions by cardiothoracic surgery, if any:03/10/15 VIDEO BRONCHOSCOPY WITH ENDOBRONCHIAL NAVIGATION right upper lobe.  Past/Anticipated interventions by medical oncology, if any: Not scheduled.  Signs/Symptoms  Weight changes, if any: maintained over last 30 days.  Respiratory complaints, if KVQ:QVZD/GLOVFIEPP. Wears 3 liters O2.  Hemoptysis, if any:No .   Pain issues, if any:  No  SAFETY ISSUES:  Prior radiation? No  Pacemaker/ICD? No  Possible current pregnancy?N/A  Is the patient on methotrexate?No  Current Complaints / other details:Married 64 years.Here with 1 of 3 children. Ambulates without cane. NKDA.

## 2015-04-05 NOTE — Telephone Encounter (Signed)
Pt will not get billed as long as the OV note was a "do not charge" visit. MR you can make the visit a "do not charge".

## 2015-04-05 NOTE — Telephone Encounter (Signed)
No co-pay was paid that day. That OV was the one that was not needed because it was before the biopsy.

## 2015-04-05 NOTE — Telephone Encounter (Signed)
Spoke with MR. Nothing further needed at this time.

## 2015-04-05 NOTE — Telephone Encounter (Signed)
i signed it as no charge.  Also, please call me NOW before you guys head home if piossible on another patient tht will be new and I cannot locate on ePIC

## 2015-04-07 ENCOUNTER — Other Ambulatory Visit: Payer: Self-pay | Admitting: Internal Medicine

## 2015-04-07 MED ORDER — TIOTROPIUM BROMIDE MONOHYDRATE 2.5 MCG/ACT IN AERS
2.0000 | INHALATION_SPRAY | Freq: Every day | RESPIRATORY_TRACT | Status: DC
Start: 2015-04-07 — End: 2015-04-09

## 2015-04-08 ENCOUNTER — Ambulatory Visit
Admission: RE | Admit: 2015-04-08 | Discharge: 2015-04-08 | Disposition: A | Discharge status: Home / Self Care | Payer: Medicare Other | Source: Ambulatory Visit | Attending: Radiation Oncology | Admitting: Radiation Oncology

## 2015-04-08 ENCOUNTER — Encounter: Payer: Self-pay | Admitting: Radiation Oncology

## 2015-04-08 VITALS — BP 114/79 | HR 73 | Temp 97.7°F | Resp 20 | Wt 102.0 lb

## 2015-04-08 DIAGNOSIS — C3491 Malignant neoplasm of unspecified part of right bronchus or lung: Secondary | ICD-10-CM

## 2015-04-08 NOTE — Progress Notes (Signed)
Radiation Oncology         [phone]) [phone] ________________________________  Initial Outpatient Consultation - Date: 04/08/2015   Name: Frank Garcia MRN: [id number]   DOB: May 11, 1929  REFERRING PHYSICIAN: Brand Males, MD  DIAGNOSIS AND STAGE: [name] is a 81 year old male presenting to clinic in regards to his non small cell lung cancer of the right upper lobe.  HISTORY OF PRESENT ILLNESS:[place] H Ore is a 81 y.o. male presenting to clinic in regards to his cancer of the right lung with no previous radiation. The patient currently manages his Stage III COPD and has been followed for a right upper lobe lung mass since 2012. This was recently noted to increase in size on a CT of the ches ton 02/04/2015. This measured 3m with no adenopathy noted at the time. A PET scan on 02/15/2015 showed this area to be hypermetabolic with an SUV of five. Hypermetabolic acitivty was also noted in the right hilum and subcorinal regions with an SUV of three. He underwent bronchoscopy and biopsy on 03/10/2015, which showed non-small cell carcinoma favoring adenocarincioma in the right upper lobe nodule. He is not felt to be a surgical canidate and was referred for radiation options in the management of his disease. The subcorinal and hiler nodes were not biospied. The patient reports that his weight often fluxuates, but he denies ever coughing up blood. The patient and his daughter expressed confusion in regards to the results of his most recent PET scan. The patient stated that he has no history of cancer and major serious illnesses, besides his COPD. The patient stated that he wears his oxygen most of the time, but can walk short distances without it. His daughter reports that he has been smoking since he was 89 but quit in 2009 and does not miss it. 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 His son currently has cancer of the pancrease and youngest daughter had cancer of the colon. PREVIOUS RADIATION THERAPY: No  Past medical, social and family history were reviewed in the electronic chart. Review of symptoms was reviewed in the electronic chart. Medications were reviewed in the electronic chart.   PHYSICAL EXAM: The patient is alert and oriented x3. Normal respiratory effort. Nasal cannula in place.   Filed Vitals:   04/08/15 1020  BP: 114/79  Pulse: 73  Temp: 97.7 F (36.5 C)  Resp: 20  .102 lb (46.267 kg)  IMPRESSION: SBlane Worthingtonis a 81year old male presenting to clinic in regards to cancer of the right lung. Possible treatment plans discussed during today's radiation oncology appointment included radiation therapy and stereotactic radiosurgery treatment  (and possibly chemotherapy depending on results of upcoming PET scan). The patient is currently successfully managing his COPD.   PLAN: We discussed that he has stage I lung cancer and the results of SBRT with provide a greater than 90% local control. We discussed that he may fail in the mediastinum hilum or elsewhere in the body and radiation was a local only process. We discussed the process of simulation and the use of a pad all to decrease respiratory motion. We discussed the use of 4-dimensional simulation to minimize normal lung tissue treated and the use of respiratory compression. We discussed 3- 5 treatments occurring every other day as an outpatient. We discussed these treatments will last about 10-20 minutes and  he would be here at the hospital for about an hour. We discussed that SBRT was unlikely to make his breathing any worse. It is also unlikely to make his breathing symptoms any better. We discussed possible side effects including his shoulder pain due to arm positioning and possible damage to the trachea given the proximity to the central chest. We discussed damage to other critical normal structures  including heart, ribs, lung collapse, chronic cough, and brachial plexus injury. We discussed that without treatment this  could develop into a more aggressive or even metastatic cancer. The patient is aware of the CT scan and planning session to take place as scheduled, with radiation treatment as an out patient to follow. The patient and his daughter were made aware of a PET scan to take place as scheduled. The PET scan will take place first, but is able to be canceled if advised by Dr. Chase Caller. We will schedule simulation after the PET scan. Additional education resources were provided. All questions and concerns vocalizede were addressed. All consent paperwork was discussed and signed. If the patient develops any further questions or concerns, he was encouraged to contact Dr. Pablo Ledger.  I will speak with Dr. Chase Caller on Monday and will await results of the PET scan. If the PET scan shows the lymph nodes to be stable or decreased, I will proceed on with RT to the right upper lobe nodule stereotactically. If the PET scan is positive, we will have to consider hospice or concurrent chemoradiation.  I spent 40 minutes  face to face with the patient and more than 50% of that time was spent in counseling and/or coordination of care.    This document serves as a record of services personally performed by Thea Silversmith , MD. It was created on her behalf by Lenn Cal, a trained medical scribe. The creation of this record is based on the scribe's personal observations and the provider's statements to them. This document has been checked and approved by the attending provider.   ------------------------------------------------  Thea Silversmith, MD

## 2015-04-09 ENCOUNTER — Other Ambulatory Visit: Payer: Self-pay | Admitting: *Deleted

## 2015-04-09 MED ORDER — TIOTROPIUM BROMIDE MONOHYDRATE 2.5 MCG/ACT IN AERS: 2.0000 | INHALATION_SPRAY | Freq: Every day | RESPIRATORY_TRACT | Status: AC

## 2015-04-10 ENCOUNTER — Encounter (HOSPITAL_COMMUNITY): Payer: Self-pay | Admitting: *Deleted

## 2015-04-10 ENCOUNTER — Inpatient Hospital Stay (HOSPITAL_COMMUNITY)
Admission: EM | Admit: 2015-04-10 | Discharge: 2015-04-12 | DRG: 871 | Disposition: A | Discharge status: Home / Self Care | Payer: Medicare Other | Attending: Internal Medicine | Admitting: Internal Medicine

## 2015-04-10 DIAGNOSIS — E43 Unspecified severe protein-calorie malnutrition: Secondary | ICD-10-CM | POA: Diagnosis present

## 2015-04-10 DIAGNOSIS — I10 Essential (primary) hypertension: Secondary | ICD-10-CM | POA: Diagnosis present

## 2015-04-10 DIAGNOSIS — C3411 Malignant neoplasm of upper lobe, right bronchus or lung: Secondary | ICD-10-CM | POA: Insufficient documentation

## 2015-04-10 DIAGNOSIS — A419 Sepsis, unspecified organism: Secondary | ICD-10-CM | POA: Diagnosis not present

## 2015-04-10 DIAGNOSIS — H269 Unspecified cataract: Secondary | ICD-10-CM | POA: Diagnosis present

## 2015-04-10 DIAGNOSIS — J449 Chronic obstructive pulmonary disease, unspecified: Secondary | ICD-10-CM | POA: Diagnosis present

## 2015-04-10 DIAGNOSIS — Z79899 Other long term (current) drug therapy: Secondary | ICD-10-CM

## 2015-04-10 DIAGNOSIS — Z825 Family history of asthma and other chronic lower respiratory diseases: Secondary | ICD-10-CM

## 2015-04-10 DIAGNOSIS — J189 Pneumonia, unspecified organism: Secondary | ICD-10-CM | POA: Diagnosis not present

## 2015-04-10 DIAGNOSIS — R918 Other nonspecific abnormal finding of lung field: Secondary | ICD-10-CM | POA: Diagnosis present

## 2015-04-10 DIAGNOSIS — Z87891 Personal history of nicotine dependence: Secondary | ICD-10-CM

## 2015-04-10 DIAGNOSIS — J45909 Unspecified asthma, uncomplicated: Secondary | ICD-10-CM | POA: Diagnosis present

## 2015-04-10 DIAGNOSIS — N400 Enlarged prostate without lower urinary tract symptoms: Secondary | ICD-10-CM | POA: Diagnosis present

## 2015-04-10 DIAGNOSIS — M1990 Unspecified osteoarthritis, unspecified site: Secondary | ICD-10-CM | POA: Diagnosis present

## 2015-04-10 DIAGNOSIS — E860 Dehydration: Secondary | ICD-10-CM | POA: Diagnosis present

## 2015-04-10 DIAGNOSIS — Z9981 Dependence on supplemental oxygen: Secondary | ICD-10-CM

## 2015-04-10 DIAGNOSIS — Z8619 Personal history of other infectious and parasitic diseases: Secondary | ICD-10-CM

## 2015-04-10 DIAGNOSIS — C3490 Malignant neoplasm of unspecified part of unspecified bronchus or lung: Secondary | ICD-10-CM | POA: Diagnosis present

## 2015-04-10 DIAGNOSIS — Z681 Body mass index (BMI) 19 or less, adult: Secondary | ICD-10-CM

## 2015-04-10 DIAGNOSIS — J9621 Acute and chronic respiratory failure with hypoxia: Secondary | ICD-10-CM | POA: Diagnosis present

## 2015-04-10 HISTORY — DX: Essential (primary) hypertension: I10

## 2015-04-10 HISTORY — DX: Malignant (primary) neoplasm, unspecified: C80.1

## 2015-04-10 MED ORDER — IPRATROPIUM-ALBUTEROL 0.5-2.5 (3) MG/3ML IN SOLN
3.0000 mL | Freq: Once | RESPIRATORY_TRACT | Status: AC
  Administered 2015-04-11: 3 mL via RESPIRATORY_TRACT
  Filled 2015-04-10: qty 3

## 2015-04-10 NOTE — ED Notes (Signed)
The pt has  Cancer he just recently was diagnosed with a mass in his lt chest.  The pain was worse today and the pain is worse with movement  And with inspiration.  He is on home 02 at 4 liters.  He lives at home with his family.  Alert oriented no distress.

## 2015-04-10 NOTE — ED Notes (Signed)
The pt is alert family at the bediside

## 2015-04-10 NOTE — ED Provider Notes (Signed)
This chart was scribed for [name], DO by Forrestine Him, ED Scribe. This patient was seen in room D36C/D36C and the patient's care was started 11:48 PM.   TIME SEEN: 11:48 PM   CHIEF COMPLAINT:  Chief Complaint  Patient presents with  . Chest Pain     HPI:  HPI Comments: Frank Garcia is a 81 y.o. male with a PMHx of COPD, history of a right chest mass since 2012 that has recently grown in size he was preparing to undergo radiation with Dr. Pablo Ledger who presents to the Emergency Department complaining of constant, ongoing sharp chest pain onset 2:00 PM this afternoon. Discomfort is exacerbated with deep breathing. No alleviating factors a this time. No OTC medications or home remedies attempted prior to arrival. No recent fever, chills, nausea, vomiting, diaphoresis, or worsening shortness of breath. Frank Garcia admits to previous episodes but states all episodes resolved after belching. Pt wears 3 liters of Oxygen at home. No history of heart failure. No history of PE or DVT. No lower extremity swelling. Has a chronic cough which he reports is unchanged. No known allergies to medications.  PCP/Pulm - Ramaswamy   ROS: See HPI Constitutional: no fever, chills, or diaphoresis Eyes: no drainage  ENT: no runny nose   Cardiovascular:  Positive for chest pain  Resp: no SOB  GI: no vomiting or nausea  GU: no dysuria Integumentary: no rash  Allergy: no hives  Musculoskeletal: no leg swelling  Neurological: no slurred speech ROS otherwise negative  PAST MEDICAL HISTORY/PAST SURGICAL HISTORY:  Past Medical History  Diagnosis Date  . BPH (benign prostatic hypertrophy)     takes Rapaflo daily  . Chronic airway obstruction, not elsewhere classified   . Asthma     uses Spiriva and Dulera daily  . Emphysema lung   . Chronic bronchitis   . Shortness of breath     Albuterol inhaler as needed;lying and exertion  . Pneumonia     "a few times" (08/26/2014)  . Cough     productive  but without fever  . Arthritis   . Joint pain   . Cataracts, bilateral     immature  . History of shingles     MEDICATIONS:  Prior to Admission medications   Medication Sig Start Date End Date Taking? Authorizing Provider  albuterol (PROVENTIL HFA;VENTOLIN HFA) 108 (90 BASE) MCG/ACT inhaler Inhale 2 puffs into the lungs every 4 (four) hours as needed for wheezing or shortness of breath. 01/27/14   Brand Males, MD  albuterol (PROVENTIL) (2.5 MG/3ML) 0.083% nebulizer solution Take 3 mLs (2.5 mg total) by nebulization every 2 (two) hours as needed for wheezing or shortness of breath. 01/27/14   Brand Males, MD  fluticasone (FLONASE) 50 MCG/ACT nasal spray Place 2 sprays into both nostrils daily. 01/11/15   Brand Males, MD  HYDROcodone-homatropine (HYDROMET) 5-1.5 MG/5ML syrup Take 2.5 mLs by mouth at bedtime as needed. 03/12/15   Collene Gobble, MD  mometasone-formoterol (DULERA) 200-5 MCG/ACT AERO Inhale 2 puffs into the lungs 2 (two) times daily. 03/15/15   Brand Males, MD  Nutritional Supplements (ENSURE HIGH PROTEIN PO) Take 1 Can by mouth daily.     Historical Provider, MD  RAPAFLO 8 MG CAPS capsule Take 1 capsule by mouth daily. 03/04/14   Historical Provider, MD  Tiotropium Bromide Monohydrate (SPIRIVA RESPIMAT) 2.5 MCG/ACT AERS Inhale 2 puffs into the lungs daily. 04/09/15   Brand Males, MD    ALLERGIES:  No Known Allergies  SOCIAL HISTORY:  Social History  Substance Use Topics  . Smoking status: Former Smoker -- 1.50 packs/day for 40 years    Types: Cigarettes  . Smokeless tobacco: Former Systems developer    Quit date: 04/07/2008     Comment: quit smoking 3+yrs ago  . Alcohol Use: No    FAMILY HISTORY: Family History  Problem Relation Age of Onset  . COPD Father     EXAM: BP 121/64 mmHg  Pulse 85  Temp(Src) 99.4 F (37.4 C) (Oral)  Resp 20  Ht 5' 7.5" (1.715 m)  Wt 103 lb (46.72 kg)  BMI 15.88 kg/m2  SpO2 97% CONSTITUTIONAL: Alert and oriented and responds  appropriately to questions. elderly, afebrile, non toxic HEAD: Normocephalic EYES: Conjunctivae clear, PERRL ENT: normal nose; no rhinorrhea; moist mucous membranes; pharynx without lesions noted NECK: Supple, no meningismus, no LAD  CARD: RRR; S1 and S2 appreciated; no murmurs, no clicks, no rubs, no gallops RESP: Normal chest excursion without splinting or tachypnea; breath sounds equal bilaterally but diminished at bases bilaterally ; no wheezes, no rhonchi, no rales, no hypoxia or respiratory distress, speaking full sentences ABD/GI: Normal bowel sounds; non-distended; soft, non-tender, no rebound, no guarding, no peritoneal signs BACK:  The back appears normal and is non-tender to palpation, there is no CVA tenderness EXT: Normal ROM in all joints; non-tender to palpation; no edema; normal capillary refill; no cyanosis, no calf tenderness or swelling    SKIN: Normal color for age and race; warm NEURO: Moves all extremities equally, sensation to light touch intact diffusely, cranial nerves II through XII intact PSYCH: The patient's mood and manner are appropriate. Grooming and personal hygiene are appropriate.  MEDICAL DECISION MAKING: Patient here with left-sided pleuritic chest pain. Has a history of a right chest mass and is planning to undergo radiation. Denies any history of PE or DVT but given this cancerous lesion, patient is at risk. We'll obtain cardiac labs, CT of patient's chest. He also has diminished breath sounds at his bases bilaterally but no wheezing. Denies any changes in his chronic shortness of breath or chronic cough. We'll give DuoNeb treatment and reassess.  ED PROGRESS: Patient has had some improvement after a DuoNeb and has better movement at his bases bilaterally. Heart showed a leukocytosis with left shift. Troponin negative. BNP normal. CT of patient's chest shows no pulmonary embolus on but he does have bibasilar air space opacities concerning for multifocal  pneumonia. Mass is again demonstrated in the right hilum and appears to have mildly increased in size. Patient's family reports when he has had pneumonia in the past he has become very sick, exacerbating his COPD. Have recommended admission for treatment with IV antibiotics for community-acquired pneumonia. Family agrees. We'll discuss with hospitalist.    EKG Interpretation  Date/Time:  Saturday April 10 2015 23:38:52 EDT Ventricular Rate:  80 PR Interval:  128 QRS Duration: 90 QT Interval:  372 QTC Calculation: 429 R Axis:   -44 Text Interpretation:  Sinus rhythm Left axis deviation Anteroseptal infarct, age indeterminate Confirmed by Jeneen Rinks  MD, [place] ([id number]) on 04/10/2015 11:41:03 PM         I personally performed the services described in this documentation, which was scribed in my presence. The recorded information has been reviewed and is accurate.   [place], DO 04/11/15 [phone]

## 2015-04-10 NOTE — ED Notes (Addendum)
MD at bedside. 

## 2015-04-11 ENCOUNTER — Encounter (HOSPITAL_COMMUNITY): Payer: Self-pay | Admitting: Radiology

## 2015-04-11 ENCOUNTER — Emergency Department (HOSPITAL_COMMUNITY): Payer: Medicare Other

## 2015-04-11 DIAGNOSIS — Z681 Body mass index (BMI) 19 or less, adult: Secondary | ICD-10-CM | POA: Diagnosis not present

## 2015-04-11 DIAGNOSIS — J449 Chronic obstructive pulmonary disease, unspecified: Secondary | ICD-10-CM

## 2015-04-11 DIAGNOSIS — E43 Unspecified severe protein-calorie malnutrition: Secondary | ICD-10-CM | POA: Diagnosis present

## 2015-04-11 DIAGNOSIS — J189 Pneumonia, unspecified organism: Secondary | ICD-10-CM | POA: Diagnosis present

## 2015-04-11 DIAGNOSIS — C3411 Malignant neoplasm of upper lobe, right bronchus or lung: Secondary | ICD-10-CM | POA: Diagnosis present

## 2015-04-11 DIAGNOSIS — J9621 Acute and chronic respiratory failure with hypoxia: Secondary | ICD-10-CM | POA: Diagnosis present

## 2015-04-11 DIAGNOSIS — I10 Essential (primary) hypertension: Secondary | ICD-10-CM | POA: Diagnosis present

## 2015-04-11 DIAGNOSIS — J45909 Unspecified asthma, uncomplicated: Secondary | ICD-10-CM | POA: Diagnosis present

## 2015-04-11 DIAGNOSIS — N400 Enlarged prostate without lower urinary tract symptoms: Secondary | ICD-10-CM | POA: Diagnosis present

## 2015-04-11 DIAGNOSIS — A419 Sepsis, unspecified organism: Secondary | ICD-10-CM | POA: Diagnosis present

## 2015-04-11 DIAGNOSIS — C3490 Malignant neoplasm of unspecified part of unspecified bronchus or lung: Secondary | ICD-10-CM

## 2015-04-11 DIAGNOSIS — Z87891 Personal history of nicotine dependence: Secondary | ICD-10-CM | POA: Diagnosis not present

## 2015-04-11 DIAGNOSIS — M1990 Unspecified osteoarthritis, unspecified site: Secondary | ICD-10-CM | POA: Diagnosis present

## 2015-04-11 DIAGNOSIS — Z79899 Other long term (current) drug therapy: Secondary | ICD-10-CM | POA: Diagnosis not present

## 2015-04-11 DIAGNOSIS — Z825 Family history of asthma and other chronic lower respiratory diseases: Secondary | ICD-10-CM | POA: Diagnosis not present

## 2015-04-11 DIAGNOSIS — E860 Dehydration: Secondary | ICD-10-CM | POA: Diagnosis present

## 2015-04-11 DIAGNOSIS — H269 Unspecified cataract: Secondary | ICD-10-CM | POA: Diagnosis present

## 2015-04-11 DIAGNOSIS — Z9981 Dependence on supplemental oxygen: Secondary | ICD-10-CM | POA: Diagnosis not present

## 2015-04-11 DIAGNOSIS — Z8619 Personal history of other infectious and parasitic diseases: Secondary | ICD-10-CM | POA: Diagnosis not present

## 2015-04-11 LAB — BASIC METABOLIC PANEL
Anion gap: 12 (ref 5–15)
BUN: 10 mg/dL (ref 6–20)
CO2: 31 mmol/L (ref 22–32)
CREATININE: 0.87 mg/dL (ref 0.61–1.24)
Calcium: 9 mg/dL (ref 8.9–10.3)
Chloride: 98 mmol/L — ABNORMAL LOW (ref 101–111)
GFR calc Af Amer: >60 mL/min (ref >60)
GFR calc non Af Amer: >60 mL/min (ref >60)
Glucose, Bld: 104 mg/dL — ABNORMAL HIGH (ref 65–99)
Potassium: 3.5 mmol/L (ref 3.5–5.1)
Sodium: 141 mmol/L (ref 135–145)

## 2015-04-11 LAB — INFLUENZA PANEL BY PCR (TYPE A & B)
H1N1FLUPCR: NOT DETECTED
INFLAPCR: NEGATIVE
INFLBPCR: NEGATIVE

## 2015-04-11 LAB — CBC WITH DIFFERENTIAL/PLATELET
BASOS ABS: 0 10*3/uL (ref 0.0–0.1)
BASOS PCT: 0 % (ref 0–1)
EOS PCT: 0 % (ref 0–5)
Eosinophils Absolute: 0 10*3/uL (ref 0.0–0.7)
HEMATOCRIT: 33.8 % — AB (ref 39.0–52.0)
HEMOGLOBIN: 11.2 g/dL — AB (ref 13.0–17.0)
Lymphocytes Relative: 10 % — ABNORMAL LOW (ref 12–46)
Lymphs Abs: 1.2 10*3/uL (ref 0.7–4.0)
MCH: 27.3 pg (ref 26.0–34.0)
MCHC: 33.1 g/dL (ref 30.0–36.0)
MCV: 82.2 fL (ref 78.0–100.0)
MONO ABS: 1 10*3/uL (ref 0.1–1.0)
Monocytes Relative: 8 % (ref 3–12)
Neutro Abs: 9.6 10*3/uL — ABNORMAL HIGH (ref 1.7–7.7)
Neutrophils Relative %: 82 % — ABNORMAL HIGH (ref 43–77)
PLATELETS: 191 10*3/uL (ref 150–400)
RBC: 4.11 MIL/uL — ABNORMAL LOW (ref 4.22–5.81)
RDW: 14.5 % (ref 11.5–15.5)
WBC: 11.8 10*3/uL — ABNORMAL HIGH (ref 4.0–10.5)

## 2015-04-11 LAB — PROTIME-INR
INR: 1.31 (ref 0.00–1.49)
Prothrombin Time: 16.4 seconds — ABNORMAL HIGH (ref 11.6–15.2)

## 2015-04-11 LAB — BRAIN NATRIURETIC PEPTIDE: B NATRIURETIC PEPTIDE 5: 29.7 pg/mL (ref 0.0–100.0)

## 2015-04-11 LAB — LACTIC ACID, PLASMA: Lactic Acid, Venous: 1.4 mmol/L (ref 0.5–2.0)

## 2015-04-11 LAB — I-STAT TROPONIN, ED: TROPONIN I, POC: 0 ng/mL (ref 0.00–0.08)

## 2015-04-11 LAB — APTT: aPTT: 38 seconds — ABNORMAL HIGH (ref 24–37)

## 2015-04-11 LAB — PROCALCITONIN: PROCALCITONIN: 0.14 ng/mL

## 2015-04-11 LAB — STREP PNEUMONIAE URINARY ANTIGEN: Strep Pneumo Urinary Antigen: POSITIVE — AB

## 2015-04-11 MED ORDER — GUAIFENESIN ER 600 MG PO TB12
600.0000 mg | ORAL_TABLET | Freq: Two times a day (BID) | ORAL | Status: DC
  Administered 2015-04-11 – 2015-04-12 (×3): 600 mg via ORAL
  Filled 2015-04-11 (×4): qty 1

## 2015-04-11 MED ORDER — MOMETASONE FURO-FORMOTEROL FUM 200-5 MCG/ACT IN AERO
2.0000 | INHALATION_SPRAY | Freq: Two times a day (BID) | RESPIRATORY_TRACT | Status: DC
  Administered 2015-04-11: 2 via RESPIRATORY_TRACT
  Filled 2015-04-11: qty 8.8

## 2015-04-11 MED ORDER — ONDANSETRON HCL 4 MG/2ML IJ SOLN: 4.0000 mg | Freq: Four times a day (QID) | INTRAMUSCULAR | Status: DC | PRN

## 2015-04-11 MED ORDER — ALBUTEROL SULFATE (2.5 MG/3ML) 0.083% IN NEBU: 2.5000 mg | INHALATION_SOLUTION | RESPIRATORY_TRACT | Status: DC | PRN

## 2015-04-11 MED ORDER — BUDESONIDE 0.25 MG/2ML IN SUSP
0.2500 mg | Freq: Two times a day (BID) | RESPIRATORY_TRACT | Status: DC
  Administered 2015-04-11 – 2015-04-12 (×3): 0.25 mg via RESPIRATORY_TRACT
  Filled 2015-04-11 (×6): qty 2

## 2015-04-11 MED ORDER — SODIUM CHLORIDE 0.9 % IV BOLUS (SEPSIS): 1000.0000 mL | Freq: Once | INTRAVENOUS | Status: DC

## 2015-04-11 MED ORDER — IPRATROPIUM-ALBUTEROL 0.5-2.5 (3) MG/3ML IN SOLN
3.0000 mL | RESPIRATORY_TRACT | Status: DC
  Administered 2015-04-11 (×4): 3 mL via RESPIRATORY_TRACT
  Filled 2015-04-11 (×4): qty 3

## 2015-04-11 MED ORDER — DEXTROSE 5 % IV SOLN
500.0000 mg | Freq: Once | INTRAVENOUS | Status: AC
  Administered 2015-04-11: 500 mg via INTRAVENOUS
  Filled 2015-04-11: qty 500

## 2015-04-11 MED ORDER — HYDROCODONE-HOMATROPINE 5-1.5 MG/5ML PO SYRP
2.5000 mL | ORAL_SOLUTION | Freq: Every evening | ORAL | Status: DC | PRN
  Administered 2015-04-11: 2.5 mL via ORAL
  Filled 2015-04-11: qty 5

## 2015-04-11 MED ORDER — IPRATROPIUM-ALBUTEROL 0.5-2.5 (3) MG/3ML IN SOLN
3.0000 mL | Freq: Three times a day (TID) | RESPIRATORY_TRACT | Status: DC
  Administered 2015-04-12: 3 mL via RESPIRATORY_TRACT
  Filled 2015-04-11: qty 3

## 2015-04-11 MED ORDER — ACETAMINOPHEN 650 MG RE SUPP: 650.0000 mg | Freq: Four times a day (QID) | RECTAL | Status: DC | PRN

## 2015-04-11 MED ORDER — IOHEXOL 350 MG/ML SOLN
80.0000 mL | Freq: Once | INTRAVENOUS | Status: AC | PRN
  Administered 2015-04-11: 80 mL via INTRAVENOUS

## 2015-04-11 MED ORDER — ONDANSETRON HCL 4 MG PO TABS: 4.0000 mg | ORAL_TABLET | Freq: Four times a day (QID) | ORAL | Status: DC | PRN

## 2015-04-11 MED ORDER — ENOXAPARIN SODIUM 40 MG/0.4ML ~~LOC~~ SOLN
40.0000 mg | SUBCUTANEOUS | Status: DC
  Administered 2015-04-11: 40 mg via SUBCUTANEOUS
  Filled 2015-04-11 (×2): qty 0.4

## 2015-04-11 MED ORDER — SODIUM CHLORIDE 0.9 % IJ SOLN
3.0000 mL | Freq: Two times a day (BID) | INTRAMUSCULAR | Status: DC
  Administered 2015-04-11 – 2015-04-12 (×2): 3 mL via INTRAVENOUS

## 2015-04-11 MED ORDER — SODIUM CHLORIDE 0.9 % IV SOLN
INTRAVENOUS | Status: DC
  Administered 2015-04-11: 05:00:00 via INTRAVENOUS

## 2015-04-11 MED ORDER — ACETAMINOPHEN 325 MG PO TABS: 650.0000 mg | ORAL_TABLET | Freq: Four times a day (QID) | ORAL | Status: DC | PRN

## 2015-04-11 MED ORDER — FENTANYL CITRATE (PF) 100 MCG/2ML IJ SOLN
50.0000 ug | Freq: Once | INTRAMUSCULAR | Status: AC
  Administered 2015-04-11: 50 ug via INTRAVENOUS
  Filled 2015-04-11: qty 2

## 2015-04-11 MED ORDER — DEXTROSE 5 % IV SOLN
1.0000 g | INTRAVENOUS | Status: DC
  Administered 2015-04-12: 1 g via INTRAVENOUS
  Filled 2015-04-11: qty 10

## 2015-04-11 MED ORDER — DEXTROSE 5 % IV SOLN: 500.0000 mg | INTRAVENOUS | Status: DC

## 2015-04-11 MED ORDER — FLUTICASONE PROPIONATE 50 MCG/ACT NA SUSP
2.0000 | Freq: Every day | NASAL | Status: DC
  Administered 2015-04-11 – 2015-04-12 (×2): 2 via NASAL
  Filled 2015-04-11: qty 16

## 2015-04-11 MED ORDER — TAMSULOSIN HCL 0.4 MG PO CAPS
0.4000 mg | ORAL_CAPSULE | Freq: Every day | ORAL | Status: DC
  Administered 2015-04-11 – 2015-04-12 (×2): 0.4 mg via ORAL
  Filled 2015-04-11 (×2): qty 1

## 2015-04-11 MED ORDER — DEXTROSE 5 % IV SOLN
500.0000 mg | INTRAVENOUS | Status: DC
  Administered 2015-04-12: 500 mg via INTRAVENOUS
  Filled 2015-04-11: qty 500

## 2015-04-11 MED ORDER — ALUM & MAG HYDROXIDE-SIMETH 200-200-20 MG/5ML PO SUSP: 30.0000 mL | Freq: Four times a day (QID) | ORAL | Status: DC | PRN

## 2015-04-11 MED ORDER — DEXTROSE 5 % IV SOLN
1.0000 g | Freq: Once | INTRAVENOUS | Status: AC
  Administered 2015-04-11: 1 g via INTRAVENOUS
  Filled 2015-04-11: qty 10

## 2015-04-11 MED ORDER — DEXTROSE 5 % IV SOLN: 1.0000 g | INTRAVENOUS | Status: DC

## 2015-04-11 MED ORDER — ENSURE HIGH PROTEIN PO LIQD: 1.0000 | Freq: Three times a day (TID) | ORAL | Status: DC

## 2015-04-11 MED ORDER — ENSURE ENLIVE PO LIQD
237.0000 mL | Freq: Three times a day (TID) | ORAL | Status: DC
  Administered 2015-04-11 – 2015-04-12 (×4): 237 mL via ORAL

## 2015-04-11 MED ORDER — ENOXAPARIN SODIUM 30 MG/0.3ML ~~LOC~~ SOLN
30.0000 mg | SUBCUTANEOUS | Status: DC
  Filled 2015-04-11: qty 0.3

## 2015-04-11 NOTE — H&P (Signed)
[facility] History and Physical  Frank Garcia JAS:[id number] DOB: February 05, 1929 DOA: 04/10/2015  Referring physician: ED physician PCP: Brand Males, MD  Specialists:   Chief Complaint: Productive cough, chest pain, shortness of breath  HPI: Frank Garcia is a 81 y.o. 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  Patient reports that he started having chest pain at about 2 PM yesterday. The chest pain is located in left lower chest, constant, moderate, nonradiating. It is pruritic, aggravated by deep breath and coughing. He also has worsening shortness of breath and cough, and he coughs up yellow colored sputum. No fever or chills. Patient does not have tenderness over her calf areas. He does not have abdominal pain, diarrhea, symptoms of UTI, rashes or unilateral weakness.  In ED, patient was found to have WBC 11.8, temperature 99.4, tachycardia, INR 1.21, PTT 38, troponin negative, BNP 29.7, electrolytes okay. CTA of chest showed no PE, but with bibasilar infiltration, 1.7 x 1.4 cm nodule superior to the right hilum. Patient is admitted to inpatient for further evaluation and treatment.  Where does patient live?   At home    Can patient participate in ADLs? Barely  Review of Systems:   General: no fevers, chills, no changes in body weight, has poor appetite, has fatigue HEENT: no blurry vision, hearing changes or sore throat Pulm: has dyspnea, coughing, no wheezing CV: has chest pain, no palpitations Abd: no nausea, vomiting, abdominal pain, diarrhea, constipation GU: no dysuria, burning on urination, increased urinary frequency, hematuria  Ext: no leg edema Neuro: no unilateral weakness, numbness, or tingling, no vision change or hearing loss Skin: no rash MSK: No muscle spasm, no deformity, no limitation of  range of movement in spin Heme: No easy bruising.  Travel history: No recent long distant travel.  Allergy: No Known Allergies  Past Medical History  Diagnosis Date  . BPH (benign prostatic hypertrophy)     takes Rapaflo daily  . Chronic airway obstruction, not elsewhere classified   . Asthma     uses Spiriva and Dulera daily  . Emphysema lung   . Chronic bronchitis   . Shortness of breath     Albuterol inhaler as needed;lying and exertion  . Pneumonia     "a few times" (08/26/2014)  . Cough     productive but without fever  . Arthritis   . Joint pain   . Cataracts, bilateral     immature  . History of shingles   . Cancer   . Hypertension     Past Surgical History  Procedure Laterality Date  . No past surgeries    . Video bronchoscopy with endobronchial navigation N/A 03/10/2015    Procedure: VIDEO BRONCHOSCOPY WITH ENDOBRONCHIAL NAVIGATION and PLACEMENT OF 4MM X 7MM FIDUCIAL MARKERS x3;  Surgeon: Collene Gobble, MD;  Location: [place];  Service: Thoracic;  Laterality: N/A;    Social History:  reports that he has quit smoking. His smoking use included Cigarettes. He has a 60 pack-year smoking history. He quit smokeless tobacco use about 7 years ago. He reports that he does not drink alcohol or use illicit drugs.  Family History:  Family History  Problem Relation Age of Onset  . COPD Father      Prior to Admission medications   Medication Sig Start Date End Date Taking? Authorizing Provider  albuterol (  PROVENTIL HFA;VENTOLIN HFA) 108 (90 BASE) MCG/ACT inhaler Inhale 2 puffs into the lungs every 4 (four) hours as needed for wheezing or shortness of breath. 01/27/14  Yes Brand Males, MD  albuterol (PROVENTIL) (2.5 MG/3ML) 0.083% nebulizer solution Take 3 mLs (2.5 mg total) by nebulization every 2 (two) hours as needed for wheezing or shortness of breath. 01/27/14  Yes Brand Males, MD  fluticasone (FLONASE) 50 MCG/ACT nasal spray Place 2 sprays into both nostrils  daily. 01/11/15  Yes Brand Males, MD  HYDROcodone-homatropine (HYDROMET) 5-1.5 MG/5ML syrup Take 2.5 mLs by mouth at bedtime as needed. 03/12/15  Yes Collene Gobble, MD  mometasone-formoterol (DULERA) 200-5 MCG/ACT AERO Inhale 2 puffs into the lungs 2 (two) times daily. 03/15/15  Yes Brand Males, MD  Nutritional Supplements (ENSURE HIGH PROTEIN PO) Take 1 Can by mouth daily.    Yes Historical Provider, MD  RAPAFLO 8 MG CAPS capsule Take 1 capsule by mouth daily. 03/04/14  Yes Historical Provider, MD  Tiotropium Bromide Monohydrate (SPIRIVA RESPIMAT) 2.5 MCG/ACT AERS Inhale 2 puffs into the lungs daily. 04/09/15  Yes Brand Males, MD    Physical Exam: Filed Vitals:   04/11/15 0330 04/11/15 0345 04/11/15 0400 04/11/15 0415  BP: 112/50 107/51 109/52 110/54  Pulse: 83 82 83 87  Temp:      TempSrc:      Resp: '19 17 18 16  '$ Height:      Weight:      SpO2: 100% 99% 100% 100%   General: Not in acute distress. Cachectic, dry mucous and membrane HEENT:       Eyes: PERRL, EOMI, no scleral icterus.       ENT: No discharge from the ears and nose, no pharynx injection, no tonsillar enlargement.        Neck: No JVD, no bruit, no mass felt. Heme: No neck lymph node enlargement. Cardiac: S1/S2, RRR, No murmurs, No gallops or rubs. Pulm: Decreased air movement bilaterally. No rales, wheezing, rhonchi or rubs. Abd: Soft, nondistended, nontender, no rebound pain, no organomegaly, BS present. Ext: No pitting leg edema bilaterally. 2+DP/PT pulse bilaterally. Musculoskeletal: No joint deformities, No joint redness or warmth, no limitation of ROM in spin. Skin: No rashes.  Neuro: Alert, oriented X3, cranial nerves II-XII grossly intact, muscle strength 5/5 in all extremities, sensation to light touch intact.  Psych: Patient is not psychotic, no suicidal or hemocidal ideation.  Labs on Admission:  Basic Metabolic Panel:  Recent Labs Lab 04/11/15 0011  NA 141  K 3.5  CL 98*  CO2 31  GLUCOSE  104*  BUN 10  CREATININE 0.87  CALCIUM 9.0   Liver Function Tests: No results for input(s): AST, ALT, ALKPHOS, BILITOT, PROT, ALBUMIN in the last 168 hours. No results for input(s): LIPASE, AMYLASE in the last 168 hours. No results for input(s): AMMONIA in the last 168 hours. CBC:  Recent Labs Lab 04/11/15 0011  WBC 11.8*  NEUTROABS 9.6*  HGB 11.2*  HCT 33.8*  MCV 82.2  PLT 191   Cardiac Enzymes: No results for input(s): CKTOTAL, CKMB, CKMBINDEX, TROPONINI in the last 168 hours.  BNP (last 3 results)  Recent Labs  08/26/14 1527 04/11/15 0011  BNP 46.7 29.7    ProBNP (last 3 results) No results for input(s): PROBNP in the last 8760 hours.  CBG: No results for input(s): GLUCAP in the last 168 hours.  Radiological Exams on Admission: Ct Angio Chest Pe W/cm &/or Wo Cm  04/11/2015   CLINICAL DATA:  Acute onset of generalized chest pain and severe shortness of breath. Personal history of lung cancer. Initial encounter.  EXAM: CT ANGIOGRAPHY CHEST WITH CONTRAST  TECHNIQUE: Multidetector CT imaging of the chest was performed using the standard protocol during bolus administration of intravenous contrast. Multiplanar CT image reconstructions and MIPs were obtained to evaluate the vascular anatomy.  CONTRAST:  54m OMNIPAQUE IOHEXOL 350 MG/ML SOLN  COMPARISON:  Chest radiograph performed earlier today at 12:13 a.m., and CT of the chest performed 02/04/2015. PET/CT performed 02/15/2015  FINDINGS: There is no evidence of pulmonary embolus.  Patchy bibasilar airspace opacities raise concern for multifocal pneumonia. Minimal peripheral opacity is noted within the superior aspect of the right lower lobe. Postoperative change is seen near the right lung apex. There is an adjacent 1.7 x 1.4 cm nodule superior to the right hilum. There is no evidence of pleural effusion or pneumothorax.  The mediastinum is unremarkable in appearance. Trace pericardial fluid remains within normal limits. No  definite mediastinal lymphadenopathy is seen. The great vessels are grossly unremarkable in appearance. No axillary lymphadenopathy is seen. The thyroid gland is unremarkable in appearance.  A 1.9 cm cyst is noted at the left hepatic lobe. The visualized portions of the liver and spleen are grossly unremarkable in appearance. The visualized portions of the pancreas, adrenal glands and kidneys are within normal limits.  No acute osseous abnormalities are seen. Mild degenerative change is noted along the lower cervical spine.  Review of the MIP images confirms the above findings.  IMPRESSION: 1. No evidence of pulmonary embolus. 2. Patchy bibasilar airspace opacities raise concern for multifocal pneumonia. Minimal peripheral opacity at the superior aspect of the right lower lobe. 3. 1.7 x 1.4 cm nodule superior to the right hilum. This has increased mildly in size from prior studies, and was seen to most likely reflect recurrent malignancy on the prior PET/CT. Underlying known hilar and mediastinal lymphadenopathy is not well characterized on this study. 4. Small hepatic cyst noted.   Electronically Signed   By: JGarald BaldingM.D.   On: 04/11/2015 02:37   Dg Chest Port 1 View  04/11/2015   CLINICAL DATA:  Chest pain and shortness of breath. Right upper lobe lung cancer.  EXAM: PORTABLE CHEST - 1 VIEW  COMPARISON:  One-view chest x-ray 03/10/2015.  PET scan 02/15/2015.  FINDINGS: The heart size is normal. Emphysematous changes are noted. Surgical clips project over the right hemi thorax. Chronic linear atelectasis in the right middle lobe is again seen. No other focal airspace consolidation is evident. The visualized soft tissues and bony thorax are unremarkable.  IMPRESSION: 1. No acute cardiopulmonary disease. 2. Emphysema. 3. Stable linear atelectasis in the right middle lobe. 4. Postoperative changes in the right upper lobe.   Electronically Signed   By: CSan MorelleM.D.   On: 04/11/2015 00:42     EKG: Independently reviewed.  Abnormal findings:  LAD, T-wave inversion in V1 and V2, high R-wave in V1-V2.  Assessment/Plan Principal Problem:   Community acquired pneumonia Active Problems:   COPD, very severe   TOBACCO ABUSE-HISTORY OF   Pulmonary nodules   CAP (community acquired pneumonia)   Protein-calorie malnutrition, severe   Acute on chronic respiratory failure with hypoxemia   Adenocarcinoma of lung, stage 1   BPH (benign prostatic hyperplasia)   Sepsis  Community acquired pneumonia/Acute on chronic respiratory failure with hypoxemia/sepsis: Patient's chest pain, productive cough and CTA findings are consistent with PAN. Patient is septic with leukocytosis, tachypnea and  tachycardia. He is hemodynamically stable.  - Will admit to Telemetry Bed - IV rocephin and Aztreonam.  - Mucinex for cough  - DuoNeb, albuterol Neb prn for SOB - Urine legionella and S. pneumococcal antigen - Follow up blood culture x2, sputum culture, plus Flu pcr - will get Procalcitonin and trend lactic acid level per sepsis protocol - IVF: 1L of NS bolus in ED, followed by 100 mL per hour of NS  COPD, very severe: Not exacerbated, no wheezing or rhonchi on lung auscultation. -DuoNeb, albuterol Neb prn for SOB -Continue to Dulera  Protein-calorie malnutrition, severe -Ensure  BPH: stable - Flomax  Adenocarcinoma of lung, stage 1: Patient was seen by radiation oncologist, Dr. Pablo Ledger on 04/08/15.  Per Dr. Unknown Jim note, pt has been followed for a right upper lobe lung mass since 2012. This was recently noted to increase in size on a CT of the [place] 02/04/2015.  A PET scan on 02/15/2015 showed this area to be hypermetabolic. He underwent bronchoscopy and biopsy on 03/10/2015, which showed non-small cell carcinoma favoring adenocarincioma in the right upper lobe nodule. Planing for radiation therapy, but not started yet. -follow up with dr. Pablo Ledger.  DVT ppx:  SQ Lovenox  Code  Status: Full code Family Communication: Yes, patient's  daughter and niece at bed side Disposition Plan: Admit to inpatient   Date of Service 04/11/2015    Ivor Costa [facility] Pager [phone]  If 7PM-7AM, please contact night-coverage [email or web address] Password [facility] 04/11/2015, 4:41 AM

## 2015-04-11 NOTE — ED Notes (Signed)
Patient transported back to CT scan.

## 2015-04-11 NOTE — Progress Notes (Signed)
Patient arrived from [facility]. Alert and oriented X 4. SR on telemetry with no complaints of pain. Admit with pneumonia diagnosis. No skin break down noted. Patient from home with family. Vital signs stable.

## 2015-04-11 NOTE — ED Notes (Signed)
Admitting Dr. Blaine Hamper Bedside.

## 2015-04-11 NOTE — Evaluation (Signed)
Physical Therapy Evaluation Patient Details Name: Frank Garcia MRN: [id number] DOB: 09/12/1928 Today's Date: 04/11/2015   History of Present Illness  NERO SAWATZKY is a 81 y.o. 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 All education has been completed and the patient has no further questions.   PT is signing off. Thank you for this referral.     Follow Up Recommendations No PT follow up    Equipment Recommendations  None recommended by PT    Recommendations for Other Services       Precautions / Restrictions Precautions Precautions: None      Mobility  Bed Mobility Overal bed mobility: Modified Independent                Transfers Overall transfer level: Modified independent                  Ambulation/Gait Ambulation/Gait assistance: Modified independent (Device/Increase time) Ambulation Distance (Feet): 300 Feet Assistive device: None (pushing dinamap with O2 tank, like home) Gait Pattern/deviations: Step-through pattern     General Gait Details: slow, but steady, no losses of balance  Stairs            Wheelchair Mobility    Modified Rankin (Stroke Patients Only)       Balance                                             Pertinent Vitals/Pain Pain Assessment: No/denies pain    Home Living Family/patient expects to be discharged to:: Private residence Living Arrangements: Children Available Help at Discharge: Family;Available PRN/intermittently Type of Home: House Home Access: Level entry     Home Layout: One level Home Equipment: Other (comment) (Oxygen)      Prior Function Level of Independence: Independent with assistive device(s)         Comments: tells me he uses his O2 tank like a cane     Hand Dominance        Extremity/Trunk Assessment   Upper Extremity Assessment: Defer to OT evaluation           Lower Extremity Assessment: Overall WFL for tasks assessed         Communication   Communication: No difficulties  Cognition Arousal/Alertness: Awake/alert Behavior During Therapy: WFL for tasks assessed/performed Overall Cognitive Status: Within Functional Limits for tasks assessed                      General Comments General comments (skin integrity, edema, etc.): Session conducted on 3LO2; O2 sats remained greater than or equal to 98%    Exercises        Assessment/Plan    PT Assessment Patent does not need any further PT services  PT Diagnosis Generalized weakness   PT Problem List    PT Treatment Interventions     PT Goals (Current goals can be found in the Care Plan section) Acute Rehab PT Goals Patient Stated Goal: hopes to get home soon PT Goal Formulation: All assessment and education complete, DC therapy    Frequency     Barriers to discharge  Co-evaluation               End of Session Equipment Utilized During Treatment: Oxygen Activity Tolerance: Patient tolerated treatment well Patient left: in bed;with call bell/phone within reach;with nursing/sitter in room Nurse Communication: Mobility status         Time: 6244-6950 PT Time Calculation (min) (ACUTE ONLY): 23 min   Charges:   PT Evaluation $Initial PT Evaluation Tier I: 1 Procedure PT Treatments $Gait Training: 8-22 mins   PT G Codes:        Quin Hoop 04/11/2015, 4:56 PM  Roney Marion, [place] Pager [phone] Office [phone]

## 2015-04-11 NOTE — ED Notes (Signed)
Transported in stable condition by EMT , respirations unlabored , iv site intact , no chest pain .

## 2015-04-11 NOTE — Progress Notes (Signed)
Patient seen and examined. Admitted after midnight secondary to increase SOB and productive cough. Work up demonstrated CAP. Admitted for further evaluation and treatment. Please referred to H7P written by Dr. Blaine Hamper for further info/details on admission.   Plan: -continue IV antibiotics -will start flutter valve and pulmicort -continue supportive and follow clinical response  Frank Garcia [phone]

## 2015-04-11 NOTE — ED Notes (Signed)
Attempted to call report

## 2015-04-12 DIAGNOSIS — J9621 Acute and chronic respiratory failure with hypoxia: Secondary | ICD-10-CM

## 2015-04-12 DIAGNOSIS — C3411 Malignant neoplasm of upper lobe, right bronchus or lung: Secondary | ICD-10-CM

## 2015-04-12 DIAGNOSIS — E43 Unspecified severe protein-calorie malnutrition: Secondary | ICD-10-CM

## 2015-04-12 DIAGNOSIS — N400 Enlarged prostate without lower urinary tract symptoms: Secondary | ICD-10-CM

## 2015-04-12 LAB — LEGIONELLA ANTIGEN, URINE

## 2015-04-12 MED ORDER — LEVOFLOXACIN 750 MG PO TABS: 750.0000 mg | ORAL_TABLET | Freq: Every day | ORAL | Status: DC

## 2015-04-12 MED ORDER — ENSURE HIGH PROTEIN PO LIQD: 1.0000 | Freq: Three times a day (TID) | ORAL | Status: AC

## 2015-04-12 MED ORDER — GUAIFENESIN ER 600 MG PO TB12: 600.0000 mg | ORAL_TABLET | Freq: Two times a day (BID) | ORAL | Status: DC

## 2015-04-12 MED ORDER — POLYETHYLENE GLYCOL 3350 17 G PO PACK: 17.0000 g | PACK | Freq: Every day | ORAL | Status: DC

## 2015-04-12 NOTE — Discharge Summary (Signed)
Physician Discharge Summary  KALEV TEMME WEX:[id number] DOB: 01/01/1929 DOA: 04/10/2015  PCP: Brand Males, MD  Admit date: 04/10/2015 Discharge date: 04/12/2015  Time spent: 30 minutes  Recommendations for Outpatient Follow-up:  1. BMET to follow electrolytes and renal function 2. CBC to follow WBC's trend 3. CXR in 3-4 weeks to follow resolution of infiltrates   Discharge Diagnoses:  Acute on chronic resp failure Community acquired pneumonia COPD, severe TOBACCO ABUSE-HISTORY OF (not longer smoking) Protein-calorie malnutrition, severe Adenocarcinoma of lung, stage 1 BPH (benign prostatic hyperplasia)    Discharge Condition: stable and improved. Will discharge home with instructions to follow with PCP in 10 days  Diet recommendation: regular diet   Filed Weights   04/10/15 2339 04/11/15 0520 04/12/15 0613  Weight: 46.72 kg (103 lb) 44.543 kg (98 lb 3.2 oz) 47.219 kg (104 lb 1.6 oz)    History of present illness:  81 y.o. 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  Patient reports that he started having chest discomfort pain at about 2 PM day prior to admission. The chest pain was located in left lower chest, constant, moderate, nonradiating and pleuritic in nature (worse with deep breath and coughing). He also has worsening shortness of breath and endorses the cough was productive of yellow colored sputum. No fever or chills. Patient does not have tenderness over his calf areas. He also denies abdominal pain, diarrhea, symptoms of UTI, rashes or unilateral weakness. CTA neg for PE in ED but positive for CAP.  Hospital Course:  1-acute on chronic resp failure: due to CAP -improved with antibiotics and muse of mucinex -O2 requirements back to baseline -no signs of worsening/exacerbation of COPD -will  continue home inhalers  2-COPD, with chronic resp failure: Not exacerbated, no wheezing on lung auscultation. -continue albuterol, Dulera and spiriva -continue oxygen supplementation   3-dehydration -resolved with IVF's resuscitation -advise to keep himself well hydrated and to improve PO intake  4-Protein-calorie malnutrition, severe -continue Ensure TID  5-BPH: stable and w/o complaints of urinary retention  -Will continue Flomax  6-Adenocarcinoma of lung, stage 1: Patient was seen by radiation oncologist, Dr. Pablo Ledger on 04/08/15. Per Dr. Unknown Jim note, pt has been followed for a right upper lobe lung mass since 2012. This was recently noted to increase in size on a CT of the ches on 02/04/2015. A PET scan on 02/15/2015 showed this area to be hypermetabolic. He underwent bronchoscopy and biopsy on 03/10/2015, which showed non-small cell carcinoma favoring adenocarincioma in the right upper lobe nodule. Planing for radiation therapy, but not started yet. -Will continue follow up with dr. Pablo Ledger.   Procedures:  See below for x-ray reports   Consultations:  None   Discharge Exam: Filed Vitals:   04/12/15 0613  BP: 132/62  Pulse: 79  Temp: 98.4 F (36.9 C)  Resp: 17    General: frail, underweight and chronically ill in appearance. Afebrile, no CP and with improve breathing. Patient reports productive cough spells are also better.  Cardiovascular: S1 and S2, no rubs or gallops Respiratory: no wheezing, improved air movement, scattered rhonchi  Abd: soft, NT, ND, positive BS Neuro: non focal  Discharge Instructions   Discharge Instructions    Discharge instructions    Complete by:  As directed   Take medications as prescribed Keep yourself well hydrated Arrange follow up with your PCP in 10  days          Current Discharge Medication List    START taking these medications   Details  guaiFENesin (MUCINEX) 600 MG 12 hr tablet Take 1 tablet (600 mg total)  by mouth 2 (two) times daily. Qty: 30 tablet, Refills: 0    levofloxacin (LEVAQUIN) 750 MG tablet Take 1 tablet (750 mg total) by mouth daily. Qty: 8 tablet, Refills: 0    polyethylene glycol (MIRALAX / GLYCOLAX) packet Take 17 g by mouth daily. Hold for diarrhea Qty: 28 each, Refills: 0      CONTINUE these medications which have CHANGED   Details  Nutritional Supplements (ENSURE HIGH PROTEIN) LIQD Take 1 Bottle by mouth 3 (three) times daily. To be taken 1 bottle three times a day between meals; not as meal substitution.Otho Darner: 02774 mL, Refills: 3      CONTINUE these medications which have NOT CHANGED   Details  albuterol (PROVENTIL HFA;VENTOLIN HFA) 108 (90 BASE) MCG/ACT inhaler Inhale 2 puffs into the lungs every 4 (four) hours as needed for wheezing or shortness of breath. Qty: 1 Inhaler, Refills: 3    albuterol (PROVENTIL) (2.5 MG/3ML) 0.083% nebulizer solution Take 3 mLs (2.5 mg total) by nebulization every 2 (two) hours as needed for wheezing or shortness of breath. Qty: 540 mL, Refills: 4    fluticasone (FLONASE) 50 MCG/ACT nasal spray Place 2 sprays into both nostrils daily. Qty: 16 g, Refills: 5    HYDROcodone-homatropine (HYDROMET) 5-1.5 MG/5ML syrup Take 2.5 mLs by mouth at bedtime as needed. Qty: 240 mL, Refills: 0    mometasone-formoterol (DULERA) 200-5 MCG/ACT AERO Inhale 2 puffs into the lungs 2 (two) times daily. Qty: 2 Inhaler, Refills: 0    RAPAFLO 8 MG CAPS capsule Take 1 capsule by mouth daily.    Tiotropium Bromide Monohydrate (SPIRIVA RESPIMAT) 2.5 MCG/ACT AERS Inhale 2 puffs into the lungs daily. Qty: 3 Inhaler, Refills: 1       No Known Allergies Follow-up Information    Follow up with RAMASWAMY,MURALI, MD. Schedule an appointment as soon as possible for a visit in 10 days.   Specialty:  Pulmonary Disease   Contact information:   [address] [place] [phone]       The results of significant diagnostics from this  hospitalization (including imaging, microbiology, ancillary and laboratory) are listed below for reference.    Significant Diagnostic Studies: Ct Angio Chest Pe W/cm &/or Wo Cm  04/11/2015   CLINICAL DATA:  Acute onset of generalized chest pain and severe shortness of breath. Personal history of lung cancer. Initial encounter.  EXAM: CT ANGIOGRAPHY CHEST WITH CONTRAST  TECHNIQUE: Multidetector CT imaging of the chest was performed using the standard protocol during bolus administration of intravenous contrast. Multiplanar CT image reconstructions and MIPs were obtained to evaluate the vascular anatomy.  CONTRAST:  75m OMNIPAQUE IOHEXOL 350 MG/ML SOLN  COMPARISON:  Chest radiograph performed earlier today at 12:13 a.m., and CT of the chest performed 02/04/2015. PET/CT performed 02/15/2015  FINDINGS: There is no evidence of pulmonary embolus.  Patchy bibasilar airspace opacities raise concern for multifocal pneumonia. Minimal peripheral opacity is noted within the superior aspect of the right lower lobe. Postoperative change is seen near the right lung apex. There is an adjacent 1.7 x 1.4 cm nodule superior to the right hilum. There is no evidence of pleural effusion or pneumothorax.  The mediastinum is unremarkable in appearance. Trace pericardial fluid remains within normal limits. No definite mediastinal  lymphadenopathy is seen. The great vessels are grossly unremarkable in appearance. No axillary lymphadenopathy is seen. The thyroid gland is unremarkable in appearance.  A 1.9 cm cyst is noted at the left hepatic lobe. The visualized portions of the liver and spleen are grossly unremarkable in appearance. The visualized portions of the pancreas, adrenal glands and kidneys are within normal limits.  No acute osseous abnormalities are seen. Mild degenerative change is noted along the lower cervical spine.  Review of the MIP images confirms the above findings.  IMPRESSION: 1. No evidence of pulmonary embolus. 2.  Patchy bibasilar airspace opacities raise concern for multifocal pneumonia. Minimal peripheral opacity at the superior aspect of the right lower lobe. 3. 1.7 x 1.4 cm nodule superior to the right hilum. This has increased mildly in size from prior studies, and was seen to most likely reflect recurrent malignancy on the prior PET/CT. Underlying known hilar and mediastinal lymphadenopathy is not well characterized on this study. 4. Small hepatic cyst noted.   Electronically Signed   By: Garald Balding M.D.   On: 04/11/2015 02:37   Dg Chest Port 1 View  04/11/2015   CLINICAL DATA:  Chest pain and shortness of breath. Right upper lobe lung cancer.  EXAM: PORTABLE CHEST - 1 VIEW  COMPARISON:  One-view chest x-ray 03/10/2015.  PET scan 02/15/2015.  FINDINGS: The heart size is normal. Emphysematous changes are noted. Surgical clips project over the right hemi thorax. Chronic linear atelectasis in the right middle lobe is again seen. No other focal airspace consolidation is evident. The visualized soft tissues and bony thorax are unremarkable.  IMPRESSION: 1. No acute cardiopulmonary disease. 2. Emphysema. 3. Stable linear atelectasis in the right middle lobe. 4. Postoperative changes in the right upper lobe.   Electronically Signed   By: San Morelle M.D.   On: 04/11/2015 00:42   Labs: Basic Metabolic Panel:  Recent Labs Lab 04/11/15 0011  NA 141  K 3.5  CL 98*  CO2 31  GLUCOSE 104*  BUN 10  CREATININE 0.87  CALCIUM 9.0   CBC:  Recent Labs Lab 04/11/15 0011  WBC 11.8*  NEUTROABS 9.6*  HGB 11.2*  HCT 33.8*  MCV 82.2  PLT 191   BNP (last 3 results)  Recent Labs  08/26/14 1527 04/11/15 0011  BNP 46.7 29.7    Signed:  Barton Dubois  [facility] 04/12/2015, 10:15 AM

## 2015-04-12 NOTE — Progress Notes (Signed)
UR completed 

## 2015-04-12 NOTE — Progress Notes (Signed)
Pt discharged to home.Left with daughter. Medication regimen understood.

## 2015-04-16 ENCOUNTER — Other Ambulatory Visit: Payer: Self-pay | Admitting: Radiation Oncology

## 2015-04-16 ENCOUNTER — Ambulatory Visit (HOSPITAL_COMMUNITY)
Admission: RE | Admit: 2015-04-16 | Discharge: 2015-04-16 | Disposition: A | Discharge status: Home / Self Care | Payer: Medicare Other | Source: Ambulatory Visit | Attending: Radiation Oncology | Admitting: Radiation Oncology

## 2015-04-16 DIAGNOSIS — K5730 Diverticulosis of large intestine without perforation or abscess without bleeding: Secondary | ICD-10-CM | POA: Diagnosis not present

## 2015-04-16 DIAGNOSIS — Z79899 Other long term (current) drug therapy: Secondary | ICD-10-CM | POA: Insufficient documentation

## 2015-04-16 DIAGNOSIS — K8020 Calculus of gallbladder without cholecystitis without obstruction: Secondary | ICD-10-CM | POA: Diagnosis not present

## 2015-04-16 DIAGNOSIS — J439 Emphysema, unspecified: Secondary | ICD-10-CM | POA: Insufficient documentation

## 2015-04-16 DIAGNOSIS — C3491 Malignant neoplasm of unspecified part of right bronchus or lung: Secondary | ICD-10-CM

## 2015-04-16 DIAGNOSIS — R590 Localized enlarged lymph nodes: Secondary | ICD-10-CM | POA: Insufficient documentation

## 2015-04-16 LAB — CULTURE, BLOOD (ROUTINE X 2)
CULTURE: NO GROWTH
Culture: NO GROWTH

## 2015-04-16 LAB — GLUCOSE, CAPILLARY: Glucose-Capillary: 64 mg/dL — ABNORMAL LOW (ref 65–99)

## 2015-04-16 MED ORDER — FLUDEOXYGLUCOSE F - 18 (FDG) INJECTION
5.7000 | Freq: Once | INTRAVENOUS | Status: DC | PRN
  Administered 2015-04-16: 5.7 via INTRAVENOUS
  Filled 2015-04-16: qty 5.7

## 2015-04-21 ENCOUNTER — Telehealth: Payer: Self-pay | Admitting: *Deleted

## 2015-04-21 ENCOUNTER — Ambulatory Visit: Payer: Medicare Other | Admitting: Radiation Oncology

## 2015-04-21 ENCOUNTER — Ambulatory Visit
Admission: RE | Admit: 2015-04-21 | Discharge: 2015-04-21 | Disposition: A | Discharge status: Home / Self Care | Payer: Medicare Other | Source: Ambulatory Visit | Attending: Radiation Oncology | Admitting: Radiation Oncology

## 2015-04-21 DIAGNOSIS — F17210 Nicotine dependence, cigarettes, uncomplicated: Secondary | ICD-10-CM | POA: Diagnosis not present

## 2015-04-21 DIAGNOSIS — C3491 Malignant neoplasm of unspecified part of right bronchus or lung: Secondary | ICD-10-CM

## 2015-04-21 DIAGNOSIS — Z510 Encounter for antineoplastic radiation therapy: Secondary | ICD-10-CM | POA: Diagnosis present

## 2015-04-21 DIAGNOSIS — C3411 Malignant neoplasm of upper lobe, right bronchus or lung: Secondary | ICD-10-CM | POA: Diagnosis present

## 2015-04-21 NOTE — Telephone Encounter (Signed)
Oncology Nurse Navigator Documentation  Oncology Nurse Navigator Flowsheets 04/21/2015  Navigator Encounter Type Telephone/Daughter called and left me a vm message to call.  I called back.  I have scheduled patient to be seen on 04/22/15 arrive at 1:30.  She verbalized understanding of appt time and place.   Interventions Coordination of Care  Coordination of Care MD Appointments  Time Spent with Patient 15

## 2015-04-21 NOTE — Telephone Encounter (Signed)
Oncology Nurse Navigator Documentation  Oncology Nurse Navigator Flowsheets 04/21/2015  Navigator Encounter Type Telephone/Dr. Julien Nordmann asked that I schedule patient to be seen at thoracic clinic tomorrow.  I called and left vm message to call with appt.   Interventions Coordination of Care  Coordination of Care MD Appointments  Time Spent with Patient 15

## 2015-04-21 NOTE — Progress Notes (Signed)
[facility] Simulation and Treatment Planning Note   Name: Frank Garcia MRN: [id number]  Date: 04/21/2015  DOB: 12/14/1928  Status: outpatient    DIAGNOSIS: Stage III NSCLC    CONSENT VERIFIED: yes   SET UP AND IMMOBILIZATION: Patient is setup supine with arms in a wing board.   NARRATIVE: The patient was brought to the [facility].  Identity was confirmed.  All relevant records and images related to the planned course of therapy were reviewed.  Then, the patient was positioned in a stable reproducible clinical set-up for radiation therapy.  CT images were obtained.  Skin markings were placed.  The CT images were loaded into the planning software where the target and avoidance structures were contoured.  The radiation prescription was entered and confirmed.   TREATMENT PLANNING NOTE:  Treatment planning then occurred. I have requested 3D simulation with [facility] of the spinal cord, total lungs and gross tumor volume. I have also requested mlcs and an isodose plan.   Special treatment procedure will be performed as Diamantina Monks will be receiving concurrent chemotherapy.   I have ordered a consult with the dietician for monitoring.  I will also be verifying that weekly lab values are appropriate.    ------------------------------------------------  Thea Silversmith, MD  This document serves as a record of services personally performed by Thea Silversmith, MD. It was created on her behalf by Derek Mound, a trained medical scribe. The creation of this record is based on the scribe's personal observations and the provider's statements to them. This document has been checked and approved by the attending provider.

## 2015-04-22 ENCOUNTER — Telehealth: Payer: Self-pay | Admitting: *Deleted

## 2015-04-22 ENCOUNTER — Encounter: Payer: Self-pay | Admitting: Internal Medicine

## 2015-04-22 ENCOUNTER — Telehealth: Payer: Self-pay | Admitting: Internal Medicine

## 2015-04-22 ENCOUNTER — Ambulatory Visit (HOSPITAL_BASED_OUTPATIENT_CLINIC_OR_DEPARTMENT_OTHER): Payer: Medicare Other | Admitting: Internal Medicine

## 2015-04-22 VITALS — BP 131/68 | HR 81 | Temp 98.2°F | Resp 16 | Ht 67.0 in | Wt 99.6 lb

## 2015-04-22 DIAGNOSIS — E46 Unspecified protein-calorie malnutrition: Secondary | ICD-10-CM

## 2015-04-22 DIAGNOSIS — C3411 Malignant neoplasm of upper lobe, right bronchus or lung: Secondary | ICD-10-CM

## 2015-04-22 DIAGNOSIS — C773 Secondary and unspecified malignant neoplasm of axilla and upper limb lymph nodes: Secondary | ICD-10-CM | POA: Diagnosis not present

## 2015-04-22 DIAGNOSIS — E43 Unspecified severe protein-calorie malnutrition: Secondary | ICD-10-CM

## 2015-04-22 NOTE — Telephone Encounter (Signed)
s.w. pt dtr and confirmed appt...ok and aware °

## 2015-04-22 NOTE — Telephone Encounter (Signed)
Per staff message and POF I have scheduled appts. Advised scheduler of appts. JMW  

## 2015-04-22 NOTE — Progress Notes (Signed)
[place] Telephone:([phone]) [phone]   Fax:(336) [phone] Multidisciplinary thoracic oncology clinic  CONSULT NOTE  REFERRING PHYSICIAN:Dr. Thea Silversmith  REASON FOR CONSULTATION:  81 years old African-American male recently diagnosed with lung cancer  HPI Frank Garcia is a 81 y.o. male with past medical history significant for multiple medical problems including history of COPD, coronary artery disease, pneumonia, malnutrition as well as urinary retention and long history of smoking but quit in 2009. The patient was followed by Dr. Chase Caller for his history of COPD. He was noted on previous imaging studies to have right upper lobe pulmonary nodule since 2012. This has been monitored closely but on CT scan of the chest on 06/09/2016there was increase in the size of 0.8 x 0.9 x 1.5 cm nodule in the right lower lobe concerning for potential neoplasm.a PET scan was performed on 02/15/2015 and it showed hypermetabolic right upper lobe pulmonary nodule suggesting primary lung neoplasm. There was also metabolically active right hilar and subcarinal lymph nodes.on 03/10/2015 the patient underwent bronchoscopy with electromagnetic navigational bronchoscopy under the care of Dr. Lamonte Sakai. The final pathology (Accession: [id number]) showed malignant cells consistent with non-small cell carcinoma favoring adenocarcinoma. The patient was seen recently by Dr. Pablo Ledger for consideration of stereotactic radiotherapy to the solitary right lower lobe pulmonary nodule but repeat PET scan performed on 08/19/2016showed increased size and hypermetabolic activity of the 1.3 cm pulmonary nodule in the central right upper lobe highly suspicious for primary bronchogenic carcinoma. There was also increased hypermetabolic activity associated with mild right hilar lymphadenopathy and no significant change in the mild hypermetabolic subcarinal lymphadenopathy. There was no evidence of metastatic disease within  the neck, abdomen or pelvis. Dr. Pablo Ledger kindly referred the patient to me today for evaluation and consideration of concurrent chemotherapy with the convention radiotherapy following the recent finding on the PET scan. The patient is feeling fine today with no specific complaints except for pain in the left shoulder blades as well as shortness breath with exertion and he is currently on home oxygen. He has cough productive of yellowish sputum and he was recently treated for pneumonia. He denied having any significant hemoptysis. The patient lost around 6 pounds in the last 3 months. He denied having any headache or visual changes. He has lack of appetite. Family history significant for father with COPD, mother with congenital heart disease, son with pancreatic cancer and daughter with colon cancer. The patient is married and has 4 children. He was accompanied by his daughter Frank Garcia. He has a history of smoking 1 pack per day for around 50 years but quit in 2009. He also has a history of alcohol abuse in the past but not recently. He has no history of drug abuse.  HPI  Past Medical History  Diagnosis Date  . BPH (benign prostatic hypertrophy)     takes Rapaflo daily  . Chronic airway obstruction, not elsewhere classified   . Asthma     uses Spiriva and Dulera daily  . Emphysema lung   . Chronic bronchitis   . Shortness of breath     Albuterol inhaler as needed;lying and exertion  . Pneumonia     "a few times" (08/26/2014)  . Cough     productive but without fever  . Arthritis   . Joint pain   . Cataracts, bilateral     immature  . History of shingles   . Cancer   . Hypertension     Past Surgical History  Procedure Laterality Date  . No past surgeries    . Video bronchoscopy with endobronchial navigation N/A 03/10/2015    Procedure: VIDEO BRONCHOSCOPY WITH ENDOBRONCHIAL NAVIGATION and PLACEMENT OF 4MM X 7MM FIDUCIAL MARKERS x3;  Surgeon: Collene Gobble, MD;  Location: [facility] OR;   Service: Thoracic;  Laterality: N/A;    Family History  Problem Relation Age of Onset  . COPD Father     Social History Social History  Substance Use Topics  . Smoking status: Former Smoker -- 1.50 packs/day for 45 years    Types: Cigarettes    Quit date: 04/07/2008  . Smokeless tobacco: Former Systems developer    Quit date: 04/07/2008     Comment: quit smoking 3+yrs ago  . Alcohol Use: No     Comment: social drinker prior to sickness    No Known Allergies  Current Outpatient Prescriptions  Medication Sig Dispense Refill  . albuterol (PROVENTIL HFA;VENTOLIN HFA) 108 (90 BASE) MCG/ACT inhaler Inhale 2 puffs into the lungs every 4 (four) hours as needed for wheezing or shortness of breath. 1 Inhaler 3  . albuterol (PROVENTIL) (2.5 MG/3ML) 0.083% nebulizer solution Take 3 mLs (2.5 mg total) by nebulization every 2 (two) hours as needed for wheezing or shortness of breath. 540 mL 4  . fluticasone (FLONASE) 50 MCG/ACT nasal spray Place 2 sprays into both nostrils daily. 16 g 5  . guaiFENesin (MUCINEX) 600 MG 12 hr tablet Take 1 tablet (600 mg total) by mouth 2 (two) times daily. 30 tablet 0  . HYDROcodone-homatropine (HYDROMET) 5-1.5 MG/5ML syrup Take 2.5 mLs by mouth at bedtime as needed. 240 mL 0  . levofloxacin (LEVAQUIN) 750 MG tablet Take 1 tablet (750 mg total) by mouth daily. 8 tablet 0  . mometasone-formoterol (DULERA) 200-5 MCG/ACT AERO Inhale 2 puffs into the lungs 2 (two) times daily. 2 Inhaler 0  . Nutritional Supplements (ENSURE HIGH PROTEIN) LIQD Take 1 Bottle by mouth 3 (three) times daily. To be taken 1 bottle three times a day between meals; not as meal substitution.. 93235 mL 3  . polyethylene glycol (MIRALAX / GLYCOLAX) packet Take 17 g by mouth daily. Hold for diarrhea 28 each 0  . RAPAFLO 8 MG CAPS capsule Take 1 capsule by mouth daily.    . Tiotropium Bromide Monohydrate (SPIRIVA RESPIMAT) 2.5 MCG/ACT AERS Inhale 2 puffs into the lungs daily. 3 Inhaler 1   No current  facility-administered medications for this visit.    Review of Systems  Constitutional: positive for anorexia, fatigue and weight loss Eyes: negative Ears, nose, mouth, throat, and face: negative Respiratory: positive for cough, dyspnea on exertion and sputum Cardiovascular: negative Gastrointestinal: negative Genitourinary:negative Integument/breast: negative Hematologic/lymphatic: negative Musculoskeletal:negative Neurological: negative Behavioral/Psych: negative Endocrine: negative Allergic/Immunologic: negative  Physical Exam  TDD:UKGUR, healthy, no distress and malnourished SKIN: skin color, texture, turgor are normal, no rashes or significant lesions HEAD: Normocephalic, No masses, lesions, tenderness or abnormalities EYES: normal, PERRLA, Conjunctiva are pink and non-injected EARS: External ears normal, Canals clear OROPHARYNX:no exudate, no erythema and lips, buccal mucosa, and tongue normal  NECK: supple, no adenopathy, no JVD LYMPH:  no palpable lymphadenopathy, no hepatosplenomegaly LUNGS: clear to auscultation , and palpation HEART: regular rate & rhythm, no murmurs and no gallops ABDOMEN:abdomen soft, non-tender, normal bowel sounds and no masses or organomegaly BACK: Back symmetric, no curvature., No CVA tenderness EXTREMITIES:no joint deformities, effusion, or inflammation, no edema, no skin discoloration  NEURO: alert & oriented x 3 with fluent speech, no focal motor/sensory deficits  PERFORMANCE STATUS: ECOG 1  LABORATORY DATA: Lab Results  Component Value Date   WBC 11.8* 04/11/2015   HGB 11.2* 04/11/2015   HCT 33.8* 04/11/2015   MCV 82.2 04/11/2015   PLT 191 04/11/2015      Chemistry      Component Value Date/Time   NA 141 04/11/2015 0011   K 3.5 04/11/2015 0011   CL 98* 04/11/2015 0011   CO2 31 04/11/2015 0011   BUN 10 04/11/2015 0011   CREATININE 0.87 04/11/2015 0011      Component Value Date/Time   CALCIUM 9.0 04/11/2015 0011    ALKPHOS 54 08/27/2014 0300   AST 21 08/27/2014 0300   ALT 10 08/27/2014 0300   BILITOT 1.2 08/27/2014 0300       RADIOGRAPHIC STUDIES: Ct Angio Chest Pe W/cm &/or Wo Cm  04/11/2015   CLINICAL DATA:  Acute onset of generalized chest pain and severe shortness of breath. Personal history of lung cancer. Initial encounter.  EXAM: CT ANGIOGRAPHY CHEST WITH CONTRAST  TECHNIQUE: Multidetector CT imaging of the chest was performed using the standard protocol during bolus administration of intravenous contrast. Multiplanar CT image reconstructions and MIPs were obtained to evaluate the vascular anatomy.  CONTRAST:  43m OMNIPAQUE IOHEXOL 350 MG/ML SOLN  COMPARISON:  Chest radiograph performed earlier today at 12:13 a.m., and CT of the chest performed 02/04/2015. PET/CT performed 02/15/2015  FINDINGS: There is no evidence of pulmonary embolus.  Patchy bibasilar airspace opacities raise concern for multifocal pneumonia. Minimal peripheral opacity is noted within the superior aspect of the right lower lobe. Postoperative change is seen near the right lung apex. There is an adjacent 1.7 x 1.4 cm nodule superior to the right hilum. There is no evidence of pleural effusion or pneumothorax.  The mediastinum is unremarkable in appearance. Trace pericardial fluid remains within normal limits. No definite mediastinal lymphadenopathy is seen. The great vessels are grossly unremarkable in appearance. No axillary lymphadenopathy is seen. The thyroid gland is unremarkable in appearance.  A 1.9 cm cyst is noted at the left hepatic lobe. The visualized portions of the liver and spleen are grossly unremarkable in appearance. The visualized portions of the pancreas, adrenal glands and kidneys are within normal limits.  No acute osseous abnormalities are seen. Mild degenerative change is noted along the lower cervical spine.  Review of the MIP images confirms the above findings.  IMPRESSION: 1. No evidence of pulmonary embolus. 2.  Patchy bibasilar airspace opacities raise concern for multifocal pneumonia. Minimal peripheral opacity at the superior aspect of the right lower lobe. 3. 1.7 x 1.4 cm nodule superior to the right hilum. This has increased mildly in size from prior studies, and was seen to most likely reflect recurrent malignancy on the prior PET/CT. Underlying known hilar and mediastinal lymphadenopathy is not well characterized on this study. 4. Small hepatic cyst noted.   Electronically Signed   By: JGarald BaldingM.D.   On: 04/11/2015 02:37   Nm Pet Image Restag (ps) Skull Base To Thigh  04/16/2015   CLINICAL DATA:  Subsequent treatment strategy for lung carcinoma.  EXAM: NUCLEAR MEDICINE PET SKULL BASE TO THIGH  TECHNIQUE: 5.7 mCi F-18 FDG was injected intravenously. Full-ring PET imaging was performed from the skull base to thigh after the radiotracer. CT data was obtained and used for attenuation correction and anatomic localization.  FASTING BLOOD GLUCOSE:  Value: 64 mg/dl  COMPARISON:  PET-CT on 02/15/2015  FINDINGS: NECK  No hypermetabolic lymph nodes in the neck.  CHEST  13 mm pulmonary nodule in the central right upper lobe shows mild increase in size compared to 10 mm previously. This also shows increased metabolic activity, with SUV max currently measuring 11.9 compared to 5.0 previously. This is highly suspicious for primary bronchogenic carcinoma.  Hypermetabolic airspace disease is seen in the inferior aspect of the lingula, and mild patchy hypermetabolic infiltrate is also seen in the right lower lobe. This is suspicious for pneumonia. Moderate to severe emphysema again noted.  Mild right hilar lymphadenopathy shows increased metabolic activity with SUV max of 16.2 compared to 4.4 previously. Mild subcarinal mediastinal lymphadenopathy shows no significant change with SUV max measuring 4.2 compared to 3.3 previously.  ABDOMEN/PELVIS  No abnormal hypermetabolic activity within the liver, pancreas, adrenal glands,  or spleen. No hypermetabolic lymph nodes in the abdomen or pelvis.  Tiny calcified gallstones and left colonic diverticulosis incidentally noted.  SKELETON  No focal hypermetabolic activity to suggest skeletal metastasis.  IMPRESSION: Increased size and hypermetabolic activity of 13 mm pulmonary nodule in the central right upper lobe, highly suspicious for primary bronchogenic carcinoma.  Increased hypermetabolic activity associated with mild right hilar lymphadenopathy. No significant change in mild hypermetabolic subcarinal lymphadenopathy.  Hypermetabolic infiltrate in right lower lobe and lingula, suspicious for pneumonia. Moderate severe pulmonary emphysema.  No evidence of metastatic disease within the neck, abdomen, or pelvis.   Electronically Signed   By: Earle Gell M.D.   On: 04/16/2015 16:22   Dg Chest Port 1 View  04/11/2015   CLINICAL DATA:  Chest pain and shortness of breath. Right upper lobe lung cancer.  EXAM: PORTABLE CHEST - 1 VIEW  COMPARISON:  One-view chest x-ray 03/10/2015.  PET scan 02/15/2015.  FINDINGS: The heart size is normal. Emphysematous changes are noted. Surgical clips project over the right hemi thorax. Chronic linear atelectasis in the right middle lobe is again seen. No other focal airspace consolidation is evident. The visualized soft tissues and bony thorax are unremarkable.  IMPRESSION: 1. No acute cardiopulmonary disease. 2. Emphysema. 3. Stable linear atelectasis in the right middle lobe. 4. Postoperative changes in the right upper lobe.   Electronically Signed   By: San Morelle M.D.   On: 04/11/2015 00:42    ASSESSMENT:This is a very pleasant 81 years old white male recently diagnosed with a stage IIA (T1a, N1, M0) non-small cell lung cancer favoring adenocarcinoma diagnosed in July 2016, presenting with right upper lobe nodule and right hilar lymphadenopathy.  PLAN:I had a lengthy discussion with the patient and his daughter today about his current disease  stage, prognosis and treatment options. The patient is not a good candidate for surgical resection because of his poor pulmonary function and he is currently on home oxygen. I recommended for the patient a course of concurrent chemoradiation with weekly carboplatin for AUC of 2 and paclitaxel 45 MG/M2 weekly with radiation. I discussed with the patient adverse effect of the chemotherapy including but not limited to alopecia, myelosuppression, nausea and vomiting, peripheral neuropathy, liver or renal dysfunction. I will arrange for the patient to have a chemotherapy education class before starting the first dose of his chemotherapy. He is expected to start the first cycle of this treatment next week. I will call his pharmacy with prescription for Compazine 10 mg by mouth every 6 hours as needed for nausea. I will complete the staging workup by ordering a MRI of the brain to rule out brain metastasis. The patient would come back for follow-up visit in 2  weeks for reevaluation and management of any adverse effect of his treatment. He was advised to call immediately if he has any concerning symptoms in the interval. The patient voices understanding of current disease status and treatment options and is in agreement with the current care plan.  All questions were answered. The patient knows to call the clinic with any problems, questions or concerns. We can certainly see the patient much sooner if necessary.  Thank you so much for allowing me to participate in the care of Frank Garcia. I will continue to follow up the patient with you and assist in his care.  I spent 55 minutes counseling the patient face to face. The total time spent in the appointment was 80 minutes.  Disclaimer: This note was dictated with voice recognition software. Similar sounding words can inadvertently be transcribed and may not be corrected upon review.   Kaveh Kissinger K. April 22, 2015, 2:25 PM

## 2015-04-22 NOTE — Telephone Encounter (Signed)
Gave adn printed appt sched and avs for pt Aug thru [place]...emaield maggie for approval

## 2015-04-23 ENCOUNTER — Inpatient Hospital Stay: Payer: Medicare Other | Admitting: Adult Health

## 2015-04-26 ENCOUNTER — Other Ambulatory Visit: Payer: Medicare Other

## 2015-04-26 ENCOUNTER — Ambulatory Visit: Payer: Medicare Other | Admitting: Nutrition

## 2015-04-26 ENCOUNTER — Telehealth: Payer: Self-pay | Admitting: *Deleted

## 2015-04-26 ENCOUNTER — Other Ambulatory Visit: Payer: Self-pay | Admitting: Medical Oncology

## 2015-04-26 MED ORDER — PROCHLORPERAZINE MALEATE 10 MG PO TABS: 10.0000 mg | ORAL_TABLET | Freq: Four times a day (QID) | ORAL | Status: DC | PRN

## 2015-04-26 NOTE — Telephone Encounter (Signed)
CALLED TO [facility] ON [address]. NOTIFIED DR.MOHAMED'S NURSE, DIANE BELL,RN.

## 2015-04-26 NOTE — Progress Notes (Signed)
81 year old male diagnosed with lung cancer.  He is a patient of Dr. Earlie Server and Dr. Pablo Ledger.  Past medical history includes asthma, shingles, hypertension, and emphysema.  Medications include MiraLAX.  Labs were reviewed.  Height: 5 feet 7 inches. Weight: 99.6 pounds August 25. Usual body weight: 101 pounds.  Patient reports he weighed 140 pounds 6 years ago. BMI: 15.6.  Patient reports he has a poor appetite. He denies nausea, vomiting, constipation or chewing and swallowing problems. He consumes 2-3 boost high protein nutrition drinks daily. States 40 pound weight loss occurred after diagnosis of emphysema. Patient currently forcing himself to eat. Typically eats 3 meals daily.  Nutrition diagnosis: Increased nutrient needs related to diagnosis of lung cancer and associated treatments as evidenced by BMI of 15.6.  Intervention: Educated patient to continue 3 meals daily with increased calories and protein. Recommended patient change boost high protein to boost +4 times daily. Reviewed scheduled meals and snacks with patient and daughter. Encouraged patient to try to eat every 2-3 hours.  I provided a fact sheet on poor appetite.  Also provided fact sheet on increasing calories and protein. Provided one complementary case of Ensure Plus along with coupons for patient to purchase. Questions were answered.  Teach back method used.  Monitoring, evaluation, goals: Patient will tolerate increased calories and protein to promote weight gain.  Next visit: Tuesday, September 20, during chemotherapy.  **Disclaimer: This note was dictated with voice recognition software. Similar sounding words can inadvertently be transcribed and this note may contain transcription errors which may not have been corrected upon publication of note.**

## 2015-04-27 ENCOUNTER — Ambulatory Visit (HOSPITAL_BASED_OUTPATIENT_CLINIC_OR_DEPARTMENT_OTHER): Payer: Medicare Other

## 2015-04-27 ENCOUNTER — Ambulatory Visit (HOSPITAL_COMMUNITY): Payer: Medicare Other

## 2015-04-27 ENCOUNTER — Ambulatory Visit (HOSPITAL_COMMUNITY)
Admission: RE | Admit: 2015-04-27 | Discharge: 2015-04-27 | Disposition: A | Discharge status: Home / Self Care | Payer: Medicare Other | Source: Ambulatory Visit | Attending: Internal Medicine | Admitting: Internal Medicine

## 2015-04-27 ENCOUNTER — Other Ambulatory Visit (HOSPITAL_BASED_OUTPATIENT_CLINIC_OR_DEPARTMENT_OTHER): Payer: Medicare Other

## 2015-04-27 ENCOUNTER — Inpatient Hospital Stay: Payer: Medicare Other | Admitting: Adult Health

## 2015-04-27 VITALS — BP 125/63 | HR 69 | Temp 98.3°F | Resp 20

## 2015-04-27 DIAGNOSIS — E43 Unspecified severe protein-calorie malnutrition: Secondary | ICD-10-CM | POA: Diagnosis present

## 2015-04-27 DIAGNOSIS — M4802 Spinal stenosis, cervical region: Secondary | ICD-10-CM | POA: Insufficient documentation

## 2015-04-27 DIAGNOSIS — C3411 Malignant neoplasm of upper lobe, right bronchus or lung: Secondary | ICD-10-CM

## 2015-04-27 DIAGNOSIS — Z510 Encounter for antineoplastic radiation therapy: Secondary | ICD-10-CM | POA: Diagnosis not present

## 2015-04-27 DIAGNOSIS — C773 Secondary and unspecified malignant neoplasm of axilla and upper limb lymph nodes: Secondary | ICD-10-CM

## 2015-04-27 DIAGNOSIS — C3491 Malignant neoplasm of unspecified part of right bronchus or lung: Secondary | ICD-10-CM

## 2015-04-27 DIAGNOSIS — Z5111 Encounter for antineoplastic chemotherapy: Secondary | ICD-10-CM

## 2015-04-27 DIAGNOSIS — I739 Peripheral vascular disease, unspecified: Secondary | ICD-10-CM | POA: Diagnosis not present

## 2015-04-27 LAB — COMPREHENSIVE METABOLIC PANEL (CC13)
ALT: 13 U/L (ref 0–55)
AST: 22 U/L (ref 5–34)
Albumin: 3.2 g/dL — ABNORMAL LOW (ref 3.5–5.0)
Alkaline Phosphatase: 63 U/L (ref 40–150)
Anion Gap: 6 mEq/L (ref 3–11)
BILIRUBIN TOTAL: 0.58 mg/dL (ref 0.20–1.20)
BUN: 18 mg/dL (ref 7.0–26.0)
CO2: 30 meq/L — AB (ref 22–29)
Calcium: 8.9 mg/dL (ref 8.4–10.4)
Chloride: 106 mEq/L (ref 98–109)
Creatinine: 0.8 mg/dL (ref 0.7–1.3)
GLUCOSE: 98 mg/dL (ref 70–140)
POTASSIUM: 3.8 meq/L (ref 3.5–5.1)
SODIUM: 142 meq/L (ref 136–145)
TOTAL PROTEIN: 6.8 g/dL (ref 6.4–8.3)

## 2015-04-27 LAB — CBC WITH DIFFERENTIAL/PLATELET
BASO%: 1 % (ref 0.0–2.0)
Basophils Absolute: 0.1 10*3/uL (ref 0.0–0.1)
EOS ABS: 0.2 10*3/uL (ref 0.0–0.5)
EOS%: 2.8 % (ref 0.0–7.0)
HCT: 36 % — ABNORMAL LOW (ref 38.4–49.9)
HGB: 11.7 g/dL — ABNORMAL LOW (ref 13.0–17.1)
LYMPH%: 25.8 % (ref 14.0–49.0)
MCH: 26.8 pg — ABNORMAL LOW (ref 27.2–33.4)
MCHC: 32.4 g/dL (ref 32.0–36.0)
MCV: 82.7 fL (ref 79.3–98.0)
MONO#: 0.4 10*3/uL (ref 0.1–0.9)
MONO%: 7.4 % (ref 0.0–14.0)
NEUT%: 63 % (ref 39.0–75.0)
NEUTROS ABS: 3.4 10*3/uL (ref 1.5–6.5)
Platelets: 269 10*3/uL (ref 140–400)
RBC: 4.35 10*6/uL (ref 4.20–5.82)
RDW: 14.7 % — ABNORMAL HIGH (ref 11.0–14.6)
WBC: 5.3 10*3/uL (ref 4.0–10.3)
lymph#: 1.4 10*3/uL (ref 0.9–3.3)

## 2015-04-27 MED ORDER — GADOBENATE DIMEGLUMINE 529 MG/ML IV SOLN
10.0000 mL | Freq: Once | INTRAVENOUS | Status: AC | PRN
  Administered 2015-04-27: 8 mL via INTRAVENOUS

## 2015-04-27 MED ORDER — DIPHENHYDRAMINE HCL 50 MG/ML IJ SOLN
50.0000 mg | Freq: Once | INTRAMUSCULAR | Status: AC
  Administered 2015-04-27: 50 mg via INTRAVENOUS

## 2015-04-27 MED ORDER — FAMOTIDINE IN NACL 20-0.9 MG/50ML-% IV SOLN
20.0000 mg | Freq: Once | INTRAVENOUS | Status: AC
  Administered 2015-04-27: 20 mg via INTRAVENOUS

## 2015-04-27 MED ORDER — HEPARIN SOD (PORK) LOCK FLUSH 100 UNIT/ML IV SOLN
500.0000 [IU] | Freq: Once | INTRAVENOUS | Status: DC | PRN
  Filled 2015-04-27: qty 5

## 2015-04-27 MED ORDER — DIPHENHYDRAMINE HCL 50 MG/ML IJ SOLN
INTRAMUSCULAR | Status: AC
Start: 2015-04-27 — End: 2015-04-27
  Filled 2015-04-27: qty 1

## 2015-04-27 MED ORDER — PACLITAXEL CHEMO INJECTION 300 MG/50ML
45.0000 mg/m2 | Freq: Once | INTRAVENOUS | Status: AC
  Administered 2015-04-27: 66 mg via INTRAVENOUS
  Filled 2015-04-27: qty 11

## 2015-04-27 MED ORDER — FAMOTIDINE IN NACL 20-0.9 MG/50ML-% IV SOLN
INTRAVENOUS | Status: AC
  Filled 2015-04-27: qty 50

## 2015-04-27 MED ORDER — SODIUM CHLORIDE 0.9 % IV SOLN
120.4000 mg | Freq: Once | INTRAVENOUS | Status: AC
  Administered 2015-04-27: 120 mg via INTRAVENOUS
  Filled 2015-04-27: qty 12

## 2015-04-27 MED ORDER — SODIUM CHLORIDE 0.9 % IV SOLN
Freq: Once | INTRAVENOUS | Status: AC
  Administered 2015-04-27: 11:00:00 via INTRAVENOUS
  Filled 2015-04-27: qty 8

## 2015-04-27 MED ORDER — SODIUM CHLORIDE 0.9 % IV SOLN
Freq: Once | INTRAVENOUS | Status: AC
  Administered 2015-04-27: 10:00:00 via INTRAVENOUS

## 2015-04-27 MED ORDER — SODIUM CHLORIDE 0.9 % IJ SOLN
10.0000 mL | INTRAMUSCULAR | Status: DC | PRN
  Filled 2015-04-27: qty 10

## 2015-04-27 NOTE — Patient Instructions (Signed)
[facility] Discharge Instructions for Patients Receiving Chemotherapy  Today you received the following chemotherapy agents: Taxol and Carboplatin   To help prevent nausea and vomiting after your treatment, we encourage you to take your nausea medication as directed.  Compazine '10mg'$  take one tablet every 6 hours as needed.    If you develop nausea and vomiting that is not controlled by your nausea medication, call the clinic.   BELOW ARE SYMPTOMS THAT SHOULD BE REPORTED IMMEDIATELY:  *FEVER GREATER THAN 100.5 F  *CHILLS WITH OR WITHOUT FEVER  NAUSEA AND VOMITING THAT IS NOT CONTROLLED WITH YOUR NAUSEA MEDICATION  *UNUSUAL SHORTNESS OF BREATH  *UNUSUAL BRUISING OR BLEEDING  TENDERNESS IN MOUTH AND THROAT WITH OR WITHOUT PRESENCE OF ULCERS  *URINARY PROBLEMS  *BOWEL PROBLEMS  UNUSUAL RASH Items with * indicate a potential emergency and should be followed up as soon as possible.  Feel free to call the clinic you have any questions or concerns. The clinic phone number is [phone].  Please show the [place] at check-in to the Emergency Department and triage nurse.   Paclitaxel injection What is this medicine? PACLITAXEL (PAK li TAX el) is a chemotherapy drug. It targets fast dividing cells, like cancer cells, and causes these cells to die. This medicine is used to treat ovarian cancer, breast cancer, and other cancers. This medicine may be used for other purposes; ask your health care provider or pharmacist if you have questions. COMMON BRAND NAME(S): Onxol, Taxol What should I tell my health care provider before I take this medicine? They need to know if you have any of these conditions: -blood disorders -irregular heartbeat -infection (especially a virus infection such as chickenpox, cold sores, or herpes) -liver disease -previous or ongoing radiation therapy -an unusual or allergic reaction to paclitaxel, alcohol, polyoxyethylated castor  oil, other chemotherapy agents, other medicines, foods, dyes, or preservatives -pregnant or trying to get pregnant -breast-feeding How should I use this medicine? This drug is given as an infusion into a vein. It is administered in a hospital or clinic by a specially trained health care professional. Talk to your pediatrician regarding the use of this medicine in children. Special care may be needed. Overdosage: If you think you have taken too much of this medicine contact a poison control center or emergency room at once. NOTE: This medicine is only for you. Do not share this medicine with others. What if I miss a dose? It is important not to miss your dose. Call your doctor or health care professional if you are unable to keep an appointment. What may interact with this medicine? Do not take this medicine with any of the following medications: -disulfiram -metronidazole This medicine may also interact with the following medications: -cyclosporine -diazepam -ketoconazole -medicines to increase blood counts like filgrastim, pegfilgrastim, sargramostim -other chemotherapy drugs like cisplatin, doxorubicin, epirubicin, etoposide, teniposide, vincristine -quinidine -testosterone -vaccines -verapamil Talk to your doctor or health care professional before taking any of these medicines: -acetaminophen -aspirin -ibuprofen -ketoprofen -naproxen This list may not describe all possible interactions. Give your health care provider a list of all the medicines, herbs, non-prescription drugs, or dietary supplements you use. Also tell them if you smoke, drink alcohol, or use illegal drugs. Some items may interact with your medicine. What should I watch for while using this medicine? Your condition will be monitored carefully while you are receiving this medicine. You will need important blood work done while you are taking this medicine. This  drug may make you feel generally unwell. This is not  uncommon, as chemotherapy can affect healthy cells as well as cancer cells. Report any side effects. Continue your course of treatment even though you feel ill unless your doctor tells you to stop. In some cases, you may be given additional medicines to help with side effects. Follow all directions for their use. Call your doctor or health care professional for advice if you get a fever, chills or sore throat, or other symptoms of a cold or flu. Do not treat yourself. This drug decreases your body's ability to fight infections. Try to avoid being around people who are sick. This medicine may increase your risk to bruise or bleed. Call your doctor or health care professional if you notice any unusual bleeding. Be careful brushing and flossing your teeth or using a toothpick because you may get an infection or bleed more easily. If you have any dental work done, tell your dentist you are receiving this medicine. Avoid taking products that contain aspirin, acetaminophen, ibuprofen, naproxen, or ketoprofen unless instructed by your doctor. These medicines may hide a fever. Do not become pregnant while taking this medicine. Women should inform their doctor if they wish to become pregnant or think they might be pregnant. There is a potential for serious side effects to an unborn child. Talk to your health care professional or pharmacist for more information. Do not breast-feed an infant while taking this medicine. Men are advised not to father a child while receiving this medicine. What side effects may I notice from receiving this medicine? Side effects that you should report to your doctor or health care professional as soon as possible: -allergic reactions like skin rash, itching or hives, swelling of the face, lips, or tongue -low blood counts - This drug may decrease the number of white blood cells, red blood cells and platelets. You may be at increased risk for infections and bleeding. -signs of  infection - fever or chills, cough, sore throat, pain or difficulty passing urine -signs of decreased platelets or bleeding - bruising, pinpoint red spots on the skin, black, tarry stools, nosebleeds -signs of decreased red blood cells - unusually weak or tired, fainting spells, lightheadedness -breathing problems -chest pain -high or low blood pressure -mouth sores -nausea and vomiting -pain, swelling, redness or irritation at the injection site -pain, tingling, numbness in the hands or feet -slow or irregular heartbeat -swelling of the ankle, feet, hands Side effects that usually do not require medical attention (report to your doctor or health care professional if they continue or are bothersome): -bone pain -complete hair loss including hair on your head, underarms, pubic hair, eyebrows, and eyelashes -changes in the color of fingernails -diarrhea -loosening of the fingernails -loss of appetite -muscle or joint pain -red flush to skin -sweating This list may not describe all possible side effects. Call your doctor for medical advice about side effects. You may report side effects to FDA at 1-800-FDA-1088. Where should I keep my medicine? This drug is given in a hospital or clinic and will not be stored at home. NOTE: This sheet is a summary. It may not cover all possible information. If you have questions about this medicine, talk to your doctor, pharmacist, or health care provider.  2015, [employer]. (2012-10-07 16:41:21)    Carboplatin injection What is this medicine? CARBOPLATIN (KAR boe pla tin) is a chemotherapy drug. It targets fast dividing cells, like cancer cells, and causes these cells to die.  This medicine is used to treat ovarian cancer and many other cancers. This medicine may be used for other purposes; ask your health care provider or pharmacist if you have questions. COMMON BRAND NAME(S): Paraplatin What should I tell my health care provider before  I take this medicine? They need to know if you have any of these conditions: -blood disorders -hearing problems -kidney disease -recent or ongoing radiation therapy -an unusual or allergic reaction to carboplatin, cisplatin, other chemotherapy, other medicines, foods, dyes, or preservatives -pregnant or trying to get pregnant -breast-feeding How should I use this medicine? This drug is usually given as an infusion into a vein. It is administered in a hospital or clinic by a specially trained health care professional. Talk to your pediatrician regarding the use of this medicine in children. Special care may be needed. Overdosage: If you think you have taken too much of this medicine contact a poison control center or emergency room at once. NOTE: This medicine is only for you. Do not share this medicine with others. What if I miss a dose? It is important not to miss a dose. Call your doctor or health care professional if you are unable to keep an appointment. What may interact with this medicine? -medicines for seizures -medicines to increase blood counts like filgrastim, pegfilgrastim, sargramostim -some antibiotics like amikacin, gentamicin, neomycin, streptomycin, tobramycin -vaccines Talk to your doctor or health care professional before taking any of these medicines: -acetaminophen -aspirin -ibuprofen -ketoprofen -naproxen This list may not describe all possible interactions. Give your health care provider a list of all the medicines, herbs, non-prescription drugs, or dietary supplements you use. Also tell them if you smoke, drink alcohol, or use illegal drugs. Some items may interact with your medicine. What should I watch for while using this medicine? Your condition will be monitored carefully while you are receiving this medicine. You will need important blood work done while you are taking this medicine. This drug may make you feel generally unwell. This is not uncommon, as  chemotherapy can affect healthy cells as well as cancer cells. Report any side effects. Continue your course of treatment even though you feel ill unless your doctor tells you to stop. In some cases, you may be given additional medicines to help with side effects. Follow all directions for their use. Call your doctor or health care professional for advice if you get a fever, chills or sore throat, or other symptoms of a cold or flu. Do not treat yourself. This drug decreases your body's ability to fight infections. Try to avoid being around people who are sick. This medicine may increase your risk to bruise or bleed. Call your doctor or health care professional if you notice any unusual bleeding. Be careful brushing and flossing your teeth or using a toothpick because you may get an infection or bleed more easily. If you have any dental work done, tell your dentist you are receiving this medicine. Avoid taking products that contain aspirin, acetaminophen, ibuprofen, naproxen, or ketoprofen unless instructed by your doctor. These medicines may hide a fever. Do not become pregnant while taking this medicine. Women should inform their doctor if they wish to become pregnant or think they might be pregnant. There is a potential for serious side effects to an unborn child. Talk to your health care professional or pharmacist for more information. Do not breast-feed an infant while taking this medicine. What side effects may I notice from receiving this medicine? Side effects that you should  report to your doctor or health care professional as soon as possible: -allergic reactions like skin rash, itching or hives, swelling of the face, lips, or tongue -signs of infection - fever or chills, cough, sore throat, pain or difficulty passing urine -signs of decreased platelets or bleeding - bruising, pinpoint red spots on the skin, black, tarry stools, nosebleeds -signs of decreased red blood cells - unusually weak or  tired, fainting spells, lightheadedness -breathing problems -changes in hearing -changes in vision -chest pain -high blood pressure -low blood counts - This drug may decrease the number of white blood cells, red blood cells and platelets. You may be at increased risk for infections and bleeding. -nausea and vomiting -pain, swelling, redness or irritation at the injection site -pain, tingling, numbness in the hands or feet -problems with balance, talking, walking -trouble passing urine or change in the amount of urine Side effects that usually do not require medical attention (report to your doctor or health care professional if they continue or are bothersome): -hair loss -loss of appetite -metallic taste in the mouth or changes in taste This list may not describe all possible side effects. Call your doctor for medical advice about side effects. You may report side effects to FDA at 1-800-FDA-1088. Where should I keep my medicine? This drug is given in a hospital or clinic and will not be stored at home. NOTE: This sheet is a summary. It may not cover all possible information. If you have questions about this medicine, talk to your doctor, pharmacist, or health care provider.  2015, [employer]. (2007-11-19 14:38:05)

## 2015-04-28 ENCOUNTER — Telehealth: Payer: Self-pay | Admitting: *Deleted

## 2015-04-28 ENCOUNTER — Ambulatory Visit
Admission: RE | Admit: 2015-04-28 | Discharge: 2015-04-28 | Disposition: A | Discharge status: Home / Self Care | Payer: Medicare Other | Source: Ambulatory Visit | Attending: Radiation Oncology | Admitting: Radiation Oncology

## 2015-04-28 ENCOUNTER — Telehealth: Payer: Self-pay | Admitting: Medical Oncology

## 2015-04-28 DIAGNOSIS — Z510 Encounter for antineoplastic radiation therapy: Secondary | ICD-10-CM | POA: Diagnosis not present

## 2015-04-28 NOTE — Telephone Encounter (Signed)
Spoke with pt for chemo follow up call.  Pt stated he is doing fine.  Denied nausea/vomiting, denied pain, good appetite and drinking lots of fluids as tolerated, bowel and bladder function fine.  Pt starts radiation on 04/29/15.  Pt aware of next chemo appt on 05/05/15.

## 2015-04-28 NOTE — Telephone Encounter (Signed)
No answer- I left message to return call to triage or me about how he did after first chemo.Marland Kitchen

## 2015-04-29 ENCOUNTER — Ambulatory Visit
Admission: RE | Admit: 2015-04-29 | Discharge: 2015-04-29 | Disposition: A | Discharge status: Home / Self Care | Payer: Medicare Other | Source: Ambulatory Visit | Attending: Radiation Oncology | Admitting: Radiation Oncology

## 2015-04-29 DIAGNOSIS — Z510 Encounter for antineoplastic radiation therapy: Secondary | ICD-10-CM | POA: Diagnosis not present

## 2015-04-30 ENCOUNTER — Ambulatory Visit
Admission: RE | Admit: 2015-04-30 | Discharge: 2015-04-30 | Disposition: A | Discharge status: Home / Self Care | Payer: Medicare Other | Source: Ambulatory Visit | Attending: Radiation Oncology | Admitting: Radiation Oncology

## 2015-04-30 DIAGNOSIS — Z510 Encounter for antineoplastic radiation therapy: Secondary | ICD-10-CM | POA: Diagnosis not present

## 2015-05-04 ENCOUNTER — Other Ambulatory Visit: Payer: Medicare Other

## 2015-05-04 ENCOUNTER — Ambulatory Visit
Admission: RE | Admit: 2015-05-04 | Discharge: 2015-05-04 | Disposition: A | Discharge status: Home / Self Care | Payer: Medicare Other | Source: Ambulatory Visit | Attending: Radiation Oncology | Admitting: Radiation Oncology

## 2015-05-04 VITALS — BP 127/72 | HR 94 | Temp 98.0°F | Resp 20 | Wt 100.3 lb

## 2015-05-04 DIAGNOSIS — C3411 Malignant neoplasm of upper lobe, right bronchus or lung: Secondary | ICD-10-CM | POA: Insufficient documentation

## 2015-05-04 DIAGNOSIS — Z510 Encounter for antineoplastic radiation therapy: Secondary | ICD-10-CM | POA: Diagnosis not present

## 2015-05-04 DIAGNOSIS — Z87891 Personal history of nicotine dependence: Secondary | ICD-10-CM | POA: Diagnosis not present

## 2015-05-04 MED ORDER — RADIAPLEXRX EX GEL
Freq: Once | CUTANEOUS | Status: AC
  Administered 2015-05-04: 17:00:00 via TOPICAL

## 2015-05-04 NOTE — Progress Notes (Signed)
Weekly Management Note Current Dose:6 Gy  Projected Dose:60 Gy   Narrative:  The patient presents for routine under treatment assessment.  CBCT/MVCT images/Port film x-rays were reviewed.  The chart was checked. No complaints. Feeling well.   Physical Findings:  Unchanged  Vitals:  Filed Vitals:   05/04/15 1650  BP: 127/72  Pulse: 94  Temp: 98 F (36.7 C)  Resp: 20   Weight:  Wt Readings from Last 3 Encounters:  05/04/15 100 lb 4.8 oz (45.496 kg)  04/22/15 99 lb 9.6 oz (45.178 kg)  04/12/15 104 lb 1.6 oz (47.219 kg)   Lab Results  Component Value Date   WBC 5.3 04/27/2015   HGB 11.7* 04/27/2015   HCT 36.0* 04/27/2015   MCV 82.7 04/27/2015   PLT 269 04/27/2015   Lab Results  Component Value Date   CREATININE 0.8 04/27/2015   BUN 18.0 04/27/2015   NA 142 04/27/2015   K 3.8 04/27/2015   CL 98* 04/11/2015   CO2 30* 04/27/2015     Impression:  The patient is tolerating radiation.  Plan:  Continue treatment as planned. RN education performed.

## 2015-05-05 ENCOUNTER — Other Ambulatory Visit: Payer: Self-pay | Admitting: *Deleted

## 2015-05-05 ENCOUNTER — Other Ambulatory Visit (HOSPITAL_BASED_OUTPATIENT_CLINIC_OR_DEPARTMENT_OTHER): Payer: Medicare Other

## 2015-05-05 ENCOUNTER — Ambulatory Visit
Admission: RE | Admit: 2015-05-05 | Discharge: 2015-05-05 | Disposition: A | Discharge status: Home / Self Care | Payer: Medicare Other | Source: Ambulatory Visit | Attending: Radiation Oncology | Admitting: Radiation Oncology

## 2015-05-05 ENCOUNTER — Ambulatory Visit (HOSPITAL_BASED_OUTPATIENT_CLINIC_OR_DEPARTMENT_OTHER): Payer: Medicare Other

## 2015-05-05 VITALS — BP 132/58 | HR 69 | Temp 98.4°F

## 2015-05-05 DIAGNOSIS — C3491 Malignant neoplasm of unspecified part of right bronchus or lung: Secondary | ICD-10-CM

## 2015-05-05 DIAGNOSIS — C773 Secondary and unspecified malignant neoplasm of axilla and upper limb lymph nodes: Secondary | ICD-10-CM | POA: Diagnosis not present

## 2015-05-05 DIAGNOSIS — Z5111 Encounter for antineoplastic chemotherapy: Secondary | ICD-10-CM | POA: Diagnosis present

## 2015-05-05 DIAGNOSIS — C3411 Malignant neoplasm of upper lobe, right bronchus or lung: Secondary | ICD-10-CM | POA: Diagnosis present

## 2015-05-05 DIAGNOSIS — C3490 Malignant neoplasm of unspecified part of unspecified bronchus or lung: Secondary | ICD-10-CM

## 2015-05-05 DIAGNOSIS — Z510 Encounter for antineoplastic radiation therapy: Secondary | ICD-10-CM | POA: Diagnosis not present

## 2015-05-05 LAB — COMPREHENSIVE METABOLIC PANEL (CC13)
ALT: 17 U/L (ref 0–55)
ANION GAP: 8 meq/L (ref 3–11)
AST: 22 U/L (ref 5–34)
Albumin: 3.6 g/dL (ref 3.5–5.0)
Alkaline Phosphatase: 66 U/L (ref 40–150)
BUN: 20.7 mg/dL (ref 7.0–26.0)
CHLORIDE: 106 meq/L (ref 98–109)
CO2: 27 meq/L (ref 22–29)
CREATININE: 0.8 mg/dL (ref 0.7–1.3)
Calcium: 9.6 mg/dL (ref 8.4–10.4)
EGFR: >90 mL/min/{1.73_m2} (ref >90)
Glucose: 97 mg/dl (ref 70–140)
Potassium: 4.1 mEq/L (ref 3.5–5.1)
SODIUM: 142 meq/L (ref 136–145)
Total Bilirubin: 0.88 mg/dL (ref 0.20–1.20)
Total Protein: 7.1 g/dL (ref 6.4–8.3)

## 2015-05-05 LAB — CBC WITH DIFFERENTIAL/PLATELET
BASO%: 0.6 % (ref 0.0–2.0)
Basophils Absolute: 0 10*3/uL (ref 0.0–0.1)
EOS%: 1.5 % (ref 0.0–7.0)
Eosinophils Absolute: 0.1 10*3/uL (ref 0.0–0.5)
HCT: 35.9 % — ABNORMAL LOW (ref 38.4–49.9)
HGB: 11.7 g/dL — ABNORMAL LOW (ref 13.0–17.1)
LYMPH%: 18.1 % (ref 14.0–49.0)
MCH: 26.8 pg — ABNORMAL LOW (ref 27.2–33.4)
MCHC: 32.5 g/dL (ref 32.0–36.0)
MCV: 82.5 fL (ref 79.3–98.0)
MONO#: 0.3 10*3/uL (ref 0.1–0.9)
MONO%: 6.8 % (ref 0.0–14.0)
NEUT#: 3.4 10*3/uL (ref 1.5–6.5)
NEUT%: 73 % (ref 39.0–75.0)
Platelets: 230 10*3/uL (ref 140–400)
RBC: 4.36 10*6/uL (ref 4.20–5.82)
RDW: 14.5 % (ref 11.0–14.6)
WBC: 4.6 10*3/uL (ref 4.0–10.3)
lymph#: 0.8 10*3/uL — ABNORMAL LOW (ref 0.9–3.3)

## 2015-05-05 MED ORDER — FAMOTIDINE IN NACL 20-0.9 MG/50ML-% IV SOLN
INTRAVENOUS | Status: AC
  Filled 2015-05-05: qty 50

## 2015-05-05 MED ORDER — DIPHENHYDRAMINE HCL 50 MG/ML IJ SOLN
INTRAMUSCULAR | Status: AC
Start: 2015-05-05 — End: 2015-05-05
  Filled 2015-05-05: qty 1

## 2015-05-05 MED ORDER — SODIUM CHLORIDE 0.9 % IV SOLN
Freq: Once | INTRAVENOUS | Status: AC
  Administered 2015-05-05: 13:00:00 via INTRAVENOUS

## 2015-05-05 MED ORDER — FAMOTIDINE IN NACL 20-0.9 MG/50ML-% IV SOLN
20.0000 mg | Freq: Once | INTRAVENOUS | Status: AC
  Administered 2015-05-05: 20 mg via INTRAVENOUS

## 2015-05-05 MED ORDER — DEXTROSE 5 % IV SOLN
45.0000 mg/m2 | Freq: Once | INTRAVENOUS | Status: AC
  Administered 2015-05-05: 66 mg via INTRAVENOUS
  Filled 2015-05-05: qty 11

## 2015-05-05 MED ORDER — SODIUM CHLORIDE 0.9 % IV SOLN
Freq: Once | INTRAVENOUS | Status: AC
  Administered 2015-05-05: 14:00:00 via INTRAVENOUS
  Filled 2015-05-05: qty 8

## 2015-05-05 MED ORDER — SODIUM CHLORIDE 0.9 % IV SOLN
120.4000 mg | Freq: Once | INTRAVENOUS | Status: AC
  Administered 2015-05-05: 120 mg via INTRAVENOUS
  Filled 2015-05-05: qty 12

## 2015-05-05 MED ORDER — DIPHENHYDRAMINE HCL 50 MG/ML IJ SOLN
50.0000 mg | Freq: Once | INTRAMUSCULAR | Status: AC
  Administered 2015-05-05: 50 mg via INTRAVENOUS

## 2015-05-05 NOTE — Patient Instructions (Signed)
[facility] Discharge Instructions for Patients Receiving Chemotherapy  Today you received the following chemotherapy agents: Taxol and carboplatin.  To help prevent nausea and vomiting after your treatment, we encourage you to take your nausea medication: compazine 10 mf every 6 hours as needed.   If you develop nausea and vomiting that is not controlled by your nausea medication, call the clinic.   BELOW ARE SYMPTOMS THAT SHOULD BE REPORTED IMMEDIATELY:  *FEVER GREATER THAN 100.5 F  *CHILLS WITH OR WITHOUT FEVER  NAUSEA AND VOMITING THAT IS NOT CONTROLLED WITH YOUR NAUSEA MEDICATION  *UNUSUAL SHORTNESS OF BREATH  *UNUSUAL BRUISING OR BLEEDING  TENDERNESS IN MOUTH AND THROAT WITH OR WITHOUT PRESENCE OF ULCERS  *URINARY PROBLEMS  *BOWEL PROBLEMS  UNUSUAL RASH Items with * indicate a potential emergency and should be followed up as soon as possible.  Feel free to call the clinic you have any questions or concerns. The clinic phone number is [phone].  Please show the [facility] at check-in to the Emergency Department and triage nurse.

## 2015-05-06 ENCOUNTER — Ambulatory Visit
Admission: RE | Admit: 2015-05-06 | Discharge: 2015-05-06 | Disposition: A | Discharge status: Home / Self Care | Payer: Medicare Other | Source: Ambulatory Visit | Attending: Radiation Oncology | Admitting: Radiation Oncology

## 2015-05-06 DIAGNOSIS — Z510 Encounter for antineoplastic radiation therapy: Secondary | ICD-10-CM | POA: Diagnosis not present

## 2015-05-07 ENCOUNTER — Ambulatory Visit
Admission: RE | Admit: 2015-05-07 | Discharge: 2015-05-07 | Disposition: A | Discharge status: Home / Self Care | Payer: Medicare Other | Source: Ambulatory Visit | Attending: Radiation Oncology | Admitting: Radiation Oncology

## 2015-05-07 DIAGNOSIS — Z510 Encounter for antineoplastic radiation therapy: Secondary | ICD-10-CM | POA: Diagnosis not present

## 2015-05-10 ENCOUNTER — Ambulatory Visit
Admission: RE | Admit: 2015-05-10 | Discharge: 2015-05-10 | Disposition: A | Discharge status: Home / Self Care | Payer: Medicare Other | Source: Ambulatory Visit | Attending: Radiation Oncology | Admitting: Radiation Oncology

## 2015-05-10 DIAGNOSIS — Z510 Encounter for antineoplastic radiation therapy: Secondary | ICD-10-CM | POA: Diagnosis not present

## 2015-05-11 ENCOUNTER — Encounter: Payer: Self-pay | Admitting: Physician Assistant

## 2015-05-11 ENCOUNTER — Other Ambulatory Visit: Payer: Self-pay | Admitting: Physician Assistant

## 2015-05-11 ENCOUNTER — Ambulatory Visit
Admission: RE | Admit: 2015-05-11 | Discharge: 2015-05-11 | Disposition: A | Discharge status: Home / Self Care | Payer: Medicare Other | Source: Ambulatory Visit | Attending: Radiation Oncology | Admitting: Radiation Oncology

## 2015-05-11 ENCOUNTER — Other Ambulatory Visit (HOSPITAL_BASED_OUTPATIENT_CLINIC_OR_DEPARTMENT_OTHER): Payer: Medicare Other

## 2015-05-11 ENCOUNTER — Ambulatory Visit (HOSPITAL_BASED_OUTPATIENT_CLINIC_OR_DEPARTMENT_OTHER): Payer: Medicare Other

## 2015-05-11 ENCOUNTER — Ambulatory Visit (HOSPITAL_BASED_OUTPATIENT_CLINIC_OR_DEPARTMENT_OTHER): Payer: Medicare Other | Admitting: Physician Assistant

## 2015-05-11 ENCOUNTER — Telehealth: Payer: Self-pay | Admitting: Internal Medicine

## 2015-05-11 VITALS — BP 116/59 | HR 95 | Temp 98.2°F | Resp 20 | Ht 67.0 in | Wt 97.6 lb

## 2015-05-11 DIAGNOSIS — Z5111 Encounter for antineoplastic chemotherapy: Secondary | ICD-10-CM

## 2015-05-11 DIAGNOSIS — C3411 Malignant neoplasm of upper lobe, right bronchus or lung: Secondary | ICD-10-CM

## 2015-05-11 DIAGNOSIS — C3491 Malignant neoplasm of unspecified part of right bronchus or lung: Secondary | ICD-10-CM

## 2015-05-11 DIAGNOSIS — C773 Secondary and unspecified malignant neoplasm of axilla and upper limb lymph nodes: Secondary | ICD-10-CM

## 2015-05-11 DIAGNOSIS — Z510 Encounter for antineoplastic radiation therapy: Secondary | ICD-10-CM | POA: Diagnosis not present

## 2015-05-11 LAB — COMPREHENSIVE METABOLIC PANEL (CC13)
ALT: 15 U/L (ref 0–55)
AST: 21 U/L (ref 5–34)
Albumin: 3.6 g/dL (ref 3.5–5.0)
Alkaline Phosphatase: 65 U/L (ref 40–150)
Anion Gap: 8 mEq/L (ref 3–11)
BUN: 28.9 mg/dL — AB (ref 7.0–26.0)
CHLORIDE: 105 meq/L (ref 98–109)
CO2: 27 meq/L (ref 22–29)
CREATININE: 0.9 mg/dL (ref 0.7–1.3)
Calcium: 9.5 mg/dL (ref 8.4–10.4)
EGFR: 89 mL/min/{1.73_m2} — ABNORMAL LOW (ref >90)
Glucose: 85 mg/dl (ref 70–140)
Potassium: 4 mEq/L (ref 3.5–5.1)
Sodium: 140 mEq/L (ref 136–145)
Total Bilirubin: 1.42 mg/dL — ABNORMAL HIGH (ref 0.20–1.20)
Total Protein: 7.1 g/dL (ref 6.4–8.3)

## 2015-05-11 LAB — CBC WITH DIFFERENTIAL/PLATELET
BASO%: 0.6 % (ref 0.0–2.0)
Basophils Absolute: 0 10*3/uL (ref 0.0–0.1)
EOS%: 0.9 % (ref 0.0–7.0)
Eosinophils Absolute: 0 10*3/uL (ref 0.0–0.5)
HCT: 35.8 % — ABNORMAL LOW (ref 38.4–49.9)
HGB: 11.9 g/dL — ABNORMAL LOW (ref 13.0–17.1)
LYMPH%: 18.1 % (ref 14.0–49.0)
MCH: 27.2 pg (ref 27.2–33.4)
MCHC: 33.2 g/dL (ref 32.0–36.0)
MCV: 81.7 fL (ref 79.3–98.0)
MONO#: 0.2 10*3/uL (ref 0.1–0.9)
MONO%: 5 % (ref 0.0–14.0)
NEUT%: 75.4 % — ABNORMAL HIGH (ref 39.0–75.0)
NEUTROS ABS: 2.5 10*3/uL (ref 1.5–6.5)
Platelets: 155 10*3/uL (ref 140–400)
RBC: 4.38 10*6/uL (ref 4.20–5.82)
RDW: 14.4 % (ref 11.0–14.6)
WBC: 3.4 10*3/uL — AB (ref 4.0–10.3)
lymph#: 0.6 10*3/uL — ABNORMAL LOW (ref 0.9–3.3)
nRBC: 0 % (ref 0–0)

## 2015-05-11 MED ORDER — DIPHENHYDRAMINE HCL 50 MG/ML IJ SOLN
INTRAMUSCULAR | Status: AC
Start: 2015-05-11 — End: 2015-05-11
  Filled 2015-05-11: qty 1

## 2015-05-11 MED ORDER — FAMOTIDINE IN NACL 20-0.9 MG/50ML-% IV SOLN
20.0000 mg | Freq: Once | INTRAVENOUS | Status: AC
  Administered 2015-05-11: 20 mg via INTRAVENOUS

## 2015-05-11 MED ORDER — SODIUM CHLORIDE 0.9 % IV SOLN
Freq: Once | INTRAVENOUS | Status: AC
  Administered 2015-05-11: 13:00:00 via INTRAVENOUS

## 2015-05-11 MED ORDER — SODIUM CHLORIDE 0.9 % IV SOLN
120.4000 mg | Freq: Once | INTRAVENOUS | Status: AC
  Administered 2015-05-11: 120 mg via INTRAVENOUS
  Filled 2015-05-11: qty 12

## 2015-05-11 MED ORDER — SODIUM CHLORIDE 0.9 % IV SOLN
Freq: Once | INTRAVENOUS | Status: AC
  Administered 2015-05-11: 13:00:00 via INTRAVENOUS
  Filled 2015-05-11: qty 8

## 2015-05-11 MED ORDER — PACLITAXEL CHEMO INJECTION 300 MG/50ML
45.0000 mg/m2 | Freq: Once | INTRAVENOUS | Status: AC
  Administered 2015-05-11: 66 mg via INTRAVENOUS
  Filled 2015-05-11: qty 11

## 2015-05-11 MED ORDER — DIPHENHYDRAMINE HCL 50 MG/ML IJ SOLN
50.0000 mg | Freq: Once | INTRAMUSCULAR | Status: AC
  Administered 2015-05-11: 50 mg via INTRAVENOUS

## 2015-05-11 MED ORDER — FAMOTIDINE IN NACL 20-0.9 MG/50ML-% IV SOLN
INTRAVENOUS | Status: AC
  Filled 2015-05-11: qty 50

## 2015-05-11 NOTE — Progress Notes (Signed)
No images are attached to the encounter. No scans are attached to the encounter. No scans are attached to the encounter. [place], [place] [address]  DIAGNOSIS: Adenocarcinoma of lung, stage 2   Staging form: Lung, AJCC 7th Edition     Clinical: Stage IIA (T1a, N1, M0) - Signed by Curt Bears, MD on 04/22/2015       Staging comments: Adenocarcinoma  PRIOR THERAPY: none  CURRENT THERAPY: Course of concurrent chemoradiation with chemotherapy in the form of weekly carboplatin for an ECF to paclitaxel at 45 mg/m given for approximately 6-7 weeks (the duration of radiation therapy)  INTERVAL HISTORY: Frank Garcia 81 y.o. male returns for a scheduled regular symptom management visit for followup of his recently diagnosed stage II a non-small cell lung cancer, adenocarcinoma. He is currently undergoing a course of concurrent chemoradiation. He noted some slurred speech with chemotherapy, thought secondary to the Benadryl he received as pre-medication. Reports no further episodes. He has some constipation that is well managed with an OTC stool softener. He also has COPD, currently on oxygen via nasal cannula. His shortness of breath is at his baseline.He states his wife is in a nursing home and he visits her every day.  MEDICAL HISTORY: Past Medical History  Diagnosis Date  . BPH (benign prostatic hypertrophy)     takes Rapaflo daily  . Chronic airway obstruction, not elsewhere classified   . Asthma     uses Spiriva and Dulera daily  . Emphysema lung   . Chronic bronchitis   . Shortness of breath     Albuterol inhaler as needed;lying and exertion  . Pneumonia     "a few times" (08/26/2014)  . Cough     productive but without fever  . Arthritis   . Joint pain   . Cataracts, bilateral     immature  . History of shingles   . Cancer   . Hypertension     ALLERGIES:  has No Known  Allergies.  MEDICATIONS:  Current Outpatient Prescriptions  Medication Sig Dispense Refill  . albuterol (PROVENTIL HFA;VENTOLIN HFA) 108 (90 BASE) MCG/ACT inhaler Inhale 2 puffs into the lungs every 4 (four) hours as needed for wheezing or shortness of breath. 1 Inhaler 3  . albuterol (PROVENTIL) (2.5 MG/3ML) 0.083% nebulizer solution Take 3 mLs (2.5 mg total) by nebulization every 2 (two) hours as needed for wheezing or shortness of breath. 540 mL 4  . fluticasone (FLONASE) 50 MCG/ACT nasal spray Place 2 sprays into both nostrils daily. 16 g 5  . guaiFENesin (MUCINEX) 600 MG 12 hr tablet Take 1 tablet (600 mg total) by mouth 2 (two) times daily. 30 tablet 0  . HYDROcodone-homatropine (HYDROMET) 5-1.5 MG/5ML syrup Take 2.5 mLs by mouth at bedtime as needed. 240 mL 0  . mometasone-formoterol (DULERA) 200-5 MCG/ACT AERO Inhale 2 puffs into the lungs 2 (two) times daily. 2 Inhaler 0  . Nutritional Supplements (ENSURE HIGH PROTEIN) LIQD Take 1 Bottle by mouth 3 (three) times daily. To be taken 1 bottle three times a day between meals; not as meal substitution.. 09323 mL 3  . polyethylene glycol (MIRALAX / GLYCOLAX) packet Take 17 g by mouth daily. Hold for diarrhea 28 each 0  . prochlorperazine (COMPAZINE) 10 MG tablet Take 1 tablet (10 mg total) by mouth every 6 (six) hours as needed for nausea or vomiting. 30 tablet 0  . RAPAFLO 8 MG CAPS  capsule Take 1 capsule by mouth daily.    . Tiotropium Bromide Monohydrate (SPIRIVA RESPIMAT) 2.5 MCG/ACT AERS Inhale 2 puffs into the lungs daily. 3 Inhaler 1   No current facility-administered medications for this visit.    SURGICAL HISTORY:  Past Surgical History  Procedure Laterality Date  . No past surgeries    . Video bronchoscopy with endobronchial navigation N/A 03/10/2015    Procedure: VIDEO BRONCHOSCOPY WITH ENDOBRONCHIAL NAVIGATION and PLACEMENT OF 4MM X 7MM FIDUCIAL MARKERS x3;  Surgeon: Collene Gobble, MD;  Location: [facility];  Service: Thoracic;   Laterality: N/A;    REVIEW OF SYSTEMS:  Review of Systems  Constitutional: Negative for fever, chills, weight loss, malaise/fatigue and diaphoresis.  HENT: Negative for congestion, ear discharge, ear pain, hearing loss, nosebleeds, sore throat and tinnitus.   Eyes: Negative for blurred vision, double vision, photophobia, pain, discharge and redness.  Respiratory: Positive for shortness of breath. Negative for cough, hemoptysis, sputum production, wheezing and stridor.   Cardiovascular: Negative for chest pain, palpitations, orthopnea, claudication, leg swelling and PND.  Gastrointestinal: Positive for constipation. Negative for heartburn, nausea, vomiting, abdominal pain, diarrhea, blood in stool and melena.  Genitourinary: Negative.   Musculoskeletal: Negative.   Skin: Negative.   Neurological: Negative for dizziness, tingling, focal weakness, seizures, weakness and headaches.  Endo/Heme/Allergies: Does not bruise/bleed easily.  Psychiatric/Behavioral: Negative for depression. The patient is not nervous/anxious and does not have insomnia.      PHYSICAL EXAMINATION: Physical Exam  Constitutional: He is oriented to person, place, and time and well-developed, well-nourished, and in no distress.  Thin body habitus. On oxygen via nasal cannula  HENT:  Head: Normocephalic and atraumatic.  Mouth/Throat: Oropharynx is clear and moist.  Eyes: Pupils are equal, round, and reactive to light.  Neck: Normal range of motion. Neck supple. No JVD present. No tracheal deviation present. No thyromegaly present.  Cardiovascular: Normal rate, regular rhythm, normal heart sounds and intact distal pulses.  Exam reveals no gallop and no friction rub.   No murmur heard. Pulmonary/Chest: Effort normal and breath sounds normal. No respiratory distress. He has no wheezes. He has no rales.  Abdominal: Soft. Bowel sounds are normal. He exhibits no distension and no mass. There is no tenderness.  Musculoskeletal:  Normal range of motion. He exhibits no edema or tenderness.  Lymphadenopathy:    He has no cervical adenopathy.  Neurological: He is alert and oriented to person, place, and time. He has normal reflexes. Gait normal.  Skin: Skin is warm and dry. No rash noted.    ECOG PERFORMANCE STATUS: 1 - Symptomatic but completely ambulatory  Blood pressure 116/59, pulse 95, temperature 98.2 F (36.8 C), temperature source Oral, resp. rate 20, height '5\' 7"'$  (1.702 m), weight 97 lb 9.6 oz (44.271 kg), SpO2 100 %.  LABORATORY DATA: Lab Results  Component Value Date   WBC 3.4* 05/11/2015   HGB 11.9* 05/11/2015   HCT 35.8* 05/11/2015   MCV 81.7 05/11/2015   PLT 155 05/11/2015      Chemistry      Component Value Date/Time   NA 140 05/11/2015 1011   NA 141 04/11/2015 0011   K 4.0 05/11/2015 1011   K 3.5 04/11/2015 0011   CL 98* 04/11/2015 0011   CO2 27 05/11/2015 1011   CO2 31 04/11/2015 0011   BUN 28.9* 05/11/2015 1011   BUN 10 04/11/2015 0011   CREATININE 0.9 05/11/2015 1011   CREATININE 0.87 04/11/2015 0011  Component Value Date/Time   CALCIUM 9.5 05/11/2015 1011   CALCIUM 9.0 04/11/2015 0011   ALKPHOS 65 05/11/2015 1011   ALKPHOS 54 08/27/2014 0300   AST 21 05/11/2015 1011   AST 21 08/27/2014 0300   ALT 15 05/11/2015 1011   ALT 10 08/27/2014 0300   BILITOT 1.42* 05/11/2015 1011   BILITOT 1.2 08/27/2014 0300       RADIOGRAPHIC STUDIES:  Mr Jeri Cos OI Contrast  04/27/2015   CLINICAL DATA:  81 year old male with lung cancer. Evaluate for possibility of intracranial metastatic disease. Staging. History of hypertension. Initial encounter.  EXAM: MRI HEAD WITHOUT AND WITH CONTRAST  TECHNIQUE: Multiplanar, multiecho pulse sequences of the brain and surrounding structures were obtained without and with intravenous contrast.  CONTRAST:  41m MULTIHANCE GADOBENATE DIMEGLUMINE 529 MG/ML IV SOLN  COMPARISON:  None.  FINDINGS: No intracranial enhancing lesion or bony destructive lesion  to suggest the presence of intracranial metastatic disease.  No acute infarct.  No intracranial hemorrhage.  Prominent small vessel disease type changes.  Global atrophy. Ventricular prominence felt to be related to atrophy rather hydrocephalus.  Major intracranial vascular structures are patent.  Cervical spondylotic changes with mild spinal stenosis and slight cord flattening C3-4 level. Transverse ligament hypertrophy.  Cervical medullary junction, pituitary region, pineal region and orbital structures unremarkable.  Minimal partial opacification mastoid air cells without obstructing lesion of the eustachian tube. Minimal mucosal thickening/ partial opacification ethmoid sinus air cells.  IMPRESSION: No intracranial enhancing lesion or bony destructive lesion to suggest the presence of intracranial metastatic disease.  No acute infarct.  Prominent small vessel disease type changes.  Global atrophy.  Cervical spondylotic changes with mild spinal stenosis and slight cord flattening C3-4 level.  Minimal partial opacification mastoid air cells without obstructing lesion of the eustachian tube. Minimal mucosal thickening/ partial opacification ethmoid sinus air cells.   Electronically Signed   By: SGenia DelM.D.   On: 04/27/2015 08:50   Nm Pet Image Restag (ps) Skull Base To Thigh  04/16/2015   CLINICAL DATA:  Subsequent treatment strategy for lung carcinoma.  EXAM: NUCLEAR MEDICINE PET SKULL BASE TO THIGH  TECHNIQUE: 5.7 mCi F-18 FDG was injected intravenously. Full-ring PET imaging was performed from the skull base to thigh after the radiotracer. CT data was obtained and used for attenuation correction and anatomic localization.  FASTING BLOOD GLUCOSE:  Value: 64 mg/dl  COMPARISON:  PET-CT on 02/15/2015  FINDINGS: NECK  No hypermetabolic lymph nodes in the neck.  CHEST  13 mm pulmonary nodule in the central right upper lobe shows mild increase in size compared to 10 mm previously. This also shows increased  metabolic activity, with SUV max currently measuring 11.9 compared to 5.0 previously. This is highly suspicious for primary bronchogenic carcinoma.  Hypermetabolic airspace disease is seen in the inferior aspect of the lingula, and mild patchy hypermetabolic infiltrate is also seen in the right lower lobe. This is suspicious for pneumonia. Moderate to severe emphysema again noted.  Mild right hilar lymphadenopathy shows increased metabolic activity with SUV max of 16.2 compared to 4.4 previously. Mild subcarinal mediastinal lymphadenopathy shows no significant change with SUV max measuring 4.2 compared to 3.3 previously.  ABDOMEN/PELVIS  No abnormal hypermetabolic activity within the liver, pancreas, adrenal glands, or spleen. No hypermetabolic lymph nodes in the abdomen or pelvis.  Tiny calcified gallstones and left colonic diverticulosis incidentally noted.  SKELETON  No focal hypermetabolic activity to suggest skeletal metastasis.  IMPRESSION: Increased size and hypermetabolic activity  of 13 mm pulmonary nodule in the central right upper lobe, highly suspicious for primary bronchogenic carcinoma.  Increased hypermetabolic activity associated with mild right hilar lymphadenopathy. No significant change in mild hypermetabolic subcarinal lymphadenopathy.  Hypermetabolic infiltrate in right lower lobe and lingula, suspicious for pneumonia. Moderate severe pulmonary emphysema.  No evidence of metastatic disease within the neck, abdomen, or pelvis.   Electronically Signed   By: Earle Gell M.D.   On: 04/16/2015 16:22     ASSESSMENT/PLAN:  No problem-specific assessment & plan notes found for this encounter. The patient is a pleasant 81 year old African American male recently diagnosed with stage IIA (T1a,N1,M0) non small cell lung cancer favoring adenocarcinoma. He is currently undergoing a course of concurrent chemoradiation. THe patient was discussed with and also seen by Dr. Julien Nordmann. He will continue with his  course of concurrent chemoradiation as scheduled. We can decrease the dose of Benadryl if needed, we will monitor his symptoms with subsequent cycles of chemotherapy. He will follow up in 2 weeks for re-evaluation.  Awilda Metro E, PA-C 05/11/2015     All questions were answered. The patient knows to call the clinic with any problems, questions or concerns. We can certainly see the patient much sooner if necessary.

## 2015-05-11 NOTE — Telephone Encounter (Signed)
Gave and printed appt sched and avs fo rpt for SEpt and OCT °

## 2015-05-11 NOTE — Patient Instructions (Signed)
[facility] Discharge Instructions for Patients Receiving Chemotherapy  Today you received the following chemotherapy agents:  Taxol, Carboplatin To help prevent nausea and vomiting after your treatment, we encourage you to take your nausea medication.   If you develop nausea and vomiting that is not controlled by your nausea medication, call the clinic.   BELOW ARE SYMPTOMS THAT SHOULD BE REPORTED IMMEDIATELY:  *FEVER GREATER THAN 100.5 F  *CHILLS WITH OR WITHOUT FEVER  NAUSEA AND VOMITING THAT IS NOT CONTROLLED WITH YOUR NAUSEA MEDICATION  *UNUSUAL SHORTNESS OF BREATH  *UNUSUAL BRUISING OR BLEEDING  TENDERNESS IN MOUTH AND THROAT WITH OR WITHOUT PRESENCE OF ULCERS  *URINARY PROBLEMS  *BOWEL PROBLEMS  UNUSUAL RASH Items with * indicate a potential emergency and should be followed up as soon as possible.  Feel free to call the clinic you have any questions or concerns. The clinic phone number is [phone].  Please show the [facility] at check-in to the Emergency Department and triage nurse.

## 2015-05-11 NOTE — Progress Notes (Signed)
OK to treat per Awilda Metro despite elevated creat & bili.   Annette Stable RN

## 2015-05-12 ENCOUNTER — Ambulatory Visit
Admission: RE | Admit: 2015-05-12 | Discharge: 2015-05-12 | Disposition: A | Discharge status: Home / Self Care | Payer: Medicare Other | Source: Ambulatory Visit | Attending: Radiation Oncology | Admitting: Radiation Oncology

## 2015-05-12 DIAGNOSIS — Z510 Encounter for antineoplastic radiation therapy: Secondary | ICD-10-CM | POA: Diagnosis not present

## 2015-05-13 ENCOUNTER — Ambulatory Visit
Admission: RE | Admit: 2015-05-13 | Discharge: 2015-05-13 | Disposition: A | Discharge status: Home / Self Care | Payer: Medicare Other | Source: Ambulatory Visit | Attending: Radiation Oncology | Admitting: Radiation Oncology

## 2015-05-13 ENCOUNTER — Encounter: Payer: Self-pay | Admitting: Radiation Oncology

## 2015-05-13 VITALS — BP 123/65 | HR 98 | Temp 97.0°F | Resp 20 | Wt 97.9 lb

## 2015-05-13 DIAGNOSIS — C3411 Malignant neoplasm of upper lobe, right bronchus or lung: Secondary | ICD-10-CM

## 2015-05-13 DIAGNOSIS — Z510 Encounter for antineoplastic radiation therapy: Secondary | ICD-10-CM | POA: Diagnosis not present

## 2015-05-13 NOTE — Progress Notes (Addendum)
Weekly rad tx rt lung, sob, 3 liters n/c, 10tx completed, poor appetite, drinks 3 boos daily, soft foods, ate sausage gravy biscuit this am,  Occasional food gets stuck,   nad having hiccups sporatically lasting 30 minutes at at time, started last week, no skin changes not using cream as yet no pain 4:56 PM BP 123/65 mmHg  Pulse 98  Temp(Src) 97 F (36.1 C) (Oral)  Resp 20  Wt 97 lb 14.4 oz (44.407 kg)  SpO2 99%  Wt Readings from Last 3 Encounters:  05/13/15 97 lb 14.4 oz (44.407 kg)  05/11/15 97 lb 9.6 oz (44.271 kg)  05/04/15 100 lb 4.8 oz (45.496 kg)

## 2015-05-13 NOTE — Progress Notes (Signed)
Weekly Management Note Current Dose:20 Gy  Projected Dose:60 Gy   Narrative:  The patient presents for routine under treatment assessment.  CBCT/MVCT images/Port film x-rays were reviewed.  The chart was checked. No appetite. Drinking 3-4 boost/ensure per day. No pain with swallowing.   Physical Findings:  Unchanged  Vitals:  Filed Vitals:   05/13/15 1650  BP: 123/65  Pulse: 98  Temp: 97 F (36.1 C)  Resp: 20   Weight:  Wt Readings from Last 3 Encounters:  05/13/15 97 lb 14.4 oz (44.407 kg)  05/11/15 97 lb 9.6 oz (44.271 kg)  05/04/15 100 lb 4.8 oz (45.496 kg)   Lab Results  Component Value Date   WBC 3.4* 05/11/2015   HGB 11.9* 05/11/2015   HCT 35.8* 05/11/2015   MCV 81.7 05/11/2015   PLT 155 05/11/2015   Lab Results  Component Value Date   CREATININE 0.9 05/11/2015   BUN 28.9* 05/11/2015   NA 140 05/11/2015   K 4.0 05/11/2015   CL 98* 04/11/2015   CO2 27 05/11/2015     Impression:  The patient is tolerating radiation.  Plan:  Continue treatment as planned.  Discussed dietary modifcations to increase calories and protein.

## 2015-05-14 ENCOUNTER — Ambulatory Visit
Admission: RE | Admit: 2015-05-14 | Discharge: 2015-05-14 | Disposition: A | Discharge status: Home / Self Care | Payer: Medicare Other | Source: Ambulatory Visit | Attending: Radiation Oncology | Admitting: Radiation Oncology

## 2015-05-14 DIAGNOSIS — Z510 Encounter for antineoplastic radiation therapy: Secondary | ICD-10-CM | POA: Diagnosis not present

## 2015-05-17 ENCOUNTER — Ambulatory Visit: Payer: Medicare Other

## 2015-05-17 ENCOUNTER — Ambulatory Visit
Admission: RE | Admit: 2015-05-17 | Discharge: 2015-05-17 | Disposition: A | Discharge status: Home / Self Care | Payer: Medicare Other | Source: Ambulatory Visit | Attending: Radiation Oncology | Admitting: Radiation Oncology

## 2015-05-18 ENCOUNTER — Ambulatory Visit (HOSPITAL_BASED_OUTPATIENT_CLINIC_OR_DEPARTMENT_OTHER): Payer: Medicare Other

## 2015-05-18 ENCOUNTER — Ambulatory Visit
Admission: RE | Admit: 2015-05-18 | Discharge: 2015-05-18 | Disposition: A | Discharge status: Home / Self Care | Payer: Medicare Other | Source: Ambulatory Visit | Attending: Radiation Oncology | Admitting: Radiation Oncology

## 2015-05-18 ENCOUNTER — Encounter: Payer: Self-pay | Admitting: Adult Health

## 2015-05-18 ENCOUNTER — Ambulatory Visit (INDEPENDENT_AMBULATORY_CARE_PROVIDER_SITE_OTHER): Payer: Medicare Other | Admitting: Adult Health

## 2015-05-18 ENCOUNTER — Ambulatory Visit: Payer: Medicare Other | Admitting: Nutrition

## 2015-05-18 ENCOUNTER — Other Ambulatory Visit (HOSPITAL_BASED_OUTPATIENT_CLINIC_OR_DEPARTMENT_OTHER): Payer: Medicare Other

## 2015-05-18 ENCOUNTER — Ambulatory Visit (INDEPENDENT_AMBULATORY_CARE_PROVIDER_SITE_OTHER)
Admission: RE | Admit: 2015-05-18 | Discharge: 2015-05-18 | Disposition: A | Discharge status: Home / Self Care | Payer: Medicare Other | Source: Ambulatory Visit | Attending: Adult Health | Admitting: Adult Health

## 2015-05-18 VITALS — BP 114/73 | HR 89 | Temp 97.8°F | Resp 19

## 2015-05-18 VITALS — BP 114/73 | HR 89 | Temp 97.8°F | Wt 96.0 lb

## 2015-05-18 VITALS — BP 104/66 | HR 80 | Temp 97.8°F | Ht 67.0 in | Wt 96.0 lb

## 2015-05-18 DIAGNOSIS — J9611 Chronic respiratory failure with hypoxia: Secondary | ICD-10-CM | POA: Diagnosis not present

## 2015-05-18 DIAGNOSIS — C3411 Malignant neoplasm of upper lobe, right bronchus or lung: Secondary | ICD-10-CM | POA: Diagnosis not present

## 2015-05-18 DIAGNOSIS — Z5111 Encounter for antineoplastic chemotherapy: Secondary | ICD-10-CM | POA: Diagnosis present

## 2015-05-18 DIAGNOSIS — J449 Chronic obstructive pulmonary disease, unspecified: Secondary | ICD-10-CM

## 2015-05-18 DIAGNOSIS — J189 Pneumonia, unspecified organism: Secondary | ICD-10-CM

## 2015-05-18 DIAGNOSIS — Z510 Encounter for antineoplastic radiation therapy: Secondary | ICD-10-CM | POA: Diagnosis not present

## 2015-05-18 DIAGNOSIS — C3491 Malignant neoplasm of unspecified part of right bronchus or lung: Secondary | ICD-10-CM

## 2015-05-18 LAB — COMPREHENSIVE METABOLIC PANEL (CC13)
ALBUMIN: 3.6 g/dL (ref 3.5–5.0)
ALK PHOS: 62 U/L (ref 40–150)
ALT: 14 U/L (ref 0–55)
ANION GAP: 9 meq/L (ref 3–11)
AST: 22 U/L (ref 5–34)
BUN: 20.6 mg/dL (ref 7.0–26.0)
CALCIUM: 9.3 mg/dL (ref 8.4–10.4)
CO2: 26 mEq/L (ref 22–29)
CREATININE: 0.9 mg/dL (ref 0.7–1.3)
Chloride: 103 mEq/L (ref 98–109)
EGFR: >90 mL/min/{1.73_m2} (ref >90)
Glucose: 117 mg/dl (ref 70–140)
POTASSIUM: 4 meq/L (ref 3.5–5.1)
Sodium: 138 mEq/L (ref 136–145)
Total Bilirubin: 1.24 mg/dL — ABNORMAL HIGH (ref 0.20–1.20)
Total Protein: 7.1 g/dL (ref 6.4–8.3)

## 2015-05-18 LAB — CBC WITH DIFFERENTIAL/PLATELET
BASO%: 0.4 % (ref 0.0–2.0)
Basophils Absolute: 0 10*3/uL (ref 0.0–0.1)
EOS ABS: 0 10*3/uL (ref 0.0–0.5)
EOS%: 0.5 % (ref 0.0–7.0)
HCT: 35.5 % — ABNORMAL LOW (ref 38.4–49.9)
HEMOGLOBIN: 11.7 g/dL — AB (ref 13.0–17.1)
LYMPH%: 6.5 % — AB (ref 14.0–49.0)
MCH: 27 pg — ABNORMAL LOW (ref 27.2–33.4)
MCHC: 32.9 g/dL (ref 32.0–36.0)
MCV: 82 fL (ref 79.3–98.0)
MONO#: 0.3 10*3/uL (ref 0.1–0.9)
MONO%: 6.9 % (ref 0.0–14.0)
NEUT%: 85.7 % — ABNORMAL HIGH (ref 39.0–75.0)
NEUTROS ABS: 4.2 10*3/uL (ref 1.5–6.5)
Platelets: 193 10*3/uL (ref 140–400)
RBC: 4.33 10*6/uL (ref 4.20–5.82)
RDW: 14.2 % (ref 11.0–14.6)
WBC: 4.9 10*3/uL (ref 4.0–10.3)
lymph#: 0.3 10*3/uL — ABNORMAL LOW (ref 0.9–3.3)

## 2015-05-18 MED ORDER — SODIUM CHLORIDE 0.9 % IV SOLN
120.4000 mg | Freq: Once | INTRAVENOUS | Status: AC
  Administered 2015-05-18: 120 mg via INTRAVENOUS
  Filled 2015-05-18: qty 12

## 2015-05-18 MED ORDER — SODIUM CHLORIDE 0.9 % IV SOLN
Freq: Once | INTRAVENOUS | Status: AC
  Administered 2015-05-18: 14:00:00 via INTRAVENOUS
  Filled 2015-05-18: qty 8

## 2015-05-18 MED ORDER — PACLITAXEL CHEMO INJECTION 300 MG/50ML
45.0000 mg/m2 | Freq: Once | INTRAVENOUS | Status: AC
  Administered 2015-05-18: 66 mg via INTRAVENOUS
  Filled 2015-05-18: qty 11

## 2015-05-18 MED ORDER — DIPHENHYDRAMINE HCL 50 MG/ML IJ SOLN
INTRAMUSCULAR | Status: AC
  Filled 2015-05-18: qty 1

## 2015-05-18 MED ORDER — DIPHENHYDRAMINE HCL 50 MG/ML IJ SOLN
50.0000 mg | Freq: Once | INTRAMUSCULAR | Status: AC
  Administered 2015-05-18: 50 mg via INTRAVENOUS

## 2015-05-18 MED ORDER — FAMOTIDINE IN NACL 20-0.9 MG/50ML-% IV SOLN
INTRAVENOUS | Status: AC
  Filled 2015-05-18: qty 50

## 2015-05-18 MED ORDER — FAMOTIDINE IN NACL 20-0.9 MG/50ML-% IV SOLN
20.0000 mg | Freq: Once | INTRAVENOUS | Status: AC
  Administered 2015-05-18: 20 mg via INTRAVENOUS

## 2015-05-18 MED ORDER — SODIUM CHLORIDE 0.9 % IV SOLN
Freq: Once | INTRAVENOUS | Status: AC
  Administered 2015-05-18: 14:00:00 via INTRAVENOUS

## 2015-05-18 NOTE — Assessment & Plan Note (Signed)
Stage II non small lung cancer  Cont w/ follow up w/ oncology for chemo and XRT as directed.

## 2015-05-18 NOTE — Assessment & Plan Note (Signed)
Compensated on present regimen   Plan  Continue on current regimen .  Follow up with oncology as planned .  Follow up in 3 months and As needed

## 2015-05-18 NOTE — Patient Instructions (Signed)
[facility] Discharge Instructions for Patients Receiving Chemotherapy  Today you received the following chemotherapy agents Taxol/Carboplatin.  To help prevent nausea and vomiting after your treatment, we encourage you to take your nausea medication as directed.   If you develop nausea and vomiting that is not controlled by your nausea medication, call the clinic.   BELOW ARE SYMPTOMS THAT SHOULD BE REPORTED IMMEDIATELY:  *FEVER GREATER THAN 100.5 F  *CHILLS WITH OR WITHOUT FEVER  NAUSEA AND VOMITING THAT IS NOT CONTROLLED WITH YOUR NAUSEA MEDICATION  *UNUSUAL SHORTNESS OF BREATH  *UNUSUAL BRUISING OR BLEEDING  TENDERNESS IN MOUTH AND THROAT WITH OR WITHOUT PRESENCE OF ULCERS  *URINARY PROBLEMS  *BOWEL PROBLEMS  UNUSUAL RASH Items with * indicate a potential emergency and should be followed up as soon as possible.  Feel free to call the clinic you have any questions or concerns. The clinic phone number is [phone].  Please show the [facility] at check-in to the Emergency Department and triage nurse.

## 2015-05-18 NOTE — Progress Notes (Signed)
Nutrition follow up with patient during infusion for lung cancer. Patient reports no appetite. States he eats one meal daily and drinks 3-4 Ensure Plus or Boost Plus. He states it is easier to drink supplements than to eat food. He has snacks available. Weight decreased and documented as 96 pounds, down from 99.6 pounds on August 25.  Nutrition Diagnosis:  Increased nutrient needs continues.  Intervention: Patient educated to add one to two snacks daily. Recommended patient increase oral nutrition supplements to QID to 5 times daily for additional calories. Provided samples and coupons. Questions answered and teach back method used.  Monitoring, Evaluation, Goals:  Patient will increase calories and protein to promote weight gain.  Next Visit: Tuesday, September 27.

## 2015-05-18 NOTE — Assessment & Plan Note (Signed)
Compensated on O2 .   Plan  ontinue on current regimen .  Follow up with oncology as planned .  Follow up in 3 months and As needed

## 2015-05-18 NOTE — Assessment & Plan Note (Signed)
Recent admit, clinically improving  cxr today shows clear lungs   Plan  continue on current regimen .  Follow up with oncology as planned .  Follow up in 3 months and As needed

## 2015-05-18 NOTE — Progress Notes (Signed)
Weekly Management Note Current Dose:24 Gy  Projected Dose:60 Gy   Narrative:  The patient presents for routine under treatment assessment.  CBCT/MVCT images/Port film x-rays were reviewed.  The chart was checked. No appetite. Drinking 3-4 boost/ensure per day. No pain with swallowing. CXR today with no acute process.  Physical Findings:  Unchanged  Vitals:  There were no vitals filed for this visit. Weight:  Wt Readings from Last 3 Encounters:  05/18/15 96 lb (43.545 kg)  05/13/15 97 lb 14.4 oz (44.407 kg)  05/11/15 97 lb 9.6 oz (44.271 kg)   Lab Results  Component Value Date   WBC 4.9 05/18/2015   HGB 11.7* 05/18/2015   HCT 35.5* 05/18/2015   MCV 82.0 05/18/2015   PLT 193 05/18/2015   Lab Results  Component Value Date   CREATININE 0.9 05/18/2015   BUN 20.6 05/18/2015   NA 138 05/18/2015   K 4.0 05/18/2015   CL 98* 04/11/2015   CO2 26 05/18/2015     Impression:  The patient is tolerating radiation.  Plan:  Continue treatment as planned.  Discussed dietary modifcations to increase calories and protein.

## 2015-05-18 NOTE — Patient Instructions (Signed)
Continue on current regimen .  Follow up with oncology as planned .  Follow up in 3 months and As needed

## 2015-05-18 NOTE — Progress Notes (Signed)
   Subjective:    Patient ID: Frank Garcia, male    DOB: November 02, 1928, 81 y.o.   MRN: [id number]  HPI 81 yo male former smoker with Stage IIA Lung Adenocarcinoma and severe COPD    05/18/2015 Follow up : COPD and Lung Cancer and PNA  Pt returns for a post hospital follow up . He was recently admitted for CAP and acute on chronic resp failure . He was treated with IV abx and discharged on Levaquin .  CXR today shows clear lungs . He has finished all abx.  He is feeling better but weak.  Remains on spiriva daily .  Remains on O2 at 3l/m w/ good O2 sats.  No fever, chest pain, orthopnea or edema. No fever.   He has recently been dx w/ Stage II non small cell lung cancer, adenocarcinoma and is undergoing concurrent chemoradiation with carboplatin and XRT . Says he has good and bad days. Very tired w/ no energy.   Review of Systems  Constitutional: Negative for fever and unexpected weight change.  HENT: Negative for congestion, dental problem, ear pain, nosebleeds, postnasal drip, rhinorrhea, sinus pressure, sneezing, sore throat and trouble swallowing.   Eyes: Negative for redness and itching.  Respiratory: Positive f  shortness of breath. Negative for chest tightness and wheezing.   Cardiovascular: Negative for palpitations and leg swelling.  Gastrointestinal: Negative for nausea and vomiting.  Genitourinary: Negative for dysuria.  Musculoskeletal: Negative for joint swelling.  Skin: Negative for rash.  Neurological: Negative for headaches.  Hematological: Does not bruise/bleed easily.  Psychiatric/Behavioral: Negative for dysphoric mood. The patient is not nervous/anxious.        Objective:   Physical Exam VS reviewed  GEN: A/Ox3; pleasant , NAD, thin and frail on O2   HEENT:  [place]/AT,  EACs-clear, TMs-wnl, NOSE-clear, THROAT-clear, no lesions, no postnasal drip or exudate noted.   NECK:  Supple w/ fair ROM; no JVD; normal carotid impulses w/o bruits; no thyromegaly or nodules  palpated; no lymphadenopathy.  RESP  Decreased BS in bases .no accessory muscle use, no dullness to percussion  CARD:  RRR, no m/r/g  , no peripheral edema, pulses intact, no cyanosis or clubbing.  GI:   Soft & nt; nml bowel sounds; no organomegaly or masses detected.  Musco: Warm bil, no deformities or joint swelling noted.   Neuro: alert, no focal deficits noted.    Skin: Warm, no lesions or rashes '   CXR reveiwed independently  Lung clear, stable copd changes  Assessment & Plan:

## 2015-05-19 ENCOUNTER — Ambulatory Visit
Admission: RE | Admit: 2015-05-19 | Discharge: 2015-05-19 | Disposition: A | Discharge status: Home / Self Care | Payer: Medicare Other | Source: Ambulatory Visit | Attending: Radiation Oncology | Admitting: Radiation Oncology

## 2015-05-19 DIAGNOSIS — Z510 Encounter for antineoplastic radiation therapy: Secondary | ICD-10-CM | POA: Diagnosis not present

## 2015-05-20 ENCOUNTER — Ambulatory Visit
Admission: RE | Admit: 2015-05-20 | Discharge: 2015-05-20 | Disposition: A | Discharge status: Home / Self Care | Payer: Medicare Other | Source: Ambulatory Visit | Attending: Radiation Oncology | Admitting: Radiation Oncology

## 2015-05-20 ENCOUNTER — Other Ambulatory Visit: Payer: Self-pay | Admitting: Internal Medicine

## 2015-05-20 DIAGNOSIS — Z510 Encounter for antineoplastic radiation therapy: Secondary | ICD-10-CM | POA: Diagnosis not present

## 2015-05-21 ENCOUNTER — Ambulatory Visit
Admission: RE | Admit: 2015-05-21 | Discharge: 2015-05-21 | Disposition: A | Discharge status: Home / Self Care | Payer: Medicare Other | Source: Ambulatory Visit | Attending: Radiation Oncology | Admitting: Radiation Oncology

## 2015-05-21 DIAGNOSIS — Z510 Encounter for antineoplastic radiation therapy: Secondary | ICD-10-CM | POA: Diagnosis not present

## 2015-05-24 ENCOUNTER — Ambulatory Visit
Admission: RE | Admit: 2015-05-24 | Discharge: 2015-05-24 | Disposition: A | Discharge status: Home / Self Care | Payer: Medicare Other | Source: Ambulatory Visit | Attending: Radiation Oncology | Admitting: Radiation Oncology

## 2015-05-24 DIAGNOSIS — Z510 Encounter for antineoplastic radiation therapy: Secondary | ICD-10-CM | POA: Diagnosis not present

## 2015-05-25 ENCOUNTER — Telehealth: Payer: Self-pay | Admitting: Internal Medicine

## 2015-05-25 ENCOUNTER — Ambulatory Visit: Payer: Medicare Other | Admitting: Nutrition

## 2015-05-25 ENCOUNTER — Other Ambulatory Visit (HOSPITAL_BASED_OUTPATIENT_CLINIC_OR_DEPARTMENT_OTHER): Payer: Medicare Other

## 2015-05-25 ENCOUNTER — Ambulatory Visit (HOSPITAL_BASED_OUTPATIENT_CLINIC_OR_DEPARTMENT_OTHER): Payer: Medicare Other

## 2015-05-25 ENCOUNTER — Ambulatory Visit
Admission: RE | Admit: 2015-05-25 | Discharge: 2015-05-25 | Disposition: A | Discharge status: Home / Self Care | Payer: Medicare Other | Source: Ambulatory Visit | Attending: Radiation Oncology | Admitting: Radiation Oncology

## 2015-05-25 ENCOUNTER — Encounter: Payer: Self-pay | Admitting: Physician Assistant

## 2015-05-25 ENCOUNTER — Ambulatory Visit (HOSPITAL_BASED_OUTPATIENT_CLINIC_OR_DEPARTMENT_OTHER): Payer: Medicare Other | Admitting: Physician Assistant

## 2015-05-25 VITALS — BP 120/99 | HR 99 | Temp 97.8°F | Resp 16 | Ht 67.0 in | Wt 97.9 lb

## 2015-05-25 DIAGNOSIS — Z5111 Encounter for antineoplastic chemotherapy: Secondary | ICD-10-CM | POA: Diagnosis present

## 2015-05-25 DIAGNOSIS — C3411 Malignant neoplasm of upper lobe, right bronchus or lung: Secondary | ICD-10-CM | POA: Diagnosis not present

## 2015-05-25 DIAGNOSIS — C3491 Malignant neoplasm of unspecified part of right bronchus or lung: Secondary | ICD-10-CM

## 2015-05-25 DIAGNOSIS — Z510 Encounter for antineoplastic radiation therapy: Secondary | ICD-10-CM | POA: Diagnosis not present

## 2015-05-25 LAB — CBC WITH DIFFERENTIAL/PLATELET
BASO%: 0.9 % (ref 0.0–2.0)
BASOS ABS: 0 10*3/uL (ref 0.0–0.1)
EOS ABS: 0 10*3/uL (ref 0.0–0.5)
EOS%: 0.8 % (ref 0.0–7.0)
HEMATOCRIT: 36.1 % — AB (ref 38.4–49.9)
HGB: 11.8 g/dL — ABNORMAL LOW (ref 13.0–17.1)
LYMPH#: 0.4 10*3/uL — AB (ref 0.9–3.3)
LYMPH%: 14 % (ref 14.0–49.0)
MCH: 26.8 pg — AB (ref 27.2–33.4)
MCHC: 32.6 g/dL (ref 32.0–36.0)
MCV: 82.3 fL (ref 79.3–98.0)
MONO#: 0.2 10*3/uL (ref 0.1–0.9)
MONO%: 6.9 % (ref 0.0–14.0)
NEUT#: 2.2 10*3/uL (ref 1.5–6.5)
NEUT%: 77.4 % — AB (ref 39.0–75.0)
PLATELETS: 191 10*3/uL (ref 140–400)
RBC: 4.38 10*6/uL (ref 4.20–5.82)
RDW: 15.3 % — ABNORMAL HIGH (ref 11.0–14.6)
WBC: 2.8 10*3/uL — ABNORMAL LOW (ref 4.0–10.3)

## 2015-05-25 LAB — COMPREHENSIVE METABOLIC PANEL (CC13)
ALT: 18 U/L (ref 0–55)
ANION GAP: 10 meq/L (ref 3–11)
AST: 22 U/L (ref 5–34)
Albumin: 3.6 g/dL (ref 3.5–5.0)
Alkaline Phosphatase: 63 U/L (ref 40–150)
BUN: 20.1 mg/dL (ref 7.0–26.0)
CHLORIDE: 103 meq/L (ref 98–109)
CO2: 26 meq/L (ref 22–29)
Calcium: 9.2 mg/dL (ref 8.4–10.4)
Creatinine: 0.9 mg/dL (ref 0.7–1.3)
EGFR: 86 mL/min/{1.73_m2} — AB (ref >90)
GLUCOSE: 93 mg/dL (ref 70–140)
POTASSIUM: 3.8 meq/L (ref 3.5–5.1)
SODIUM: 139 meq/L (ref 136–145)
Total Bilirubin: 1.22 mg/dL — ABNORMAL HIGH (ref 0.20–1.20)
Total Protein: 7.1 g/dL (ref 6.4–8.3)

## 2015-05-25 MED ORDER — DIPHENHYDRAMINE HCL 50 MG/ML IJ SOLN
INTRAMUSCULAR | Status: AC
  Filled 2015-05-25: qty 1

## 2015-05-25 MED ORDER — DIPHENHYDRAMINE HCL 50 MG/ML IJ SOLN
50.0000 mg | Freq: Once | INTRAMUSCULAR | Status: AC
  Administered 2015-05-25: 50 mg via INTRAVENOUS

## 2015-05-25 MED ORDER — PACLITAXEL CHEMO INJECTION 300 MG/50ML
45.0000 mg/m2 | Freq: Once | INTRAVENOUS | Status: AC
  Administered 2015-05-25: 66 mg via INTRAVENOUS
  Filled 2015-05-25: qty 11

## 2015-05-25 MED ORDER — FAMOTIDINE IN NACL 20-0.9 MG/50ML-% IV SOLN
INTRAVENOUS | Status: AC
  Filled 2015-05-25: qty 50

## 2015-05-25 MED ORDER — SODIUM CHLORIDE 0.9 % IV SOLN
120.4000 mg | Freq: Once | INTRAVENOUS | Status: AC
  Administered 2015-05-25: 120 mg via INTRAVENOUS
  Filled 2015-05-25: qty 12

## 2015-05-25 MED ORDER — SODIUM CHLORIDE 0.9 % IV SOLN
Freq: Once | INTRAVENOUS | Status: AC
  Administered 2015-05-25: 14:00:00 via INTRAVENOUS
  Filled 2015-05-25: qty 8

## 2015-05-25 MED ORDER — SODIUM CHLORIDE 0.9 % IV SOLN
Freq: Once | INTRAVENOUS | Status: AC
  Administered 2015-05-25: 14:00:00 via INTRAVENOUS

## 2015-05-25 MED ORDER — FAMOTIDINE IN NACL 20-0.9 MG/50ML-% IV SOLN
20.0000 mg | Freq: Once | INTRAVENOUS | Status: AC
  Administered 2015-05-25: 20 mg via INTRAVENOUS

## 2015-05-25 NOTE — Patient Instructions (Signed)
Continue your course of concurrent chemoradiation as scheduled Followup in 2 weeks 

## 2015-05-25 NOTE — Telephone Encounter (Signed)
Gave and pritned appt sched and avs fo rpt for Sept thru OCT

## 2015-05-25 NOTE — Progress Notes (Signed)
No images are attached to the encounter. No scans are attached to the encounter. No scans are attached to the encounter. [place], MD [place] [address]  DIAGNOSIS: Adenocarcinoma of lung, stage 2   Staging form: Lung, AJCC 7th Edition     Clinical: Stage IIA (T1a, N1, M0) - Signed by Curt Bears, MD on 04/22/2015       Staging comments: Adenocarcinoma  PRIOR THERAPY: none  CURRENT THERAPY: Course of concurrent chemoradiation with chemotherapy in the form of weekly carboplatin for an ECF to paclitaxel at 45 mg/m given for approximately 6-7 weeks (the duration of radiation therapy)  INTERVAL HISTORY: Frank Garcia 81 y.o. male returns for a scheduled regular symptom management visit for followup of his recently diagnosed stage II a non-small cell lung cancer, adenocarcinoma. He is currently undergoing a course of concurrent chemoradiation. He has had no further episodes of slurred speech and is tolerating the Benadryl without further incident. Reports some discomfort with swallowing and some esophageal discomfort. He is otherwise tolerating his course of concurrent chemoradiation relatively well except for some fatigue.  MEDICAL HISTORY: Past Medical History  Diagnosis Date  . BPH (benign prostatic hypertrophy)     takes Rapaflo daily  . Chronic airway obstruction, not elsewhere classified   . Asthma     uses Spiriva and Dulera daily  . Emphysema lung   . Chronic bronchitis   . Shortness of breath     Albuterol inhaler as needed;lying and exertion  . Pneumonia     "a few times" (08/26/2014)  . Cough     productive but without fever  . Arthritis   . Joint pain   . Cataracts, bilateral     immature  . History of shingles   . Cancer   . Hypertension     ALLERGIES:  has No Known Allergies.  MEDICATIONS:  Current Outpatient Prescriptions  Medication Sig Dispense Refill  . albuterol (PROVENTIL  HFA;VENTOLIN HFA) 108 (90 BASE) MCG/ACT inhaler Inhale 2 puffs into the lungs every 4 (four) hours as needed for wheezing or shortness of breath. 1 Inhaler 3  . albuterol (PROVENTIL) (2.5 MG/3ML) 0.083% nebulizer solution Take 3 mLs (2.5 mg total) by nebulization every 2 (two) hours as needed for wheezing or shortness of breath. 540 mL 4  . fluticasone (FLONASE) 50 MCG/ACT nasal spray Place 2 sprays into both nostrils daily. 16 g 5  . guaiFENesin (MUCINEX) 600 MG 12 hr tablet Take 1 tablet (600 mg total) by mouth 2 (two) times daily. 30 tablet 0  . HYDROcodone-homatropine (HYDROMET) 5-1.5 MG/5ML syrup Take 2.5 mLs by mouth at bedtime as needed. 240 mL 0  . mometasone-formoterol (DULERA) 200-5 MCG/ACT AERO Inhale 2 puffs into the lungs 2 (two) times daily. 2 Inhaler 0  . Nutritional Supplements (ENSURE HIGH PROTEIN) LIQD Take 1 Bottle by mouth 3 (three) times daily. To be taken 1 bottle three times a day between meals; not as meal substitution.. 42353 mL 3  . polyethylene glycol (MIRALAX / GLYCOLAX) packet Take 17 g by mouth daily. Hold for diarrhea 28 each 0  . prochlorperazine (COMPAZINE) 10 MG tablet Take 1 tablet (10 mg total) by mouth every 6 (six) hours as needed for nausea or vomiting. 30 tablet 0  . RAPAFLO 8 MG CAPS capsule Take 1 capsule by mouth daily.    Marland Kitchen SPIRIVA RESPIMAT 2.5 MCG/ACT AERS INHALE 2 PUFFS INTO THE LUNGS DAILY. 4  g 2  . Tiotropium Bromide Monohydrate (SPIRIVA RESPIMAT) 2.5 MCG/ACT AERS Inhale 2 puffs into the lungs daily. 3 Inhaler 1   No current facility-administered medications for this visit.   Facility-Administered Medications Ordered in Other Visits  Medication Dose Route Frequency Provider Last Rate Last Dose  . 0.9 %  sodium chloride infusion   Intravenous Once Curt Bears, MD      . CARBOplatin (PARAPLATIN) 120 mg in sodium chloride 0.9 % 100 mL chemo infusion  120 mg Intravenous Once Curt Bears, MD      . diphenhydrAMINE (BENADRYL) injection 50 mg  50 mg  Intravenous Once Curt Bears, MD      . famotidine (PEPCID) IVPB 20 mg premix  20 mg Intravenous Once Curt Bears, MD      . ondansetron (ZOFRAN) 16 mg, dexamethasone (DECADRON) 20 mg in sodium chloride 0.9 % 50 mL IVPB   Intravenous Once Curt Bears, MD      . PACLitaxel (TAXOL) 66 mg in dextrose 5 % 250 mL chemo infusion (</= '80mg'$ /m2)  45 mg/m2 (Treatment Plan Actual) Intravenous Once Curt Bears, MD        SURGICAL HISTORY:  Past Surgical History  Procedure Laterality Date  . No past surgeries    . Video bronchoscopy with endobronchial navigation N/A 03/10/2015    Procedure: VIDEO BRONCHOSCOPY WITH ENDOBRONCHIAL NAVIGATION and PLACEMENT OF 4MM X 7MM FIDUCIAL MARKERS x3;  Surgeon: Collene Gobble, MD;  Location: [facility];  Service: Thoracic;  Laterality: N/A;    REVIEW OF SYSTEMS:  Review of Systems  Constitutional: Positive for malaise/fatigue. Negative for fever, chills, weight loss and diaphoresis.  HENT: Negative for congestion, ear discharge, ear pain, hearing loss, nosebleeds, sore throat and tinnitus.   Eyes: Negative for blurred vision, double vision, photophobia, pain, discharge and redness.  Respiratory: Positive for shortness of breath. Negative for cough, hemoptysis, sputum production, wheezing and stridor.   Cardiovascular: Negative for chest pain, palpitations, orthopnea, claudication, leg swelling and PND.  Gastrointestinal: Negative for heartburn, nausea, vomiting, abdominal pain, diarrhea, blood in stool and melena.       Discomfort with swallowing, esophageal discomfort  Genitourinary: Negative.   Musculoskeletal: Negative.   Skin: Negative.   Neurological: Negative for dizziness, tingling, focal weakness, seizures, weakness and headaches.  Endo/Heme/Allergies: Does not bruise/bleed easily.  Psychiatric/Behavioral: Negative for depression. The patient is not nervous/anxious and does not have insomnia.      PHYSICAL EXAMINATION: Physical Exam   Constitutional: He is oriented to person, place, and time and well-developed, well-nourished, and in no distress.  Thin body habitus. On oxygen via nasal cannula  HENT:  Head: Normocephalic and atraumatic.  Mouth/Throat: Oropharynx is clear and moist.  Eyes: Pupils are equal, round, and reactive to light.  Neck: Normal range of motion. Neck supple. No JVD present. No tracheal deviation present. No thyromegaly present.  Cardiovascular: Normal rate, regular rhythm, normal heart sounds and intact distal pulses.  Exam reveals no gallop and no friction rub.   No murmur heard. Pulmonary/Chest: Effort normal and breath sounds normal. No respiratory distress. He has no wheezes. He has no rales.  Abdominal: Soft. Bowel sounds are normal. He exhibits no distension and no mass. There is no tenderness.  Musculoskeletal: Normal range of motion. He exhibits no edema or tenderness.  Lymphadenopathy:    He has no cervical adenopathy.  Neurological: He is alert and oriented to person, place, and time. He has normal reflexes. Gait normal.  Skin: Skin is warm and dry.  No rash noted.    ECOG PERFORMANCE STATUS: 1 - Symptomatic but completely ambulatory  Blood pressure 120/99, pulse 99, temperature 97.8 F (36.6 C), temperature source Oral, resp. rate 16, height '5\' 7"'$  (1.702 m), weight 97 lb 14.4 oz (44.407 kg), SpO2 100 %.  LABORATORY DATA: Lab Results  Component Value Date   WBC 2.8* 05/25/2015   HGB 11.8* 05/25/2015   HCT 36.1* 05/25/2015   MCV 82.3 05/25/2015   PLT 191 05/25/2015      Chemistry      Component Value Date/Time   NA 139 05/25/2015 1211   NA 141 04/11/2015 0011   K 3.8 05/25/2015 1211   K 3.5 04/11/2015 0011   CL 98* 04/11/2015 0011   CO2 26 05/25/2015 1211   CO2 31 04/11/2015 0011   BUN 20.1 05/25/2015 1211   BUN 10 04/11/2015 0011   CREATININE 0.9 05/25/2015 1211   CREATININE 0.87 04/11/2015 0011      Component Value Date/Time   CALCIUM 9.2 05/25/2015 1211   CALCIUM  9.0 04/11/2015 0011   ALKPHOS 63 05/25/2015 1211   ALKPHOS 54 08/27/2014 0300   AST 22 05/25/2015 1211   AST 21 08/27/2014 0300   ALT 18 05/25/2015 1211   ALT 10 08/27/2014 0300   BILITOT 1.22* 05/25/2015 1211   BILITOT 1.2 08/27/2014 0300       RADIOGRAPHIC STUDIES:  Dg Chest 2 View  05/18/2015   CLINICAL DATA:  Followup pneumonia.  Ex-smoker.  History of COPD.  EXAM: CHEST  2 VIEW  COMPARISON:  04/11/2015 and PET-CT dated 04/16/2015.  FINDINGS: Normal sized heart. Clear lungs. The lungs remain hyperexpanded. Right chest surgical clips are again demonstrated. Unremarkable bones.  IMPRESSION: No acute abnormality.  Stable changes of COPD.   Electronically Signed   By: Claudie Revering M.D.   On: 05/18/2015 10:20   Mr Jeri Cos YT Contrast  04/27/2015   CLINICAL DATA:  81 year old male with lung cancer. Evaluate for possibility of intracranial metastatic disease. Staging. History of hypertension. Initial encounter.  EXAM: MRI HEAD WITHOUT AND WITH CONTRAST  TECHNIQUE: Multiplanar, multiecho pulse sequences of the brain and surrounding structures were obtained without and with intravenous contrast.  CONTRAST:  66m MULTIHANCE GADOBENATE DIMEGLUMINE 529 MG/ML IV SOLN  COMPARISON:  None.  FINDINGS: No intracranial enhancing lesion or bony destructive lesion to suggest the presence of intracranial metastatic disease.  No acute infarct.  No intracranial hemorrhage.  Prominent small vessel disease type changes.  Global atrophy. Ventricular prominence felt to be related to atrophy rather hydrocephalus.  Major intracranial vascular structures are patent.  Cervical spondylotic changes with mild spinal stenosis and slight cord flattening C3-4 level. Transverse ligament hypertrophy.  Cervical medullary junction, pituitary region, pineal region and orbital structures unremarkable.  Minimal partial opacification mastoid air cells without obstructing lesion of the eustachian tube. Minimal mucosal thickening/ partial  opacification ethmoid sinus air cells.  IMPRESSION: No intracranial enhancing lesion or bony destructive lesion to suggest the presence of intracranial metastatic disease.  No acute infarct.  Prominent small vessel disease type changes.  Global atrophy.  Cervical spondylotic changes with mild spinal stenosis and slight cord flattening C3-4 level.  Minimal partial opacification mastoid air cells without obstructing lesion of the eustachian tube. Minimal mucosal thickening/ partial opacification ethmoid sinus air cells.   Electronically Signed   By: SGenia DelM.D.   On: 04/27/2015 08:50     ASSESSMENT/PLAN:  No problem-specific assessment & plan notes found for this encounter. The  patient is a pleasant 81 year old African American male recently diagnosed with stage IIA (T1a,N1,M0) non small cell lung cancer favoring adenocarcinoma. He is currently undergoing a course of concurrent chemoradiation.  He will continue with his course of concurrent chemoradiation as scheduled. He will make radiation oncology aware of his esophageal discomfort. He will follow up in 2 weeks for re-evaluation.  Carlton Adam, PA-C 05/25/2015     All questions were answered. The patient knows to call the clinic with any problems, questions or concerns. We can certainly see the patient much sooner if necessary.

## 2015-05-25 NOTE — Progress Notes (Signed)
Nutrition follow-up completed with patient being treated for lung cancer. Patient states he is eating a little bit more. Continues to drink Ensure Plus or boost +3-4 bottles daily. Weight increased slightly and documented as 97.9 pounds September 27, up from 96 pounds.  Nutrition diagnosis: Increased nutrition needs continue.  Intervention: Patient educated to continue strategies for increasing meal intake. Patient educated to continue Ensure Plus 4 times a day Patient declined coupons. Teach back method used.  Monitoring, evaluation, goals: Patient is working to increase calories and protein, and has had weight gain.  Next visit: Tuesday, October 4, during infusion.  **Disclaimer: This note was dictated with voice recognition software. Similar sounding words can inadvertently be transcribed and this note may contain transcription errors which may not have been corrected upon publication of note.**

## 2015-05-26 ENCOUNTER — Ambulatory Visit
Admission: RE | Admit: 2015-05-26 | Discharge: 2015-05-26 | Disposition: A | Discharge status: Home / Self Care | Payer: Medicare Other | Source: Ambulatory Visit | Attending: Radiation Oncology | Admitting: Radiation Oncology

## 2015-05-26 VITALS — BP 113/59 | HR 99 | Temp 97.7°F | Resp 20 | Wt 98.9 lb

## 2015-05-26 DIAGNOSIS — Z510 Encounter for antineoplastic radiation therapy: Secondary | ICD-10-CM | POA: Diagnosis not present

## 2015-05-26 DIAGNOSIS — C3411 Malignant neoplasm of upper lobe, right bronchus or lung: Secondary | ICD-10-CM

## 2015-05-26 MED ORDER — SUCRALFATE 1 G PO TABS: ORAL_TABLET | ORAL | Status: DC

## 2015-05-26 MED ORDER — HYDROCODONE-ACETAMINOPHEN 7.5-325 MG/15ML PO SOLN: 10.0000 mL | Freq: Four times a day (QID) | ORAL | Status: DC | PRN

## 2015-05-26 NOTE — Progress Notes (Signed)
Weekly Management Note Current Dose:36 Gy  Projected Dose:60 Gy   Narrative:  The patient presents for routine under treatment assessment.  CBCT/MVCT images/Port film x-rays were reviewed.  The chart was checked. No appetite. Drinking 3-4 boost/ensure per day. Painful swallowing and requests something stronger. Also burping.   Physical Findings:  Unchanged  Vitals:  Filed Vitals:   05/26/15 1645  BP: 113/59  Pulse: 99  Temp: 97.7 F (36.5 C)  Resp: 20   Weight:  Wt Readings from Last 3 Encounters:  05/26/15 98 lb 14.4 oz (44.861 kg)  05/25/15 97 lb 14.4 oz (44.407 kg)  05/18/15 96 lb (43.545 kg)   Lab Results  Component Value Date   WBC 2.8* 05/25/2015   HGB 11.8* 05/25/2015   HCT 36.1* 05/25/2015   MCV 82.3 05/25/2015   PLT 191 05/25/2015   Lab Results  Component Value Date   CREATININE 0.9 05/25/2015   BUN 20.1 05/25/2015   NA 139 05/25/2015   K 3.8 05/25/2015   CL 98* 04/11/2015   CO2 26 05/25/2015     Impression:  The patient is tolerating radiation.  Plan:  Continue treatment as planned.  Discussed dietary modifcations to increase calories and protein.Add carafate and hycet. Instructions about constipation given. Pepcid/PPI for burping.

## 2015-05-26 NOTE — Progress Notes (Signed)
Weekly assessment of radiation to right lung.completed 18 of 30 fractions.Having significant pain of esophagus.Posterior back mildly darker.applied eucerin lotion to back as very dry and flaky.significant fatiguel  BP 113/59 mmHg  Pulse 99  Temp(Src) 97.7 F (36.5 C)  Resp 20  Wt 98 lb 14.4 oz (44.861 kg) Wt Readings from Last 3 Encounters:  05/26/15 98 lb 14.4 oz (44.861 kg)  05/25/15 97 lb 14.4 oz (44.407 kg)  05/18/15 96 lb (43.545 kg)

## 2015-05-26 NOTE — Patient Instructions (Signed)
Continue your course of concurrent chemoradiation as scheduled Followup in 2 weeks 

## 2015-05-27 ENCOUNTER — Ambulatory Visit
Admission: RE | Admit: 2015-05-27 | Discharge: 2015-05-27 | Disposition: A | Discharge status: Home / Self Care | Payer: Medicare Other | Source: Ambulatory Visit | Attending: Radiation Oncology | Admitting: Radiation Oncology

## 2015-05-27 DIAGNOSIS — Z510 Encounter for antineoplastic radiation therapy: Secondary | ICD-10-CM | POA: Diagnosis not present

## 2015-05-28 ENCOUNTER — Ambulatory Visit
Admission: RE | Admit: 2015-05-28 | Discharge: 2015-05-28 | Disposition: A | Discharge status: Home / Self Care | Payer: Medicare Other | Source: Ambulatory Visit | Attending: Radiation Oncology | Admitting: Radiation Oncology

## 2015-05-28 DIAGNOSIS — Z510 Encounter for antineoplastic radiation therapy: Secondary | ICD-10-CM | POA: Diagnosis not present

## 2015-05-31 ENCOUNTER — Ambulatory Visit
Admission: RE | Admit: 2015-05-31 | Discharge: 2015-05-31 | Disposition: A | Discharge status: Home / Self Care | Payer: Medicare Other | Source: Ambulatory Visit | Attending: Radiation Oncology | Admitting: Radiation Oncology

## 2015-05-31 DIAGNOSIS — F17210 Nicotine dependence, cigarettes, uncomplicated: Secondary | ICD-10-CM | POA: Diagnosis not present

## 2015-05-31 DIAGNOSIS — Z510 Encounter for antineoplastic radiation therapy: Secondary | ICD-10-CM | POA: Diagnosis present

## 2015-05-31 DIAGNOSIS — C3411 Malignant neoplasm of upper lobe, right bronchus or lung: Secondary | ICD-10-CM | POA: Diagnosis present

## 2015-06-01 ENCOUNTER — Ambulatory Visit: Payer: Medicare Other

## 2015-06-01 ENCOUNTER — Ambulatory Visit: Payer: Medicare Other | Admitting: Nutrition

## 2015-06-01 ENCOUNTER — Ambulatory Visit: Payer: Medicare Other | Admitting: Internal Medicine

## 2015-06-01 ENCOUNTER — Other Ambulatory Visit: Payer: Medicare Other

## 2015-06-01 ENCOUNTER — Ambulatory Visit (HOSPITAL_BASED_OUTPATIENT_CLINIC_OR_DEPARTMENT_OTHER): Payer: Medicare Other

## 2015-06-01 ENCOUNTER — Other Ambulatory Visit: Payer: Self-pay | Admitting: Medical Oncology

## 2015-06-01 ENCOUNTER — Ambulatory Visit
Admission: RE | Admit: 2015-06-01 | Discharge: 2015-06-01 | Disposition: A | Discharge status: Home / Self Care | Payer: Medicare Other | Source: Ambulatory Visit | Attending: Radiation Oncology | Admitting: Radiation Oncology

## 2015-06-01 VITALS — HR 98 | Resp 20

## 2015-06-01 DIAGNOSIS — C3491 Malignant neoplasm of unspecified part of right bronchus or lung: Secondary | ICD-10-CM

## 2015-06-01 DIAGNOSIS — C3411 Malignant neoplasm of upper lobe, right bronchus or lung: Secondary | ICD-10-CM | POA: Diagnosis present

## 2015-06-01 LAB — CBC WITH DIFFERENTIAL/PLATELET
BASO%: 0.6 % (ref 0.0–2.0)
BASOS ABS: 0 10*3/uL (ref 0.0–0.1)
EOS%: 0.1 % (ref 0.0–7.0)
Eosinophils Absolute: 0 10*3/uL (ref 0.0–0.5)
HCT: 32.7 % — ABNORMAL LOW (ref 38.4–49.9)
HEMOGLOBIN: 10.9 g/dL — AB (ref 13.0–17.1)
LYMPH%: 9.8 % — AB (ref 14.0–49.0)
MCH: 27.4 pg (ref 27.2–33.4)
MCHC: 33.3 g/dL (ref 32.0–36.0)
MCV: 82.1 fL (ref 79.3–98.0)
MONO#: 0.5 10*3/uL (ref 0.1–0.9)
MONO%: 16 % — AB (ref 0.0–14.0)
NEUT#: 2.4 10*3/uL (ref 1.5–6.5)
NEUT%: 73.5 % (ref 39.0–75.0)
Platelets: 231 10*3/uL (ref 140–400)
RBC: 3.99 10*6/uL — AB (ref 4.20–5.82)
RDW: 15.5 % — AB (ref 11.0–14.6)
WBC: 3.2 10*3/uL — AB (ref 4.0–10.3)
lymph#: 0.3 10*3/uL — ABNORMAL LOW (ref 0.9–3.3)

## 2015-06-01 LAB — COMPREHENSIVE METABOLIC PANEL (CC13)
ALBUMIN: 3.2 g/dL — AB (ref 3.5–5.0)
ALT: 13 U/L (ref 0–55)
AST: 17 U/L (ref 5–34)
Alkaline Phosphatase: 63 U/L (ref 40–150)
Anion Gap: 10 mEq/L (ref 3–11)
BUN: 23 mg/dL (ref 7.0–26.0)
CHLORIDE: 102 meq/L (ref 98–109)
CO2: 27 meq/L (ref 22–29)
Calcium: 9.5 mg/dL (ref 8.4–10.4)
Creatinine: 0.9 mg/dL (ref 0.7–1.3)
EGFR: 88 mL/min/{1.73_m2} — ABNORMAL LOW (ref >90)
GLUCOSE: 112 mg/dL (ref 70–140)
POTASSIUM: 3.7 meq/L (ref 3.5–5.1)
SODIUM: 139 meq/L (ref 136–145)
Total Bilirubin: 1.64 mg/dL — ABNORMAL HIGH (ref 0.20–1.20)
Total Protein: 7.1 g/dL (ref 6.4–8.3)

## 2015-06-01 MED ORDER — SODIUM CHLORIDE 0.9 % IJ SOLN
10.0000 mL | INTRAMUSCULAR | Status: DC | PRN
  Filled 2015-06-01: qty 10

## 2015-06-01 MED ORDER — SODIUM CHLORIDE 0.9 % IJ SOLN
3.0000 mL | INTRAMUSCULAR | Status: DC | PRN
  Filled 2015-06-01: qty 10

## 2015-06-01 MED ORDER — FAMOTIDINE IN NACL 20-0.9 MG/50ML-% IV SOLN
INTRAVENOUS | Status: AC
  Filled 2015-06-01: qty 50

## 2015-06-01 MED ORDER — HEPARIN SOD (PORK) LOCK FLUSH 100 UNIT/ML IV SOLN
500.0000 [IU] | Freq: Once | INTRAVENOUS | Status: DC | PRN
  Filled 2015-06-01: qty 5

## 2015-06-01 MED ORDER — HEPARIN SOD (PORK) LOCK FLUSH 100 UNIT/ML IV SOLN
250.0000 [IU] | Freq: Once | INTRAVENOUS | Status: DC | PRN
  Filled 2015-06-01: qty 5

## 2015-06-01 MED ORDER — SODIUM CHLORIDE 0.9 % IV SOLN
Freq: Once | INTRAVENOUS | Status: DC
  Filled 2015-06-01: qty 8

## 2015-06-01 MED ORDER — SODIUM CHLORIDE 0.9 % IV SOLN
120.4000 mg | Freq: Once | INTRAVENOUS | Status: DC
  Filled 2015-06-01: qty 12

## 2015-06-01 MED ORDER — SODIUM CHLORIDE 0.9 % IV SOLN: Freq: Once | INTRAVENOUS | Status: DC

## 2015-06-01 MED ORDER — PACLITAXEL CHEMO INJECTION 300 MG/50ML
45.0000 mg/m2 | Freq: Once | INTRAVENOUS | Status: DC
  Filled 2015-06-01: qty 11

## 2015-06-01 MED ORDER — ALTEPLASE 2 MG IJ SOLR
2.0000 mg | Freq: Once | INTRAMUSCULAR | Status: DC | PRN
  Filled 2015-06-01: qty 2

## 2015-06-01 MED ORDER — FAMOTIDINE IN NACL 20-0.9 MG/50ML-% IV SOLN: 20.0000 mg | Freq: Once | INTRAVENOUS | Status: DC

## 2015-06-01 MED ORDER — DIPHENHYDRAMINE HCL 50 MG/ML IJ SOLN
50.0000 mg | Freq: Once | INTRAMUSCULAR | Status: AC
  Administered 2015-06-01: 50 mg via INTRAVENOUS

## 2015-06-01 MED ORDER — DIPHENHYDRAMINE HCL 50 MG/ML IJ SOLN
INTRAMUSCULAR | Status: AC
  Filled 2015-06-01: qty 1

## 2015-06-01 NOTE — Progress Notes (Signed)
Nutrition follow-up completed with patient being treated for lung cancer. Patient states he did not have a good weekend and spent more time in bed. He tries to drink 4 Ensure Plus or boost plus a day. Last weight documented as 98.9 pounds on September 28. Patient reports some painful swallowing.  Nutrition diagnosis: Increased nutrient needs continue.  Intervention: Encouraged patient to continue increased calories and protein to promote weight gain. Discussed importance of adequate fluid intake. Provided one complementary case of Ensure Plus. Patient should continue oral nutrition supplements 4 times a day Teach back method used.  Monitoring, evaluation, goals: Patient will tolerate oral intake at meals plus Ensure Plus 4 times a day to promote weight gain.  Next visit: I will follow patient as needed.  **Disclaimer: This note was dictated with voice recognition software. Similar sounding words can inadvertently be transcribed and this note may contain transcription errors which may not have been corrected upon publication of note.**

## 2015-06-01 NOTE — Progress Notes (Signed)
Pt came in to the chemo room at 130pm- noted SOB wearing O2.  Per assessment pt stated several concerns since last treatment including decreased appetite- and fluid intake.  Frank Garcia stated ongoing and increased " just tired "- " more bad days " " stayed in bed all day Sunday "  Denies any nausea.  Pain is well controlled.  Mildly constipated with last BM Sunday.  Urinated well but noted today " urine is darker ".  Overall pt's main concern was increased fatigue and decreased appetite.  Vital signs obtained and MD notified due to above symptoms.  Requested per MD to start IV and MD will review and assess pt.  At 1530 informed per MD to proceed with chemo treatment.  IV line infiltrated prior to giving premeds.  Per discussion with pt of need to restart IV - as well as current time and need to proceed with radiation- arranged for pt to receive chemo tomorrow .  Radiation contacted that pt was ready for radiation.  At 1700 radiation not received due to machine down.  Pt stated he would like to go home at this time.  Appointments reviewed for tomorrow and pt d/ced home.  [name] in [employer] notified of above situation.  MD made aware.

## 2015-06-01 NOTE — Progress Notes (Signed)
Per Julien Nordmann it is okay to treat pt to day with chemo and today's lab

## 2015-06-02 ENCOUNTER — Ambulatory Visit (HOSPITAL_BASED_OUTPATIENT_CLINIC_OR_DEPARTMENT_OTHER): Payer: Medicare Other

## 2015-06-02 ENCOUNTER — Ambulatory Visit
Admission: RE | Admit: 2015-06-02 | Discharge: 2015-06-02 | Disposition: A | Discharge status: Home / Self Care | Payer: Medicare Other | Source: Ambulatory Visit | Attending: Radiation Oncology | Admitting: Radiation Oncology

## 2015-06-02 DIAGNOSIS — Z5111 Encounter for antineoplastic chemotherapy: Secondary | ICD-10-CM

## 2015-06-02 DIAGNOSIS — Z510 Encounter for antineoplastic radiation therapy: Secondary | ICD-10-CM | POA: Diagnosis not present

## 2015-06-02 DIAGNOSIS — C3491 Malignant neoplasm of unspecified part of right bronchus or lung: Secondary | ICD-10-CM | POA: Diagnosis not present

## 2015-06-02 MED ORDER — FAMOTIDINE IN NACL 20-0.9 MG/50ML-% IV SOLN
INTRAVENOUS | Status: AC
  Filled 2015-06-02: qty 50

## 2015-06-02 MED ORDER — SODIUM CHLORIDE 0.9 % IV SOLN
120.4000 mg | Freq: Once | INTRAVENOUS | Status: AC
  Administered 2015-06-02: 120 mg via INTRAVENOUS
  Filled 2015-06-02: qty 12

## 2015-06-02 MED ORDER — DIPHENHYDRAMINE HCL 50 MG/ML IJ SOLN
INTRAMUSCULAR | Status: AC
  Filled 2015-06-02: qty 1

## 2015-06-02 MED ORDER — SODIUM CHLORIDE 0.9 % IV SOLN
Freq: Once | INTRAVENOUS | Status: AC
  Administered 2015-06-02: 14:00:00 via INTRAVENOUS

## 2015-06-02 MED ORDER — SODIUM CHLORIDE 0.9 % IV SOLN
Freq: Once | INTRAVENOUS | Status: AC
  Administered 2015-06-02: 14:00:00 via INTRAVENOUS
  Filled 2015-06-02: qty 8

## 2015-06-02 MED ORDER — DIPHENHYDRAMINE HCL 50 MG/ML IJ SOLN
50.0000 mg | Freq: Once | INTRAMUSCULAR | Status: AC
  Administered 2015-06-02: 50 mg via INTRAVENOUS

## 2015-06-02 MED ORDER — PACLITAXEL CHEMO INJECTION 300 MG/50ML
45.0000 mg/m2 | Freq: Once | INTRAVENOUS | Status: AC
  Administered 2015-06-02: 66 mg via INTRAVENOUS
  Filled 2015-06-02: qty 11

## 2015-06-02 MED ORDER — FAMOTIDINE IN NACL 20-0.9 MG/50ML-% IV SOLN
20.0000 mg | Freq: Once | INTRAVENOUS | Status: AC
  Administered 2015-06-02: 20 mg via INTRAVENOUS

## 2015-06-02 NOTE — Patient Instructions (Addendum)
[facility] Discharge Instructions for Patients Receiving Chemotherapy  Today you received the following chemotherapy agents: Taxol, Carboplatin  To help prevent nausea and vomiting after your treatment, we encourage you to take your nausea medication as prescribed by your physician.   If you develop nausea and vomiting that is not controlled by your nausea medication, call the clinic.   BELOW ARE SYMPTOMS THAT SHOULD BE REPORTED IMMEDIATELY:  *FEVER GREATER THAN 100.5 F  *CHILLS WITH OR WITHOUT FEVER  NAUSEA AND VOMITING THAT IS NOT CONTROLLED WITH YOUR NAUSEA MEDICATION  *UNUSUAL SHORTNESS OF BREATH  *UNUSUAL BRUISING OR BLEEDING  TENDERNESS IN MOUTH AND THROAT WITH OR WITHOUT PRESENCE OF ULCERS  *URINARY PROBLEMS  *BOWEL PROBLEMS  UNUSUAL RASH Items with * indicate a potential emergency and should be followed up as soon as possible.  Feel free to call the clinic you have any questions or concerns. The clinic phone number is [phone].  Please show the CHEMO ALERT CARD at check-in to the Emergency Department and triage nurse.   

## 2015-06-03 ENCOUNTER — Ambulatory Visit
Admission: RE | Admit: 2015-06-03 | Discharge: 2015-06-03 | Disposition: A | Discharge status: Home / Self Care | Payer: Medicare Other | Source: Ambulatory Visit | Attending: Radiation Oncology | Admitting: Radiation Oncology

## 2015-06-03 VITALS — BP 105/51 | HR 89 | Temp 97.6°F | Wt 97.9 lb

## 2015-06-03 DIAGNOSIS — Z510 Encounter for antineoplastic radiation therapy: Secondary | ICD-10-CM | POA: Diagnosis not present

## 2015-06-03 DIAGNOSIS — C3411 Malignant neoplasm of upper lobe, right bronchus or lung: Secondary | ICD-10-CM

## 2015-06-03 NOTE — Progress Notes (Signed)
Weekly Management Note Current Dose:46 Gy  Projected Dose:60 Gy   Narrative:  The patient presents for routine under treatment assessment.  CBCT/MVCT images/Port film x-rays were reviewed.  The chart was checked. Weekly assessment of radiation to right lung. He has completed 23/30 treatments. He has mild irritation of his esophagus that is improved with carafate. He has an increase in fatigue. He had chemotherapy on 06/02/15. Met with dietician.   Physical Findings: Pleasant man who appears his stated age. Nasal cannula in place with 3L of oxygen. No skin changes anteriorly or posteriorly.   Vitals:  Filed Vitals:   06/03/15 1649  BP: 105/51  Pulse: 89  Temp: 97.6 F (36.4 C)   Weight:  Wt Readings from Last 3 Encounters:  06/03/15 97 lb 14.4 oz (44.407 kg)  05/26/15 98 lb 14.4 oz (44.861 kg)  05/25/15 97 lb 14.4 oz (44.407 kg)   Lab Results  Component Value Date   WBC 3.2* 06/01/2015   HGB 10.9* 06/01/2015   HCT 32.7* 06/01/2015   MCV 82.1 06/01/2015   PLT 231 06/01/2015   Lab Results  Component Value Date   CREATININE 0.9 06/01/2015   BUN 23.0 06/01/2015   NA 139 06/01/2015   K 3.7 06/01/2015   CL 98* 04/11/2015   CO2 27 06/01/2015     Impression:  The patient is tolerating radiation.  Plan:  Continue treatment as planned. Continue carafate.   This document serves as a record of services personally performed by Thea Silversmith, MD. It was created on her behalf by Darcus Austin, a trained medical scribe. The creation of this record is based on the scribe's personal observations and the provider's statements to them. This document has been checked and approved by the attending provider.

## 2015-06-04 ENCOUNTER — Ambulatory Visit
Admission: RE | Admit: 2015-06-04 | Discharge: 2015-06-04 | Disposition: A | Discharge status: Home / Self Care | Payer: Medicare Other | Source: Ambulatory Visit | Attending: Radiation Oncology | Admitting: Radiation Oncology

## 2015-06-07 ENCOUNTER — Telehealth: Payer: Self-pay | Admitting: Internal Medicine

## 2015-06-07 ENCOUNTER — Ambulatory Visit
Admission: RE | Admit: 2015-06-07 | Discharge: 2015-06-07 | Disposition: A | Discharge status: Home / Self Care | Payer: Medicare Other | Source: Ambulatory Visit | Attending: Radiation Oncology | Admitting: Radiation Oncology

## 2015-06-07 ENCOUNTER — Inpatient Hospital Stay (HOSPITAL_COMMUNITY)
Admission: AD | Admit: 2015-06-07 | Discharge: 2015-06-09 | DRG: 190 | Disposition: A | Discharge status: Home / Self Care | Payer: Medicare Other | Source: Ambulatory Visit | Attending: Pulmonary Disease | Admitting: Pulmonary Disease

## 2015-06-07 ENCOUNTER — Ambulatory Visit (INDEPENDENT_AMBULATORY_CARE_PROVIDER_SITE_OTHER)
Admission: RE | Admit: 2015-06-07 | Discharge: 2015-06-07 | Disposition: A | Discharge status: Home / Self Care | Payer: Medicare Other | Source: Ambulatory Visit | Attending: Pulmonary Disease | Admitting: Pulmonary Disease

## 2015-06-07 ENCOUNTER — Telehealth: Payer: Self-pay | Admitting: Pulmonary Disease

## 2015-06-07 ENCOUNTER — Ambulatory Visit (INDEPENDENT_AMBULATORY_CARE_PROVIDER_SITE_OTHER): Payer: Medicare Other | Admitting: Pulmonary Disease

## 2015-06-07 ENCOUNTER — Encounter: Payer: Self-pay | Admitting: Pulmonary Disease

## 2015-06-07 ENCOUNTER — Inpatient Hospital Stay (HOSPITAL_COMMUNITY): Payer: Medicare Other

## 2015-06-07 VITALS — BP 126/66 | HR 106 | Temp 97.5°F | Ht 67.0 in | Wt 93.4 lb

## 2015-06-07 DIAGNOSIS — Z87891 Personal history of nicotine dependence: Secondary | ICD-10-CM | POA: Diagnosis not present

## 2015-06-07 DIAGNOSIS — E43 Unspecified severe protein-calorie malnutrition: Secondary | ICD-10-CM | POA: Diagnosis present

## 2015-06-07 DIAGNOSIS — J45909 Unspecified asthma, uncomplicated: Secondary | ICD-10-CM | POA: Diagnosis present

## 2015-06-07 DIAGNOSIS — C3490 Malignant neoplasm of unspecified part of unspecified bronchus or lung: Secondary | ICD-10-CM | POA: Diagnosis present

## 2015-06-07 DIAGNOSIS — I2510 Atherosclerotic heart disease of native coronary artery without angina pectoris: Secondary | ICD-10-CM | POA: Diagnosis present

## 2015-06-07 DIAGNOSIS — Z923 Personal history of irradiation: Secondary | ICD-10-CM

## 2015-06-07 DIAGNOSIS — I10 Essential (primary) hypertension: Secondary | ICD-10-CM | POA: Diagnosis present

## 2015-06-07 DIAGNOSIS — Z79899 Other long term (current) drug therapy: Secondary | ICD-10-CM | POA: Diagnosis not present

## 2015-06-07 DIAGNOSIS — C3491 Malignant neoplasm of unspecified part of right bronchus or lung: Secondary | ICD-10-CM

## 2015-06-07 DIAGNOSIS — Z9981 Dependence on supplemental oxygen: Secondary | ICD-10-CM

## 2015-06-07 DIAGNOSIS — J189 Pneumonia, unspecified organism: Secondary | ICD-10-CM

## 2015-06-07 DIAGNOSIS — K209 Esophagitis, unspecified: Secondary | ICD-10-CM | POA: Diagnosis present

## 2015-06-07 DIAGNOSIS — M1990 Unspecified osteoarthritis, unspecified site: Secondary | ICD-10-CM | POA: Diagnosis present

## 2015-06-07 DIAGNOSIS — Z9221 Personal history of antineoplastic chemotherapy: Secondary | ICD-10-CM

## 2015-06-07 DIAGNOSIS — Z681 Body mass index (BMI) 19 or less, adult: Secondary | ICD-10-CM

## 2015-06-07 DIAGNOSIS — Y95 Nosocomial condition: Secondary | ICD-10-CM | POA: Diagnosis present

## 2015-06-07 DIAGNOSIS — N400 Enlarged prostate without lower urinary tract symptoms: Secondary | ICD-10-CM | POA: Diagnosis present

## 2015-06-07 DIAGNOSIS — J440 Chronic obstructive pulmonary disease with acute lower respiratory infection: Principal | ICD-10-CM | POA: Diagnosis present

## 2015-06-07 DIAGNOSIS — J449 Chronic obstructive pulmonary disease, unspecified: Secondary | ICD-10-CM

## 2015-06-07 DIAGNOSIS — J441 Chronic obstructive pulmonary disease with (acute) exacerbation: Secondary | ICD-10-CM | POA: Diagnosis present

## 2015-06-07 LAB — EXPECTORATED SPUTUM ASSESSMENT W REFEX TO RESP CULTURE

## 2015-06-07 LAB — CBC WITH DIFFERENTIAL/PLATELET
Basophils Absolute: 0 10*3/uL (ref 0.0–0.1)
Basophils Relative: 0 %
EOS PCT: 0 %
Eosinophils Absolute: 0 10*3/uL (ref 0.0–0.7)
HEMATOCRIT: 32.8 % — AB (ref 39.0–52.0)
Hemoglobin: 10.8 g/dL — ABNORMAL LOW (ref 13.0–17.0)
LYMPHS ABS: 0.4 10*3/uL — AB (ref 0.7–4.0)
LYMPHS PCT: 5 %
MCH: 27.2 pg (ref 26.0–34.0)
MCHC: 32.9 g/dL (ref 30.0–36.0)
MCV: 82.6 fL (ref 78.0–100.0)
Monocytes Absolute: 0.2 10*3/uL (ref 0.1–1.0)
Monocytes Relative: 3 %
Neutro Abs: 6.6 10*3/uL (ref 1.7–7.7)
Neutrophils Relative %: 92 %
PLATELETS: 245 10*3/uL (ref 150–400)
RBC: 3.97 MIL/uL — AB (ref 4.22–5.81)
RDW: 16.7 % — ABNORMAL HIGH (ref 11.5–15.5)
WBC: 7.2 10*3/uL (ref 4.0–10.5)

## 2015-06-07 LAB — COMPREHENSIVE METABOLIC PANEL
ALBUMIN: 3.5 g/dL (ref 3.5–5.0)
ALT: 14 U/L — ABNORMAL LOW (ref 17–63)
AST: 30 U/L (ref 15–41)
Alkaline Phosphatase: 69 U/L (ref 38–126)
Anion gap: 13 (ref 5–15)
BUN: 26 mg/dL — AB (ref 6–20)
CHLORIDE: 98 mmol/L — AB (ref 101–111)
CO2: 28 mmol/L (ref 22–32)
Calcium: 9.4 mg/dL (ref 8.9–10.3)
Creatinine, Ser: 1.06 mg/dL (ref 0.61–1.24)
GFR calc Af Amer: >60 mL/min (ref >60)
GFR calc non Af Amer: >60 mL/min (ref >60)
GLUCOSE: 123 mg/dL — AB (ref 65–99)
POTASSIUM: 5.2 mmol/L — AB (ref 3.5–5.1)
Sodium: 139 mmol/L (ref 135–145)
Total Bilirubin: 2.5 mg/dL — ABNORMAL HIGH (ref 0.3–1.2)
Total Protein: 7.8 g/dL (ref 6.5–8.1)

## 2015-06-07 LAB — EXPECTORATED SPUTUM ASSESSMENT W GRAM STAIN, RFLX TO RESP C

## 2015-06-07 LAB — BRAIN NATRIURETIC PEPTIDE: B NATRIURETIC PEPTIDE 5: 56.7 pg/mL (ref 0.0–100.0)

## 2015-06-07 MED ORDER — ALBUTEROL SULFATE (2.5 MG/3ML) 0.083% IN NEBU: 2.5000 mg | INHALATION_SOLUTION | RESPIRATORY_TRACT | Status: DC

## 2015-06-07 MED ORDER — ENOXAPARIN SODIUM 40 MG/0.4ML ~~LOC~~ SOLN
40.0000 mg | SUBCUTANEOUS | Status: DC
  Administered 2015-06-07: 40 mg via SUBCUTANEOUS
  Filled 2015-06-07: qty 0.4

## 2015-06-07 MED ORDER — GUAIFENESIN ER 600 MG PO TB12
600.0000 mg | ORAL_TABLET | Freq: Two times a day (BID) | ORAL | Status: DC
  Administered 2015-06-07 – 2015-06-09 (×4): 600 mg via ORAL
  Filled 2015-06-07 (×6): qty 1

## 2015-06-07 MED ORDER — TAMSULOSIN HCL 0.4 MG PO CAPS
0.4000 mg | ORAL_CAPSULE | Freq: Every day | ORAL | Status: DC
  Administered 2015-06-08: 0.4 mg via ORAL
  Filled 2015-06-07 (×3): qty 1

## 2015-06-07 MED ORDER — SODIUM CHLORIDE 0.9 % IJ SOLN: 3.0000 mL | INTRAMUSCULAR | Status: DC | PRN

## 2015-06-07 MED ORDER — PROCHLORPERAZINE MALEATE 10 MG PO TABS: 10.0000 mg | ORAL_TABLET | Freq: Four times a day (QID) | ORAL | Status: DC | PRN

## 2015-06-07 MED ORDER — PIPERACILLIN-TAZOBACTAM 3.375 G IVPB
3.3750 g | Freq: Three times a day (TID) | INTRAVENOUS | Status: DC
  Administered 2015-06-07 – 2015-06-09 (×5): 3.375 g via INTRAVENOUS
  Filled 2015-06-07 (×6): qty 50

## 2015-06-07 MED ORDER — ENOXAPARIN SODIUM 30 MG/0.3ML ~~LOC~~ SOLN
30.0000 mg | SUBCUTANEOUS | Status: DC
  Administered 2015-06-08: 30 mg via SUBCUTANEOUS
  Filled 2015-06-07 (×2): qty 0.3

## 2015-06-07 MED ORDER — LEVOFLOXACIN IN D5W 750 MG/150ML IV SOLN
750.0000 mg | INTRAVENOUS | Status: DC
  Administered 2015-06-07: 750 mg via INTRAVENOUS
  Filled 2015-06-07: qty 150

## 2015-06-07 MED ORDER — ALBUTEROL SULFATE (2.5 MG/3ML) 0.083% IN NEBU: 2.5000 mg | INHALATION_SOLUTION | RESPIRATORY_TRACT | Status: DC | PRN

## 2015-06-07 MED ORDER — ACETAMINOPHEN 650 MG RE SUPP: 650.0000 mg | Freq: Four times a day (QID) | RECTAL | Status: DC | PRN

## 2015-06-07 MED ORDER — SODIUM CHLORIDE 0.9 % IV SOLN
INTRAVENOUS | Status: AC
  Administered 2015-06-07: 19:00:00 via INTRAVENOUS

## 2015-06-07 MED ORDER — METHYLPREDNISOLONE SODIUM SUCC 125 MG IJ SOLR
60.0000 mg | Freq: Two times a day (BID) | INTRAMUSCULAR | Status: DC
  Administered 2015-06-07 – 2015-06-08 (×2): 60 mg via INTRAVENOUS
  Filled 2015-06-07 (×4): qty 0.96

## 2015-06-07 MED ORDER — ACETAMINOPHEN 325 MG PO TABS: 650.0000 mg | ORAL_TABLET | Freq: Four times a day (QID) | ORAL | Status: DC | PRN

## 2015-06-07 MED ORDER — POLYETHYLENE GLYCOL 3350 17 G PO PACK
17.0000 g | PACK | Freq: Every day | ORAL | Status: DC
  Administered 2015-06-08 – 2015-06-09 (×2): 17 g via ORAL
  Filled 2015-06-07 (×2): qty 1

## 2015-06-07 MED ORDER — SODIUM CHLORIDE 0.9 % IJ SOLN
3.0000 mL | Freq: Two times a day (BID) | INTRAMUSCULAR | Status: DC
  Administered 2015-06-07 – 2015-06-09 (×3): 3 mL via INTRAVENOUS

## 2015-06-07 MED ORDER — IPRATROPIUM BROMIDE 0.02 % IN SOLN: 0.5000 mg | RESPIRATORY_TRACT | Status: DC

## 2015-06-07 MED ORDER — SODIUM CHLORIDE 0.9 % IV SOLN: 250.0000 mL | INTRAVENOUS | Status: DC | PRN

## 2015-06-07 MED ORDER — FLUTICASONE PROPIONATE 50 MCG/ACT NA SUSP
2.0000 | Freq: Every day | NASAL | Status: DC
  Administered 2015-06-08 – 2015-06-09 (×2): 2 via NASAL
  Filled 2015-06-07: qty 16

## 2015-06-07 MED ORDER — HYDROCODONE-ACETAMINOPHEN 7.5-325 MG/15ML PO SOLN: 10.0000 mL | Freq: Four times a day (QID) | ORAL | Status: DC | PRN

## 2015-06-07 MED ORDER — PANTOPRAZOLE SODIUM 40 MG PO TBEC
40.0000 mg | DELAYED_RELEASE_TABLET | Freq: Every day | ORAL | Status: DC
  Administered 2015-06-08: 40 mg via ORAL
  Filled 2015-06-07 (×2): qty 1

## 2015-06-07 MED ORDER — VANCOMYCIN HCL IN DEXTROSE 1-5 GM/200ML-% IV SOLN
1000.0000 mg | INTRAVENOUS | Status: DC
  Administered 2015-06-07: 1000 mg via INTRAVENOUS
  Filled 2015-06-07: qty 200

## 2015-06-07 MED ORDER — IPRATROPIUM-ALBUTEROL 0.5-2.5 (3) MG/3ML IN SOLN
3.0000 mL | RESPIRATORY_TRACT | Status: DC
  Administered 2015-06-07 – 2015-06-09 (×8): 3 mL via RESPIRATORY_TRACT
  Filled 2015-06-07 (×9): qty 3

## 2015-06-07 MED ORDER — MOMETASONE FURO-FORMOTEROL FUM 200-5 MCG/ACT IN AERO
2.0000 | INHALATION_SPRAY | Freq: Two times a day (BID) | RESPIRATORY_TRACT | Status: DC
  Administered 2015-06-08 – 2015-06-09 (×3): 2 via RESPIRATORY_TRACT
  Filled 2015-06-07 (×2): qty 8.8

## 2015-06-07 NOTE — Progress Notes (Signed)
Subjective:    Patient ID: Frank Garcia, male    DOB: 05/19/1929, 81 y.o.   MRN: [id number]  HPI Chief Complaint  Patient presents with  . Acute Visit    MR pt  treated for adenocarcinoma here for an acute visit. pt c/o prod cough clear in color and chest tightness. pt states hes had this chest pain for about a week. pt using O2 5 LPM pulse during the day and 2LPM continuous at night.     He is undergoing radiation and chemotherapy. He has been sick for a week: dyspnea, cough, mucus production.  Has pain in his right side when he coughs.   Past Medical History  Diagnosis Date  . BPH (benign prostatic hypertrophy)     takes Rapaflo daily  . Chronic airway obstruction, not elsewhere classified   . Asthma     uses Spiriva and Dulera daily  . Emphysema lung ([place])   . Chronic bronchitis ([place])   . Shortness of breath     Albuterol inhaler as needed;lying and exertion  . Pneumonia     "a few times" (08/26/2014)  . Cough     productive but without fever  . Arthritis   . Joint pain   . Cataracts, bilateral     immature  . History of shingles   . Cancer ([place])   . Hypertension       Review of Systems  Constitutional: Positive for fatigue. Negative for fever and chills.  HENT: Negative for nosebleeds, postnasal drip and rhinorrhea.   Respiratory: Positive for cough, shortness of breath and wheezing.   Cardiovascular: Positive for chest pain. Negative for palpitations and leg swelling.       Objective:   Physical Exam Filed Vitals:   06/07/15 1448 06/07/15 1450  BP: 126/66 126/66  Pulse:  106  Temp: 97.5 F (36.4 C) 97.5 F (36.4 C)  TempSrc:  Oral  Height:  '5\' 7"'$  (1.702 m)  Weight: 93 lb 6.4 oz (42.366 kg) 93 lb 6.4 oz (42.366 kg)  SpO2: 97% 97%   RA  Gen: chronically ill appearing, moderate distress HENT: OP clear, TM's clear, neck supple PULM: poor air movement, wheezing noted CV: Tachycardic, no mgr, trace edema GI: BS+, soft, nontender Derm: no cyanosis  or rash Psyche: normal mood and affect  Chest x-ray from 2 weeks ago reviewed where he had severe emphysema and a chronic right midlung abnormality consistent with his known history of malignancy.     Assessment & Plan:  COPD, very severe [facility]) Mr. Nicasio is having another exacerbation of his very severe COPD. He is in mild to moderate respiratory distress here in the office and considering his advanced age, severe COPD, malignancy, and his current chemotherapy treatment I do not feel comfortable discharging him home.  Plan: Chest x-ray to ensure there is nothing else going on like a pneumothorax Admit to the hospital Pulmonary service Solu-Medrol 60 mg IV every 12 hours DuoNeb scheduled every 4 hours Continue Dulera twice a day IV Levaquin 500 mg daily  Adenocarcinoma of lung, stage 2 ([place]) He will go for radiation treatment today on his way to the hospital.     Current outpatient prescriptions:  .  albuterol (PROVENTIL HFA;VENTOLIN HFA) 108 (90 BASE) MCG/ACT inhaler, Inhale 2 puffs into the lungs every 4 (four) hours as needed for wheezing or shortness of breath., Disp: 1 Inhaler, Rfl: 3 .  albuterol (PROVENTIL) (2.5 MG/3ML) 0.083% nebulizer solution, Take 3 mLs (2.5 mg  total) by nebulization every 2 (two) hours as needed for wheezing or shortness of breath., Disp: 540 mL, Rfl: 4 .  fluticasone (FLONASE) 50 MCG/ACT nasal spray, Place 2 sprays into both nostrils daily., Disp: 16 g, Rfl: 5 .  guaiFENesin (MUCINEX) 600 MG 12 hr tablet, Take 1 tablet (600 mg total) by mouth 2 (two) times daily., Disp: 30 tablet, Rfl: 0 .  HYDROcodone-acetaminophen (HYCET) 7.5-325 mg/15 ml solution, Take 10 mLs by mouth 4 (four) times daily as needed for moderate pain., Disp: 473 mL, Rfl: 0 .  HYDROcodone-homatropine (HYDROMET) 5-1.5 MG/5ML syrup, Take 2.5 mLs by mouth at bedtime as needed., Disp: 240 mL, Rfl: 0 .  mometasone-formoterol (DULERA) 200-5 MCG/ACT AERO, Inhale 2 puffs into the lungs 2 (two)  times daily., Disp: 2 Inhaler, Rfl: 0 .  Nutritional Supplements (ENSURE HIGH PROTEIN) LIQD, Take 1 Bottle by mouth 3 (three) times daily. To be taken 1 bottle three times a day between meals; not as meal substitution.., Disp: 21330 mL, Rfl: 3 .  polyethylene glycol (MIRALAX / GLYCOLAX) packet, Take 17 g by mouth daily. Hold for diarrhea, Disp: 28 each, Rfl: 0 .  prochlorperazine (COMPAZINE) 10 MG tablet, Take 1 tablet (10 mg total) by mouth every 6 (six) hours as needed for nausea or vomiting., Disp: 30 tablet, Rfl: 0 .  RAPAFLO 8 MG CAPS capsule, Take 1 capsule by mouth daily., Disp: , Rfl:  .  SPIRIVA RESPIMAT 2.5 MCG/ACT AERS, INHALE 2 PUFFS INTO THE LUNGS DAILY., Disp: 4 g, Rfl: 2 .  sucralfate (CARAFATE) 1 G tablet, Take 1 tab and dissolve in 6 oz water tid prn for swallowing., Disp: 60 tablet, Rfl: 1 .  Tiotropium Bromide Monohydrate (SPIRIVA RESPIMAT) 2.5 MCG/ACT AERS, Inhale 2 puffs into the lungs daily., Disp: 3 Inhaler, Rfl: 1

## 2015-06-07 NOTE — Telephone Encounter (Signed)
I called his daughter to let her know that he has pneumonia, not just a COPD exacerbation.  She voiced understanding.

## 2015-06-07 NOTE — H&P (Signed)
PULMONARY / CRITICAL CARE MEDICINE   Name: Frank Garcia MRN: [id number] DOB: May 06, 1929    ADMISSION DATE:  06/07/2015 CONSULTATION DATE:  06/07/2015  REFERRING MD :  Lake Bells  CHIEF COMPLAINT:  Shortness of breath  INITIAL PRESENTATION: 81 year old male with adenocarcinoma and very severe COPD was admitted from the pulmonary office on 06/07/2015 with a COPD exacerbation and possibly pneumonia.  STUDIES:    SIGNIFICANT EVENTS:    HISTORY OF PRESENT ILLNESS:  This is a pleasant 81 year old male who has very severe COPD and stage II adenocarcinoma currently undergoing treatment with chemotherapy and radiation therapy who came to the pulmonary office on 06/07/2015 complaining of worsening shortness of breath again. He said that for 1 week he's been having increasing shortness of breath, chest pain on the right side with breathing, cough productive of clear mucus. He denies fevers or chills or sick contacts. He denies leg swelling or any extremity pain. He says that his chemotherapy and radiation therapy has been going okay. He was scheduled to have radiation therapy today. He recently was hospitalized for pneumonia and followed up in our clinic earlier this month where he had a chest x-ray that showed normalization of his previous pneumonia.  PAST MEDICAL HISTORY :   has a past medical history of BPH (benign prostatic hypertrophy); Chronic airway obstruction, not elsewhere classified; Asthma; Emphysema lung ([place]); Chronic bronchitis ([place]); Shortness of breath; Pneumonia; Cough; Arthritis; Joint pain; Cataracts, bilateral; History of shingles; Cancer ([place]); and Hypertension.  has past surgical history that includes No past surgeries and Video bronchoscopy with endobronchial navigation (N/A, 03/10/2015). Prior to Admission medications   Medication Sig Start Date End Date Taking? Authorizing Provider  albuterol (PROVENTIL HFA;VENTOLIN HFA) 108 (90 BASE) MCG/ACT inhaler Inhale 2 puffs into the  lungs every 4 (four) hours as needed for wheezing or shortness of breath. 01/27/14  Yes Brand Males, MD  albuterol (PROVENTIL) (2.5 MG/3ML) 0.083% nebulizer solution Take 3 mLs (2.5 mg total) by nebulization every 2 (two) hours as needed for wheezing or shortness of breath. 01/27/14  Yes Brand Males, MD  fluticasone (FLONASE) 50 MCG/ACT nasal spray Place 2 sprays into both nostrils daily. Patient taking differently: Place 2 sprays into both nostrils daily as needed for allergies.  01/11/15  Yes Brand Males, MD  guaiFENesin (MUCINEX) 600 MG 12 hr tablet Take 1 tablet (600 mg total) by mouth 2 (two) times daily. Patient taking differently: Take 600 mg by mouth 2 (two) times daily as needed for cough or to loosen phlegm.  04/12/15  Yes Barton Dubois, MD  HYDROcodone-acetaminophen (HYCET) 7.5-325 mg/15 ml solution Take 10 mLs by mouth 4 (four) times daily as needed for moderate pain. 05/26/15  Yes Thea Silversmith, MD  HYDROcodone-homatropine (HYDROMET) 5-1.5 MG/5ML syrup Take 2.5 mLs by mouth at bedtime as needed. Patient taking differently: Take 2.5 mLs by mouth at bedtime as needed for cough.  03/12/15  Yes Collene Gobble, MD  Magnesium Hydroxide (MILK OF MAGNESIA PO) Take 1 Dose by mouth daily as needed (constipation).   Yes Historical Provider, MD  mometasone-formoterol (DULERA) 200-5 MCG/ACT AERO Inhale 2 puffs into the lungs 2 (two) times daily. 03/15/15  Yes Brand Males, MD  Nutritional Supplements (ENSURE HIGH PROTEIN) LIQD Take 1 Bottle by mouth 3 (three) times daily. To be taken 1 bottle three times a day between meals; not as meal substitution.. 04/12/15  Yes Barton Dubois, MD  polyethylene glycol [facility] / GLYCOLAX) packet Take 17 g by mouth daily. Hold for diarrhea  Patient taking differently: Take 17 g by mouth daily as needed for mild constipation or moderate constipation. Hold for diarrhea 04/12/15  Yes Barton Dubois, MD  prochlorperazine (COMPAZINE) 10 MG tablet Take 1 tablet (10  mg total) by mouth every 6 (six) hours as needed for nausea or vomiting. 04/26/15  Yes Curt Bears, MD  RAPAFLO 8 MG CAPS capsule Take 1 capsule by mouth daily. 03/04/14  Yes Historical Provider, MD  Tiotropium Bromide Monohydrate (SPIRIVA RESPIMAT) 2.5 MCG/ACT AERS Inhale 2 puffs into the lungs daily. 04/09/15  Yes Brand Males, MD  sucralfate (CARAFATE) 1 G tablet Take 1 tab and dissolve in 6 oz water tid prn for swallowing. 05/26/15   Thea Silversmith, MD   No Known Allergies  FAMILY HISTORY:  indicated that his mother is deceased. He indicated that his father is deceased.  SOCIAL HISTORY:  reports that he quit smoking about 7 years ago. His smoking use included Cigarettes. He has a 67.5 pack-year smoking history. He quit smokeless tobacco use about 7 years ago. He reports that he does not drink alcohol or use illicit drugs.  REVIEW OF SYSTEMS:   Gen: Per history of present illness HEENT: Denies blurred vision, double vision, hearing loss, tinnitus, sinus congestion, rhinorrhea, sore throat, neck stiffness, dysphagia PULM: Per history of present illness CV: Denies chest pain, edema, orthopnea, paroxysmal nocturnal dyspnea, palpitations GI: Denies abdominal pain, nausea, vomiting, diarrhea, hematochezia, melena, constipation, change in bowel habits GU: Denies dysuria, hematuria, polyuria, oliguria, urethral discharge Endocrine: Denies hot or cold intolerance, polyuria, polyphagia or appetite change Derm: Denies rash, dry skin, scaling or peeling skin change Heme: Denies easy bruising, bleeding, bleeding gums Neuro: Denies headache, numbness, weakness, slurred speech, loss of memory or consciousness   SUBJECTIVE:   VITAL SIGNS: Temp:  [97.3 F (36.3 C)-97.5 F (36.4 C)] 97.3 F (36.3 C) (10/10 1717) Pulse Rate:  [106-112] 112 (10/10 1717) BP: (106-126)/(66-83) 106/83 mmHg (10/10 1717) SpO2:  [97 %-100 %] 100 % (10/10 1717) Weight:  [93 lb 1.6 oz (42.23 kg)-93 lb 6.4 oz (42.366  kg)] 93 lb 1.6 oz (42.23 kg) (10/10 1717) HEMODYNAMICS:   VENTILATOR SETTINGS:   INTAKE / OUTPUT: No intake or output data in the 24 hours ending 06/07/15 1725  PHYSICAL EXAMINATION: General:  Chronically ill Neuro:  Awake alert, oriented 4 HEENT:  NCAT, oropharynx clear Cardiovascular:  Tachycardic, regular rate and rhythm Lungs:  Diminished breath sounds bilaterally, faint wheeze Abdomen:  Bowel sounds positive, nontender nondistended Musculoskeletal:  Normal bulk and tone Skin:  No rash or skin breakdown  LABS:  CBC  Recent Labs Lab 06/01/15 1123  WBC 3.2*  HGB 10.9*  HCT 32.7*  PLT 231   Coag's No results for input(s): APTT, INR in the last 168 hours. BMET  Recent Labs Lab 06/01/15 1123  NA 139  K 3.7  CO2 27  BUN 23.0  CREATININE 0.9  GLUCOSE 112   Electrolytes  Recent Labs Lab 06/01/15 1123  CALCIUM 9.5   Sepsis Markers No results for input(s): LATICACIDVEN, PROCALCITON, O2SATVEN in the last 168 hours. ABG No results for input(s): PHART, PCO2ART, PO2ART in the last 168 hours. Liver Enzymes  Recent Labs Lab 06/01/15 1123  AST 17  ALT 13  ALKPHOS 63  BILITOT 1.64*  ALBUMIN 3.2*   Cardiac Enzymes No results for input(s): TROPONINI, PROBNP in the last 168 hours. Glucose No results for input(s): GLUCAP in the last 168 hours.  Imaging Dg Chest 2 View  06/07/2015   CLINICAL  DATA:  Cough, chest congestion, chest pain for the past 3 days, history of coronary artery disease, right upper lobe malignancy, COPD, and previous smoker.  EXAM: CHEST  2 VIEW  COMPARISON:  PA and lateral chest x-ray of May 18, 2015  FINDINGS: The lungs remain markedly hyperinflated. There has been interval development of an patchy interstitial infiltrate inferiorly in the right upper lobe. There are numerous vascular clips in the right upper lobe from previous partial lobectomy. The left lung exhibits no acute parenchymal abnormality. The heart and pulmonary  vascularity are normal for the degree of hyperinflation. There is no pneumothorax or pleural effusion. The bony thorax is unremarkable.  IMPRESSION: New interstitial infiltrate worrisome for pneumonia in the posterior inferior aspect of the right upper lobe. This may be postobstructive in nature given the known central hypermetabolic lymph node mass on PET-CT in August 2016.  Follow-up radiographs or chest CT scanning are recommended following anticipated antibiotic therapy.   Electronically Signed   By: David  Martinique M.D.   On: 06/07/2015 15:55     ASSESSMENT / PLAN:  PULMONARY  A: Healthcare associated pneumonia Acute exacerbation of COPD Very severe COPD Stage II a adenocarcinoma P:   Antibiotics as per ID below Solu-Medrol 60 twice a day Albuterol every 4 hours O2 as needed to maintain O2 saturation greater than 92%  CARDIOVASCULAR A: Mild tachycardia P:  Gentle IV fluids  RENAL A:  No acute issues P:   Monitor BMET and UOP Replace electrolytes as needed   GASTROINTESTINAL A:  No acute issues P:   Regular diet  HEMATOLOGIC A:  Stage II a adenocarcinoma of the lung P:  Radiation therapy on 06/07/2015 prior to admission  INFECTIOUS A:  Healthcare associated pneumonia P:   BCx2 October 10:  Vancomycin October 10: Zosyn October 10: Levaquin October 10:  Narrow antibiotics over the next 48 hours   ENDOCRINE A:  No acute issues P:   Monitor blood glucose  NEUROLOGIC A:  No acute issues P:      FAMILY  - Updates: Daughter updated in clinic visit on 06/07/2015   Roselie Awkward, MD [place] [facility] Pager: [phone] Cell: [phone] After 3pm or if no response, call [phone]   06/07/2015, 5:25 PM

## 2015-06-07 NOTE — Telephone Encounter (Signed)
Spoke with pt's daughter, c/o prod cough with yellow mucus, chest pain Xfew days.  Pt's daughter requesting asap appt.  Pt scheduled with BQ at 2:45.  Pt and daughter aware.  Nothing further needed.

## 2015-06-07 NOTE — Assessment & Plan Note (Signed)
Mr. Pacitti is having another exacerbation of his very severe COPD. He is in mild to moderate respiratory distress here in the office and considering his advanced age, severe COPD, malignancy, and his current chemotherapy treatment I do not feel comfortable discharging him home.  Plan: Chest x-ray to ensure there is nothing else going on like a pneumothorax Admit to the hospital Pulmonary service Solu-Medrol 60 mg IV every 12 hours DuoNeb scheduled every 4 hours Continue Dulera twice a day IV Levaquin 500 mg daily

## 2015-06-07 NOTE — Assessment & Plan Note (Signed)
He will go for radiation treatment today on his way to the hospital.

## 2015-06-07 NOTE — Patient Instructions (Signed)
We will have you admitted to the hospital

## 2015-06-07 NOTE — Progress Notes (Signed)
ANTIBIOTIC CONSULT NOTE - INITIAL  Pharmacy Consult for Vancomycin and Zosyn Indication: pneumonia  No Known Allergies  Patient Measurements: Height: '5\' 7"'$  (170.2 cm) Weight: 93 lb 1.6 oz (42.23 kg) IBW/kg (Calculated) : 66.1  Vital Signs: Temp: 97.3 F (36.3 C) (10/10 1717) Temp Source: Oral (10/10 1717) BP: 106/83 mmHg (10/10 1717) Pulse Rate: 112 (10/10 1717) Intake/Output from previous day:   Intake/Output from this shift:    Labs: No results for input(s): WBC, HGB, PLT, LABCREA, CREATININE in the last 72 hours. Estimated Creatinine Clearance: 36.5 mL/min (by C-G formula based on Cr of 0.9). No results for input(s): VANCOTROUGH, VANCOPEAK, VANCORANDOM, GENTTROUGH, GENTPEAK, GENTRANDOM, TOBRATROUGH, TOBRAPEAK, TOBRARND, AMIKACINPEAK, AMIKACINTROU, AMIKACIN in the last 72 hours.   Microbiology: No results found for this or any previous visit (from the past 720 hour(s)).  Medical History: Past Medical History  Diagnosis Date  . BPH (benign prostatic hypertrophy)     takes Rapaflo daily  . Chronic airway obstruction, not elsewhere classified   . Asthma     uses Spiriva and Dulera daily  . Emphysema lung ([place])   . Chronic bronchitis ([place])   . Shortness of breath     Albuterol inhaler as needed;lying and exertion  . Pneumonia     "a few times" (08/26/2014)  . Cough     productive but without fever  . Arthritis   . Joint pain   . Cataracts, bilateral     immature  . History of shingles   . Cancer ([place])   . Hypertension    Medications:  Scheduled:  . albuterol  2.5 mg Nebulization Q4H  . enoxaparin (LOVENOX) injection  40 mg Subcutaneous Q24H  . fluticasone  2 spray Each Nare Daily  . guaiFENesin  600 mg Oral BID  . ipratropium  0.5 mg Nebulization Q4H  . levofloxacin (LEVAQUIN) IV  750 mg Intravenous Q48H  . methylPREDNISolone (SOLU-MEDROL) injection  60 mg Intravenous Q12H  . mometasone-formoterol  2 puff Inhalation BID  . [START ON 06/08/2015]  pantoprazole  40 mg Oral Q1200  . polyethylene glycol  17 g Oral Daily  . sodium chloride  3 mL Intravenous Q12H  . tamsulosin  0.4 mg Oral QPC supper   Anti-infectives    Start     Dose/Rate Route Frequency Ordered Stop   06/07/15 1815  levofloxacin (LEVAQUIN) IVPB 750 mg     750 mg 100 mL/hr over 90 Minutes Intravenous Every 48 hours 06/07/15 1715       Assessment: 61 yoM with PNA, hx of Lung Ca and current chemotherapy & XRT.  Vancomycin and Zosyn per Pharmacy, Levaquin per MD, adjusted by pharmacy.  Goal of Therapy:  Vancomycin trough level 15-20 mcg/ml  Plan:   Vancomycin 1gm q24  Zosyn 3.375gm q8hr- 4 hr infusion  Levaquin ordered '500mg'$  q24, adjusted to '750mg'$  q48hr  Blood cx sent, sputum sample not representative of lower resp tract  Follow cultures, renal function, de-escalate antibiotics with response  Minda Ditto PharmD Pager [phone] 06/07/2015, 7:33 PM

## 2015-06-08 ENCOUNTER — Encounter (HOSPITAL_COMMUNITY): Payer: Self-pay | Admitting: *Deleted

## 2015-06-08 ENCOUNTER — Ambulatory Visit
Admission: RE | Admit: 2015-06-08 | Discharge: 2015-06-08 | Disposition: A | Discharge status: Home / Self Care | Payer: Medicare Other | Source: Ambulatory Visit | Attending: Radiation Oncology | Admitting: Radiation Oncology

## 2015-06-08 VITALS — BP 99/41 | HR 95 | Temp 97.6°F | Wt 99.1 lb

## 2015-06-08 DIAGNOSIS — C3491 Malignant neoplasm of unspecified part of right bronchus or lung: Secondary | ICD-10-CM

## 2015-06-08 LAB — CBC
HEMATOCRIT: 26.5 % — AB (ref 39.0–52.0)
Hemoglobin: 8.9 g/dL — ABNORMAL LOW (ref 13.0–17.0)
MCH: 27.6 pg (ref 26.0–34.0)
MCHC: 33.6 g/dL (ref 30.0–36.0)
MCV: 82.3 fL (ref 78.0–100.0)
PLATELETS: 176 10*3/uL (ref 150–400)
RBC: 3.22 MIL/uL — ABNORMAL LOW (ref 4.22–5.81)
RDW: 16.7 % — AB (ref 11.5–15.5)
WBC: 4.4 10*3/uL (ref 4.0–10.5)

## 2015-06-08 LAB — BASIC METABOLIC PANEL
Anion gap: 8 (ref 5–15)
BUN: 24 mg/dL — AB (ref 6–20)
CHLORIDE: 102 mmol/L (ref 101–111)
CO2: 27 mmol/L (ref 22–32)
CREATININE: 1.04 mg/dL (ref 0.61–1.24)
Calcium: 8.7 mg/dL — ABNORMAL LOW (ref 8.9–10.3)
GFR calc Af Amer: >60 mL/min (ref >60)
GLUCOSE: 137 mg/dL — AB (ref 65–99)
POTASSIUM: 4.7 mmol/L (ref 3.5–5.1)
Sodium: 137 mmol/L (ref 135–145)

## 2015-06-08 MED ORDER — KETOROLAC TROMETHAMINE 15 MG/ML IJ SOLN
15.0000 mg | Freq: Four times a day (QID) | INTRAMUSCULAR | Status: DC
  Administered 2015-06-08 – 2015-06-09 (×3): 15 mg via INTRAVENOUS
  Filled 2015-06-08 (×7): qty 1

## 2015-06-08 MED ORDER — ENSURE ENLIVE PO LIQD
237.0000 mL | Freq: Two times a day (BID) | ORAL | Status: DC
  Administered 2015-06-08 – 2015-06-09 (×2): 237 mL via ORAL

## 2015-06-08 MED ORDER — ZOLPIDEM TARTRATE 5 MG PO TABS: 5.0000 mg | ORAL_TABLET | Freq: Every evening | ORAL | Status: DC | PRN

## 2015-06-08 MED ORDER — FLUCONAZOLE 40 MG/ML PO SUSR
200.0000 mg | Freq: Every day | ORAL | Status: DC
  Administered 2015-06-08 – 2015-06-09 (×2): 200 mg via ORAL
  Filled 2015-06-08 (×2): qty 5

## 2015-06-08 MED ORDER — METHYLPREDNISOLONE SODIUM SUCC 40 MG IJ SOLR
40.0000 mg | Freq: Two times a day (BID) | INTRAMUSCULAR | Status: DC
  Administered 2015-06-08 – 2015-06-09 (×2): 40 mg via INTRAVENOUS
  Filled 2015-06-08 (×4): qty 1

## 2015-06-08 NOTE — Progress Notes (Signed)
PULMONARY / CRITICAL CARE MEDICINE   Name: Frank Garcia MRN: [id number] DOB: 12/11/1928    ADMISSION DATE:  06/07/2015 CONSULTATION DATE:  06/07/2015  REFERRING MD :  Lake Bells  CHIEF COMPLAINT:  Shortness of breath  INITIAL PRESENTATION: 81 year old male with adenocarcinoma and very severe COPD was admitted from the pulmonary office on 06/07/2015 with a COPD exacerbation and possibly pneumonia.  STUDIES:    SIGNIFICANT EVENTS:   SUBJECTIVE:  Not really feeling better. Didn't sleep. Still hurts to take breath on right.  VITAL SIGNS: Temp:  [97.3 F (36.3 C)-98.7 F (37.1 C)] 98.7 F (37.1 C) (10/11 0538) Pulse Rate:  [92-112] 96 (10/11 0538) BP: (105-142)/(58-83) 142/59 mmHg (10/11 0538) SpO2:  [97 %-100 %] 100 % (10/11 0735) Weight:  [42.23 kg (93 lb 1.6 oz)-42.366 kg (93 lb 6.4 oz)] 42.23 kg (93 lb 1.6 oz) (10/10 1717) HEMODYNAMICS:   VENTILATOR SETTINGS:   INTAKE / OUTPUT: No intake or output data in the 24 hours ending 06/08/15 0904  PHYSICAL EXAMINATION: General:  Chronically ill Neuro:  Awake alert, oriented 4 HEENT:  NCAT, oropharynx clear Cardiovascular:  Tachycardic, regular rate and rhythm Lungs:  Diminished breath sounds bilaterally, faint wheeze -->resolved Abdomen:  Bowel sounds positive, nontender nondistended Musculoskeletal:  Normal bulk and tone Skin:  No rash or skin breakdown  LABS:  CBC  Recent Labs Lab 06/01/15 1123 06/07/15 1755 06/08/15 0535  WBC 3.2* 7.2 4.4  HGB 10.9* 10.8* 8.9*  HCT 32.7* 32.8* 26.5*  PLT 231 245 176   Coag's No results for input(s): APTT, INR in the last 168 hours. BMET  Recent Labs Lab 06/01/15 1123 06/07/15 1755 06/08/15 0535  NA 139 139 137  K 3.7 5.2* 4.7  CL  --  98* 102  CO2 '27 28 27  '$ BUN 23.0 26* 24*  CREATININE 0.9 1.06 1.04  GLUCOSE 112 123* 137*   Electrolytes  Recent Labs Lab 06/01/15 1123 06/07/15 1755 06/08/15 0535  CALCIUM 9.5 9.4 8.7*   Sepsis Markers No results for  input(s): LATICACIDVEN, PROCALCITON, O2SATVEN in the last 168 hours. ABG No results for input(s): PHART, PCO2ART, PO2ART in the last 168 hours. Liver Enzymes  Recent Labs Lab 06/01/15 1123 06/07/15 1755  AST 17 30  ALT 13 14*  ALKPHOS 63 69  BILITOT 1.64* 2.5*  ALBUMIN 3.2* 3.5   Cardiac Enzymes No results for input(s): TROPONINI, PROBNP in the last 168 hours. Glucose No results for input(s): GLUCAP in the last 168 hours.  Imaging X-ray Chest Pa And Lateral  06/07/2015   CLINICAL DATA:  Chronic shortness of breath history of emphysema chronic bronchitis. History of hypertension. History of lung carcinoma.  EXAM: CHEST  2 VIEW  COMPARISON:  06/07/2015 at 1540 hours  FINDINGS: There EF patchy airspace and irregular interstitial opacity in the right mid lung, projecting along the oblique fissure in the right upper lobe, is without change the most recent prior study, new from older exams.  There are no new areas of lung opacity new when compared with the study obtained earlier the same date.  Lungs are hyperexpanded. There are advanced changes of emphysema. Stable scarring is noted in the lung bases mostly on the right. Surgical vascular clips project over the right upper to mid lung.  Cardiac silhouette is normal in size. No mediastinal or hilar masses or convincing adenopathy.  No pleural effusion or pneumothorax.  IMPRESSION: 1. No change from the earlier study. 2. Area of irregular consolidation in the posterior right upper lobe  adjacent to the oblique fissure consistent with pneumonia. This is superimposed on changes of advanced COPD.   Electronically Signed   By: Lajean Manes M.D.   On: 06/07/2015 18:34   Dg Chest 2 View  06/07/2015   CLINICAL DATA:  Cough, chest congestion, chest pain for the past 3 days, history of coronary artery disease, right upper lobe malignancy, COPD, and previous smoker.  EXAM: CHEST  2 VIEW  COMPARISON:  PA and lateral chest x-ray of May 18, 2015   FINDINGS: The lungs remain markedly hyperinflated. There has been interval development of an patchy interstitial infiltrate inferiorly in the right upper lobe. There are numerous vascular clips in the right upper lobe from previous partial lobectomy. The left lung exhibits no acute parenchymal abnormality. The heart and pulmonary vascularity are normal for the degree of hyperinflation. There is no pneumothorax or pleural effusion. The bony thorax is unremarkable.  IMPRESSION: New interstitial infiltrate worrisome for pneumonia in the posterior inferior aspect of the right upper lobe. This may be postobstructive in nature given the known central hypermetabolic lymph node mass on PET-CT in August 2016.  Follow-up radiographs or chest CT scanning are recommended following anticipated antibiotic therapy.   Electronically Signed   By: David  Martinique M.D.   On: 06/07/2015 15:55   RUL airspace disease.   ASSESSMENT / PLAN:   Healthcare associated pneumonia Acute exacerbation of COPD Very severe COPD Stage IIa adenocarcinoma P:   Solu-Medrol -->decreased to 40 q12 Albuterol every 4 hours O2 as needed to maintain O2 saturation greater than 92% Vancomycin October 10:-->dc Zosyn October 10: Levaquin October 10: Add toradol for pain  Planning for XRT today, is supposed to go for last dose chemo 10/12. Not clear to me that he should get systemic therapy while acutely infected. Will likely need to defer. Will let Dr Julien Nordmann know he is here.   Mild tachycardia P:  Gentle IV fluids  Stage II a adenocarcinoma of the lung P:  Radiation therapy on 06/07/2015 prior to admission  Severe protein calorie malnutrition P: Dietary consult   Possible esophagitis P:  Depending on course and response to PPI, H2 blockade, may need to consider an endoscopy to insure no candidal esophagitis, etc.     FAMILY  - Updates: Daughter updated in clinic visit on 06/07/2015 06/08/2015, 9:04 AM   Attending Note:   I have examined patient, reviewed labs, studies and notes. I have discussed the case with Jerrye Bushy, and I agree with the data and plans as amended above.  Treating HCAP and associated AE-COPD. Concerned that he should defer chemo while being treated for PNA. Also the risk for esophagitis given his XRT / chemo.   Baltazar Apo, MD, PhD 06/08/2015, 10:08 AM [facility] [phone] or if no answer [phone]

## 2015-06-08 NOTE — Care Management Note (Signed)
Case Management Note  Patient Details  Name: RETT STEHLIK MRN: [id number] Date of Birth: 1929/02/04  Subjective/Objective:         Copd versus pna           Action/Plan:Date:  Oct. 11, 2016 U.R. performed for needs and level of care. Will continue to follow for Case Management needs.  Velva Harman, RN, BSN, [place]   [phone]   Expected Discharge Date:                  Expected Discharge Plan:  Home/Self Care  In-House Referral:  NA  Discharge planning Services  CM Consult  Post Acute Care Choice:  NA Choice offered to:  NA  DME Arranged:    DME Agency:     HH Arranged:    HH Agency:     Status of Service:  In process, will continue to follow  Medicare Important Message Given:    Date Medicare IM Given:    Medicare IM give by:    Date Additional Medicare IM Given:    Additional Medicare Important Message give by:     If discussed at [place] of Stay Meetings, dates discussed:    Additional Comments:  Leeroy Cha, RN 06/08/2015, 2:36 PM

## 2015-06-08 NOTE — Progress Notes (Signed)
Weekly Management Note Current Dose: 52 Gy  Projected Dose:60 Gy   Narrative:  The patient presents for routine under treatment assessment.  CBCT/MVCT images/Port film x-rays were reviewed.  The chart was checked. Hospitalized for COPD and bronchitis. Feeling better since being in hospital.   Physical Findings: Pleasant man who appears his stated age. Nasal cannula in place with 3L of oxygen. No skin changes anteriorly or posteriorly.   Vitals:  Filed Vitals:   06/08/15 1633  BP: 99/41  Pulse: 95  Temp: 97.6 F (36.4 C)   Weight:  Wt Readings from Last 3 Encounters:  06/07/15 93 lb 1.6 oz (42.23 kg)  06/08/15 99 lb 1.6 oz (44.951 kg)  06/07/15 93 lb 6.4 oz (42.366 kg)   Lab Results  Component Value Date   WBC 4.4 06/08/2015   HGB 8.9* 06/08/2015   HCT 26.5* 06/08/2015   MCV 82.3 06/08/2015   PLT 176 06/08/2015   Lab Results  Component Value Date   CREATININE 1.04 06/08/2015   BUN 24* 06/08/2015   NA 137 06/08/2015   K 4.7 06/08/2015   CL 102 06/08/2015   CO2 27 06/08/2015     Impression:  The patient is tolerating radiation.  Plan:  Continue treatment as planned. Hold chemo? Finishes RT on Monday.

## 2015-06-08 NOTE — Progress Notes (Signed)
Initial Nutrition Assessment  DOCUMENTATION CODES:   Severe malnutrition in context of acute illness/injury, Underweight  INTERVENTION:  - Will order Ensure Enlive po BID, each supplement provides 350 kcal and 20 grams of protein - Encourage PO intakes with meals and supplements; encourage softer foods when throat pain is more severe - RD will continue to monitor for needs  NUTRITION DIAGNOSIS:   Increased nutrient needs related to catabolic illness, cancer and cancer related treatments as evidenced by estimated needs.  GOAL:   Patient will meet greater than or equal to 90% of their needs  MONITOR:   PO intake, Supplement acceptance, Weight trends, Labs, I & O's  REASON FOR ASSESSMENT:   Consult Assessment of nutrition requirement/status  ASSESSMENT:   81 year old male who has very severe COPD and stage II adenocarcinoma currently undergoing treatment with chemotherapy and radiation therapy who came to the pulmonary office on 06/07/2015 complaining of worsening shortness of breath again. He said that for 1 week he's been having increasing shortness of breath, chest pain on the right side with breathing, cough productive of clear mucus. He denies fevers or chills or sick contacts. He denies leg swelling or any extremity pain. He says that his chemotherapy and radiation therapy has been going okay. He was scheduled to have radiation therapy today. He recently was hospitalized for pneumonia and followed up in our clinic earlier this month where he had a chest x-ray that showed normalization of his previous pneumonia.  Pt seen for consult. BMI indicates underweight status. Per chart review, pt consumed 75% of breakfast this AM. He states that for breakfast he had scrambled eggs, a muffin, grits, and coffee. Pt denies nausea but states he had abdominal pain prior to eating which remains consistent, without exacerbation, following breakfast. He states that throat is very sore this AM and  swallowing exacerbates this; per MD notes: possible esophagitis.   Pt states that appetite was decreased the past 3-4 weeks PTA and that throat pain was intermittent but would usually last for a few days each time he experienced it. Pt states that PTA he was losing weight but he was unsure of UBW or the amount of weight he may have lost. Per chart review, pt lost 4 lbs (4% body weight) in the past 4 days which is significant for time frame. Severe muscle and fat wasting to upper and lower body.  At home, pt was drinking both Ensure and Boost but states that he prefers vanilla Ensure; Ensure Enlive to be ordered BID and will monitor for need to increase to TID. Likely not fully meeting needs at this time. Medications reviewed. Labs reviewed; BUN elevated, Ca: 8.7 mg/dL.   Diet Order:  Diet Heart Room service appropriate?: Yes; Fluid consistency:: Thin  Skin:  Reviewed, no issues  Last BM:  10/9  Height:   Ht Readings from Last 1 Encounters:  06/07/15 '5\' 7"'$  (1.702 m)    Weight:   Wt Readings from Last 1 Encounters:  06/07/15 93 lb 1.6 oz (42.23 kg)    Ideal Body Weight:  67.27 kg (kg)  BMI:  Body mass index is 14.58 kg/(m^2).  Estimated Nutritional Needs:   Kcal:  3734-2876  Protein:  63-75 grams  Fluid:  1.8-2.2 L/day  EDUCATION NEEDS:   No education needs identified at this time     Jarome Matin, RD, LDN Inpatient Clinical Dietitian Pager # [phone] After hours/weekend pager # [phone]

## 2015-06-08 NOTE — Progress Notes (Signed)
BP 99/41 mmHg  Pulse 95  Temp(Src) 97.6 F (36.4 C)  Wt 99 lb 1.6 oz (44.951 kg)  SpO2 100%  Weekly assessment of radiation to lung.hospitalized on 06/07/15 for pneumonia.Denies pain.Increased shortness of breath. Wt Readings from Last 3 Encounters:  06/07/15 93 lb 1.6 oz (42.23 kg)  06/08/15 99 lb 1.6 oz (44.951 kg)  06/07/15 93 lb 6.4 oz (42.366 kg)

## 2015-06-09 ENCOUNTER — Ambulatory Visit
Admission: RE | Admit: 2015-06-09 | Discharge: 2015-06-09 | Disposition: A | Discharge status: Home / Self Care | Payer: Medicare Other | Source: Ambulatory Visit | Attending: Radiation Oncology | Admitting: Radiation Oncology

## 2015-06-09 ENCOUNTER — Ambulatory Visit: Payer: Medicare Other

## 2015-06-09 ENCOUNTER — Other Ambulatory Visit: Payer: Self-pay | Admitting: Acute Care

## 2015-06-09 ENCOUNTER — Ambulatory Visit (HOSPITAL_BASED_OUTPATIENT_CLINIC_OR_DEPARTMENT_OTHER): Payer: Medicare Other | Admitting: Internal Medicine

## 2015-06-09 ENCOUNTER — Other Ambulatory Visit (HOSPITAL_BASED_OUTPATIENT_CLINIC_OR_DEPARTMENT_OTHER): Payer: Medicare Other

## 2015-06-09 ENCOUNTER — Telehealth: Payer: Self-pay | Admitting: Internal Medicine

## 2015-06-09 ENCOUNTER — Encounter: Payer: Self-pay | Admitting: Internal Medicine

## 2015-06-09 VITALS — BP 128/58 | HR 89 | Temp 97.6°F | Resp 18 | Ht 67.0 in | Wt 99.9 lb

## 2015-06-09 DIAGNOSIS — C3411 Malignant neoplasm of upper lobe, right bronchus or lung: Secondary | ICD-10-CM

## 2015-06-09 DIAGNOSIS — C3491 Malignant neoplasm of unspecified part of right bronchus or lung: Secondary | ICD-10-CM | POA: Diagnosis not present

## 2015-06-09 LAB — BASIC METABOLIC PANEL
ANION GAP: 11 (ref 5–15)
BUN: 32 mg/dL — ABNORMAL HIGH (ref 6–20)
CHLORIDE: 103 mmol/L (ref 101–111)
CO2: 24 mmol/L (ref 22–32)
Calcium: 8.6 mg/dL — ABNORMAL LOW (ref 8.9–10.3)
Creatinine, Ser: 1.19 mg/dL (ref 0.61–1.24)
GFR calc Af Amer: >60 mL/min (ref >60)
GFR, EST NON AFRICAN AMERICAN: 54 mL/min — AB (ref >60)
GLUCOSE: 137 mg/dL — AB (ref 65–99)
POTASSIUM: 3.8 mmol/L (ref 3.5–5.1)
Sodium: 138 mmol/L (ref 135–145)

## 2015-06-09 LAB — CBC WITH DIFFERENTIAL/PLATELET
BASO%: 0.2 % (ref 0.0–2.0)
Basophils Absolute: 0 10*3/uL (ref 0.0–0.1)
EOS ABS: 0 10*3/uL (ref 0.0–0.5)
EOS%: 0 % (ref 0.0–7.0)
HEMATOCRIT: 26.4 % — AB (ref 38.4–49.9)
HGB: 8.7 g/dL — ABNORMAL LOW (ref 13.0–17.1)
LYMPH#: 0 10*3/uL — AB (ref 0.9–3.3)
LYMPH%: 0.6 % — ABNORMAL LOW (ref 14.0–49.0)
MCH: 27.3 pg (ref 27.2–33.4)
MCHC: 32.8 g/dL (ref 32.0–36.0)
MCV: 83.2 fL (ref 79.3–98.0)
MONO#: 0.2 10*3/uL (ref 0.1–0.9)
MONO%: 5.5 % (ref 0.0–14.0)
NEUT%: 93.7 % — AB (ref 39.0–75.0)
NEUTROS ABS: 4.1 10*3/uL (ref 1.5–6.5)
PLATELETS: 181 10*3/uL (ref 140–400)
RBC: 3.18 10*6/uL — ABNORMAL LOW (ref 4.20–5.82)
RDW: 18.3 % — ABNORMAL HIGH (ref 11.0–14.6)
WBC: 4.4 10*3/uL (ref 4.0–10.3)

## 2015-06-09 LAB — COMPREHENSIVE METABOLIC PANEL (CC13)
ALK PHOS: 62 U/L (ref 40–150)
ALT: 13 U/L (ref 0–55)
ANION GAP: 13 meq/L — AB (ref 3–11)
AST: 22 U/L (ref 5–34)
Albumin: 2.8 g/dL — ABNORMAL LOW (ref 3.5–5.0)
BILIRUBIN TOTAL: 0.61 mg/dL (ref 0.20–1.20)
BUN: 31.8 mg/dL — ABNORMAL HIGH (ref 7.0–26.0)
CALCIUM: 9 mg/dL (ref 8.4–10.4)
CO2: 22 mEq/L (ref 22–29)
CREATININE: 1 mg/dL (ref 0.7–1.3)
Chloride: 107 mEq/L (ref 98–109)
EGFR: 76 mL/min/{1.73_m2} — ABNORMAL LOW (ref >90)
Glucose: 128 mg/dl (ref 70–140)
Potassium: 4.3 mEq/L (ref 3.5–5.1)
Sodium: 142 mEq/L (ref 136–145)
TOTAL PROTEIN: 6.6 g/dL (ref 6.4–8.3)

## 2015-06-09 MED ORDER — PANTOPRAZOLE SODIUM 40 MG PO TBEC: 40.0000 mg | DELAYED_RELEASE_TABLET | Freq: Every day | ORAL | Status: AC

## 2015-06-09 MED ORDER — FLUCONAZOLE 40 MG/ML PO SUSR: 200.0000 mg | Freq: Every day | ORAL | Status: AC

## 2015-06-09 MED ORDER — LEVOFLOXACIN 750 MG PO TABS: 750.0000 mg | ORAL_TABLET | Freq: Every day | ORAL | Status: DC

## 2015-06-09 MED ORDER — ZOLPIDEM TARTRATE 5 MG PO TABS: 5.0000 mg | ORAL_TABLET | Freq: Every evening | ORAL | Status: AC | PRN

## 2015-06-09 MED ORDER — PREDNISONE 10 MG PO TABS: ORAL_TABLET | ORAL | Status: DC

## 2015-06-09 NOTE — Progress Notes (Signed)
[facility] Telephone:([phone]) [phone]   Fax:(336) [place], [place] [address]  DIAGNOSIS: Unresectable a stage IIA (T1a, N1, M0) non-small cell lung cancer, adenocarcinoma diagnosed in August 2016.  PRIOR THERAPY: None  CURRENT THERAPY: Concurrent chemoradiation with weekly carboplatin for AUC of 2 and paclitaxel 45 MG/M2. Status post 6 cycles last cycle was given on 06/01/2015.  INTERVAL HISTORY: Frank Garcia 81 y.o. male returns to the clinic today for follow-up visit accompanied by his daughter. The patient was just discharged from [facility] earlier today after being admitted for questionable pneumonia and COPD exacerbation. He is feeling much better. He has been tolerating his treatment with concurrent chemoradiation fairly well. He is expected to receive the last fraction of radiotherapy early next week. He denied having any significant fever or chills, he has no nausea or vomiting. The patient continues to have shortness breath and mild cough but no hemoptysis. He has no significant weight loss or night sweats.  MEDICAL HISTORY: Past Medical History  Diagnosis Date  . BPH (benign prostatic hypertrophy)     takes Rapaflo daily  . Chronic airway obstruction, not elsewhere classified   . Asthma     uses Spiriva and Dulera daily  . Emphysema lung ([place])   . Chronic bronchitis ([place])   . Shortness of breath     Albuterol inhaler as needed;lying and exertion  . Pneumonia     "a few times" (08/26/2014)  . Cough     productive but without fever  . Arthritis   . Joint pain   . Cataracts, bilateral     immature  . History of shingles   . Cancer ([place])   . Hypertension     ALLERGIES:  has No Known Allergies.  MEDICATIONS:  Current Outpatient Prescriptions  Medication Sig Dispense Refill  . albuterol (PROVENTIL HFA;VENTOLIN HFA) 108 (90 BASE) MCG/ACT inhaler Inhale 2 puffs into the lungs  every 4 (four) hours as needed for wheezing or shortness of breath. 1 Inhaler 3  . albuterol (PROVENTIL) (2.5 MG/3ML) 0.083% nebulizer solution Take 3 mLs (2.5 mg total) by nebulization every 2 (two) hours as needed for wheezing or shortness of breath. 540 mL 4  . fluconazole (DIFLUCAN) 40 MG/ML suspension Take 5 mLs (200 mg total) by mouth daily. 45 mL 0  . fluticasone (FLONASE) 50 MCG/ACT nasal spray Place 2 sprays into both nostrils daily. (Patient taking differently: Place 2 sprays into both nostrils daily as needed for allergies. ) 16 g 5  . guaiFENesin (MUCINEX) 600 MG 12 hr tablet Take 1 tablet (600 mg total) by mouth 2 (two) times daily. (Patient taking differently: Take 600 mg by mouth 2 (two) times daily as needed for cough or to loosen phlegm. ) 30 tablet 0  . HYDROcodone-acetaminophen (HYCET) 7.5-325 mg/15 ml solution Take 10 mLs by mouth 4 (four) times daily as needed for moderate pain. 473 mL 0  . HYDROcodone-homatropine (HYDROMET) 5-1.5 MG/5ML syrup Take 2.5 mLs by mouth at bedtime as needed. (Patient taking differently: Take 2.5 mLs by mouth at bedtime as needed for cough. ) 240 mL 0  . levofloxacin (LEVAQUIN) 750 MG tablet Take 1 tablet (750 mg total) by mouth daily. 6 tablet 0  . Magnesium Hydroxide (MILK OF MAGNESIA PO) Take 1 Dose by mouth daily as needed (constipation).    . mometasone-formoterol (DULERA) 200-5 MCG/ACT AERO Inhale 2 puffs into the lungs 2 (  two) times daily. 2 Inhaler 0  . Nutritional Supplements (ENSURE HIGH PROTEIN) LIQD Take 1 Bottle by mouth 3 (three) times daily. To be taken 1 bottle three times a day between meals; not as meal substitution.. 17793 mL 3  . pantoprazole (PROTONIX) 40 MG tablet Take 1 tablet (40 mg total) by mouth daily at 12 noon. 60 tablet 3  . polyethylene glycol (MIRALAX / GLYCOLAX) packet Take 17 g by mouth daily. Hold for diarrhea (Patient taking differently: Take 17 g by mouth daily as needed for mild constipation or moderate constipation.  Hold for diarrhea) 28 each 0  . predniSONE (DELTASONE) 10 MG tablet Take 4 tabs  daily with food x 4 days, then 3 tabs daily x 4 days, then 2 tabs daily x 4 days, then 1 tab daily x4 days then stop. #40 40 tablet 0  . prochlorperazine (COMPAZINE) 10 MG tablet Take 1 tablet (10 mg total) by mouth every 6 (six) hours as needed for nausea or vomiting. 30 tablet 0  . RAPAFLO 8 MG CAPS capsule Take 1 capsule by mouth daily.    . sucralfate (CARAFATE) 1 G tablet Take 1 tab and dissolve in 6 oz water tid prn for swallowing. 60 tablet 1  . Tiotropium Bromide Monohydrate (SPIRIVA RESPIMAT) 2.5 MCG/ACT AERS Inhale 2 puffs into the lungs daily. 3 Inhaler 1  . zolpidem (AMBIEN) 5 MG tablet Take 1 tablet (5 mg total) by mouth at bedtime as needed for sleep. 30 tablet 0   No current facility-administered medications for this visit.   Facility-Administered Medications Ordered in Other Visits  Medication Dose Route Frequency Provider Last Rate Last Dose  . 0.9 %  sodium chloride infusion  250 mL Intravenous PRN Tammy S Parrett, NP      . acetaminophen (TYLENOL) tablet 650 mg  650 mg Oral Q6H PRN Tammy S Parrett, NP       Or  . acetaminophen (TYLENOL) suppository 650 mg  650 mg Rectal Q6H PRN Tammy S Parrett, NP      . albuterol (PROVENTIL) (2.5 MG/3ML) 0.083% nebulizer solution 2.5 mg  2.5 mg Nebulization Q2H PRN Tammy S Parrett, NP      . enoxaparin (LOVENOX) injection 30 mg  30 mg Subcutaneous Q24H Juanito Doom, MD   30 mg at 06/08/15 1833  . feeding supplement (ENSURE ENLIVE) (ENSURE ENLIVE) liquid 237 mL  237 mL Oral BID BM Maricela Bo [place], RD   237 mL at 06/09/15 1007  . fluconazole (DIFLUCAN) 40 MG/ML suspension 200 mg  200 mg Oral Daily Erick Colace, NP   200 mg at 06/09/15 0957  . fluticasone (FLONASE) 50 MCG/ACT nasal spray 2 spray  2 spray Each Nare Daily Melvenia Needles, NP   2 spray at 06/09/15 0958  . guaiFENesin (MUCINEX) 12 hr tablet 600 mg  600 mg Oral BID Melvenia Needles, NP   600 mg  at 06/09/15 0957  . HYDROcodone-acetaminophen (HYCET) 7.5-325 mg/15 ml solution 10 mL  10 mL Oral QID PRN Tammy S Parrett, NP      . ipratropium-albuterol (DUONEB) 0.5-2.5 (3) MG/3ML nebulizer solution 3 mL  3 mL Nebulization Q4H Juanito Doom, MD   3 mL at 06/09/15 0802  . ketorolac (TORADOL) 15 MG/ML injection 15 mg  15 mg Intravenous 4 times per day Erick Colace, NP   15 mg at 06/09/15 0500  . levofloxacin (LEVAQUIN) IVPB 750 mg  750 mg Intravenous Q48H Tammy Jeralene Huff, NP  750 mg at 06/07/15 1851  . methylPREDNISolone sodium succinate (SOLU-MEDROL) 40 mg/mL injection 40 mg  40 mg Intravenous Q12H Erick Colace, NP   40 mg at 06/09/15 0504  . mometasone-formoterol (DULERA) 200-5 MCG/ACT inhaler 2 puff  2 puff Inhalation BID Melvenia Needles, NP   2 puff at 06/09/15 0802  . pantoprazole (PROTONIX) EC tablet 40 mg  40 mg Oral Q1200 Tammy S Parrett, NP   40 mg at 06/08/15 1002  . piperacillin-tazobactam (ZOSYN) IVPB 3.375 g  3.375 g Intravenous Q8H Terri L Green, RPH   3.375 g at 06/09/15 0430  . polyethylene glycol (MIRALAX / GLYCOLAX) packet 17 g  17 g Oral Daily Tammy S Parrett, NP   17 g at 06/09/15 0957  . prochlorperazine (COMPAZINE) tablet 10 mg  10 mg Oral Q6H PRN Tammy S Parrett, NP      . sodium chloride 0.9 % injection 3 mL  3 mL Intravenous Q12H Tammy S Parrett, NP   3 mL at 06/09/15 1001  . sodium chloride 0.9 % injection 3 mL  3 mL Intravenous PRN Tammy S Parrett, NP      . tamsulosin (FLOMAX) capsule 0.4 mg  0.4 mg Oral QPC supper Tammy S Parrett, NP   0.4 mg at 06/08/15 1832  . zolpidem (AMBIEN) tablet 5 mg  5 mg Oral QHS PRN Erick Colace, NP        SURGICAL HISTORY:  Past Surgical History  Procedure Laterality Date  . No past surgeries    . Video bronchoscopy with endobronchial navigation N/A 03/10/2015    Procedure: VIDEO BRONCHOSCOPY WITH ENDOBRONCHIAL NAVIGATION and PLACEMENT OF 4MM X 7MM FIDUCIAL MARKERS x3;  Surgeon: Collene Gobble, MD;  Location: [facility];   Service: Thoracic;  Laterality: N/A;    REVIEW OF SYSTEMS:  A comprehensive review of systems was negative except for: Constitutional: positive for anorexia and fatigue Respiratory: positive for cough, dyspnea on exertion and sputum   PHYSICAL EXAMINATION: General appearance: alert, cooperative, fatigued and no distress Head: Normocephalic, without obvious abnormality, atraumatic Neck: no adenopathy, no JVD, supple, symmetrical, trachea midline and thyroid not enlarged, symmetric, no tenderness/mass/nodules Lymph nodes: Cervical, supraclavicular, and axillary nodes normal. Resp: clear to auscultation bilaterally Back: symmetric, no curvature. ROM normal. No CVA tenderness. Cardio: regular rate and rhythm, S1, S2 normal, no murmur, click, rub or gallop GI: soft, non-tender; bowel sounds normal; no masses,  no organomegaly Extremities: extremities normal, atraumatic, no cyanosis or edema  ECOG PERFORMANCE STATUS: 1 - Symptomatic but completely ambulatory  Blood pressure 128/58, pulse 89, temperature 97.6 F (36.4 C), temperature source Oral, resp. rate 18, height '5\' 7"'$  (1.702 m), weight 99 lb 14.4 oz (45.314 kg), SpO2 95 %.  LABORATORY DATA: Lab Results  Component Value Date   WBC 4.4 06/09/2015   HGB 8.7* 06/09/2015   HCT 26.4* 06/09/2015   MCV 83.2 06/09/2015   PLT 181 06/09/2015      Chemistry      Component Value Date/Time   NA 142 06/09/2015 1100   NA 138 06/09/2015 0530   K 4.3 06/09/2015 1100   K 3.8 06/09/2015 0530   CL 103 06/09/2015 0530   CO2 22 06/09/2015 1100   CO2 24 06/09/2015 0530   BUN 31.8* 06/09/2015 1100   BUN 32* 06/09/2015 0530   CREATININE 1.0 06/09/2015 1100   CREATININE 1.19 06/09/2015 0530      Component Value Date/Time   CALCIUM 9.0 06/09/2015 1100   CALCIUM  8.6* 06/09/2015 0530   ALKPHOS 62 06/09/2015 1100   ALKPHOS 69 06/07/2015 1755   AST 22 06/09/2015 1100   AST 30 06/07/2015 1755   ALT 13 06/09/2015 1100   ALT 14* 06/07/2015 1755    BILITOT 0.61 06/09/2015 1100   BILITOT 2.5* 06/07/2015 1755       RADIOGRAPHIC STUDIES: X-ray Chest Pa And Lateral  06/07/2015  CLINICAL DATA:  Chronic shortness of breath history of emphysema chronic bronchitis. History of hypertension. History of lung carcinoma. EXAM: CHEST  2 VIEW COMPARISON:  06/07/2015 at 1540 hours FINDINGS: There EF patchy airspace and irregular interstitial opacity in the right mid lung, projecting along the oblique fissure in the right upper lobe, is without change the most recent prior study, new from older exams. There are no new areas of lung opacity new when compared with the study obtained earlier the same date. Lungs are hyperexpanded. There are advanced changes of emphysema. Stable scarring is noted in the lung bases mostly on the right. Surgical vascular clips project over the right upper to mid lung. Cardiac silhouette is normal in size. No mediastinal or hilar masses or convincing adenopathy. No pleural effusion or pneumothorax. IMPRESSION: 1. No change from the earlier study. 2. Area of irregular consolidation in the posterior right upper lobe adjacent to the oblique fissure consistent with pneumonia. This is superimposed on changes of advanced COPD. Electronically Signed   By: Lajean Manes M.D.   On: 06/07/2015 18:34   Dg Chest 2 View  06/07/2015  CLINICAL DATA:  Cough, chest congestion, chest pain for the past 3 days, history of coronary artery disease, right upper lobe malignancy, COPD, and previous smoker. EXAM: CHEST  2 VIEW COMPARISON:  PA and lateral chest x-ray of May 18, 2015 FINDINGS: The lungs remain markedly hyperinflated. There has been interval development of an patchy interstitial infiltrate inferiorly in the right upper lobe. There are numerous vascular clips in the right upper lobe from previous partial lobectomy. The left lung exhibits no acute parenchymal abnormality. The heart and pulmonary vascularity are normal for the degree of  hyperinflation. There is no pneumothorax or pleural effusion. The bony thorax is unremarkable. IMPRESSION: New interstitial infiltrate worrisome for pneumonia in the posterior inferior aspect of the right upper lobe. This may be postobstructive in nature given the known central hypermetabolic lymph node mass on PET-CT in August 2016. Follow-up radiographs or chest CT scanning are recommended following anticipated antibiotic therapy. Electronically Signed   By: David  Martinique M.D.   On: 06/07/2015 15:55   Dg Chest 2 View  05/18/2015  CLINICAL DATA:  Followup pneumonia.  Ex-smoker.  History of COPD. EXAM: CHEST  2 VIEW COMPARISON:  04/11/2015 and PET-CT dated 04/16/2015. FINDINGS: Normal sized heart. Clear lungs. The lungs remain hyperexpanded. Right chest surgical clips are again demonstrated. Unremarkable bones. IMPRESSION: No acute abnormality.  Stable changes of COPD. Electronically Signed   By: Claudie Revering M.D.   On: 05/18/2015 10:20    ASSESSMENT AND PLAN: This is a very pleasant 81 years old African-American male with unresectable a stage II a non-small cell lung cancer, adenocarcinoma currently undergoing a course of concurrent chemoradiation with weekly carboplatin and paclitaxel status post 6 cycles of treatment. The patient was just discharged from [facility] today after treatment for pneumonia and COPD exacerbation. I recommended for him to discontinue his concurrent chemotherapy at this point. I will let the patient finished the last week of his radiation without chemotherapy. I will arrange  for the patient to come back for follow-up visit in 6 weeks with repeat CT scan of the chest for restaging of his disease. He was advised to call immediately if he has any concerning symptoms in the interval. The patient voices understanding of current disease status and treatment options and is in agreement with the current care plan.  All questions were answered. The patient knows to call the  clinic with any problems, questions or concerns. We can certainly see the patient much sooner if necessary.  Disclaimer: This note was dictated with voice recognition software. Similar sounding words can inadvertently be transcribed and may not be corrected upon review.

## 2015-06-09 NOTE — Discharge Summary (Signed)
Physician Discharge Summary       Patient ID: Frank Garcia MRN: [id number] DOB/AGE: 10/04/1928 81 y.o.  Admit date: 06/07/2015 Discharge date: 06/09/2015  Discharge Diagnoses:  Healthcare associated pneumonia Acute exacerbation of COPD Very severe COPD Stage IIa adenocarcinoma P:   Solu-Medrol -->change to pred and begin taper  Home on Levaquin for 6 more days.  Resume home BDs Home on 3 liters [place] F/u our office next week  Stage II a adenocarcinoma of the lung >heme/onc aware of admit P:  Holding chemo Cont XRT  Severe protein calorie malnutrition P: Dietary consult  Ensure Enlive po BID, each supplement provides 350 kcal and 20 grams of protein  Possible esophagitis P:  Oral diflucan X 9 more days  Detailed Hospital Course:  This is a pleasant 81 year old male who has very severe COPD and stage II adenocarcinoma currently undergoing treatment with chemotherapy and radiation therapy who came to the pulmonary office on 06/07/2015 complaining of worsening shortness of breath again. He said that for 1 week he's been having increasing shortness of breath, chest pain on the right side with breathing, cough productive of clear mucus. He denies fevers or chills or sick contacts. He denies leg swelling or any extremity pain. He says that his chemotherapy and radiation therapy has been going okay. He was scheduled to have radiation therapy today. He recently was hospitalized for pneumonia and followed up in our clinic earlier this month where he had a chest x-ray that showed normalization of his previous pneumonia. He was admitted w/ working dx of PNA & AECOPD. Treated empirically w/ ABX, and supportively w/ O2, nebs and systemic steroids. On 10/11 his primary complaint was painful swallowing we felt that this was in-part due to probable esophagitis. We treated this w/ PPIs as well as empiric diflucan. As of 10/12 he felt remarkably better and ready to go home. He was discharged to  home w/ the following plan of care as outlined below per active issues.     Discharge Plan by active problems     Healthcare associated pneumonia Acute exacerbation of COPD Very severe COPD Stage IIa adenocarcinoma Stage II a adenocarcinoma of the lung Severe protein calorie malnutrition Possible esophagitis Sinus tachycardia    Significant Hospital tests/ studies  Consults: radiation/onc    Discharge Exam: BP 102/45 mmHg  Pulse 89  Temp(Src) 97.7 F (36.5 C) (Oral)  Resp 18  Ht '5\' 7"'$  (1.702 m)  Wt 42.23 kg (93 lb 1.6 oz)  BMI 14.58 kg/m2  SpO2 100%  General: Chronically ill Neuro: Awake alert, oriented 4 HEENT: NCAT, oropharynx clear Cardiovascular: Tachycardic, regular rate and rhythm Lungs: Diminished breath sounds bilaterally, faint wheeze -->resolved Abdomen: Bowel sounds positive, nontender nondistended Musculoskeletal: Normal bulk and tone Skin: No rash or skin breakdown  Labs at discharge Lab Results  Component Value Date   CREATININE 1.19 06/09/2015   BUN 32* 06/09/2015   NA 138 06/09/2015   K 3.8 06/09/2015   CL 103 06/09/2015   CO2 24 06/09/2015   Lab Results  Component Value Date   WBC 4.4 06/08/2015   HGB 8.9* 06/08/2015   HCT 26.5* 06/08/2015   MCV 82.3 06/08/2015   PLT 176 06/08/2015   Lab Results  Component Value Date   ALT 14* 06/07/2015   AST 30 06/07/2015   ALKPHOS 69 06/07/2015   BILITOT 2.5* 06/07/2015   Lab Results  Component Value Date   INR 1.31 04/11/2015   INR 1.21 03/10/2015  Current radiology studies X-ray Chest Pa And Lateral  06/07/2015  CLINICAL DATA:  Chronic shortness of breath history of emphysema chronic bronchitis. History of hypertension. History of lung carcinoma. EXAM: CHEST  2 VIEW COMPARISON:  06/07/2015 at 1540 hours FINDINGS: There EF patchy airspace and irregular interstitial opacity in the right mid lung, projecting along the oblique fissure in the right upper lobe, is without change the  most recent prior study, new from older exams. There are no new areas of lung opacity new when compared with the study obtained earlier the same date. Lungs are hyperexpanded. There are advanced changes of emphysema. Stable scarring is noted in the lung bases mostly on the right. Surgical vascular clips project over the right upper to mid lung. Cardiac silhouette is normal in size. No mediastinal or hilar masses or convincing adenopathy. No pleural effusion or pneumothorax. IMPRESSION: 1. No change from the earlier study. 2. Area of irregular consolidation in the posterior right upper lobe adjacent to the oblique fissure consistent with pneumonia. This is superimposed on changes of advanced COPD. Electronically Signed   By: Lajean Manes M.D.   On: 06/07/2015 18:34   Dg Chest 2 View  06/07/2015  CLINICAL DATA:  Cough, chest congestion, chest pain for the past 3 days, history of coronary artery disease, right upper lobe malignancy, COPD, and previous smoker. EXAM: CHEST  2 VIEW COMPARISON:  PA and lateral chest x-ray of May 18, 2015 FINDINGS: The lungs remain markedly hyperinflated. There has been interval development of an patchy interstitial infiltrate inferiorly in the right upper lobe. There are numerous vascular clips in the right upper lobe from previous partial lobectomy. The left lung exhibits no acute parenchymal abnormality. The heart and pulmonary vascularity are normal for the degree of hyperinflation. There is no pneumothorax or pleural effusion. The bony thorax is unremarkable. IMPRESSION: New interstitial infiltrate worrisome for pneumonia in the posterior inferior aspect of the right upper lobe. This may be postobstructive in nature given the known central hypermetabolic lymph node mass on PET-CT in August 2016. Follow-up radiographs or chest CT scanning are recommended following anticipated antibiotic therapy. Electronically Signed   By: David  Martinique M.D.   On: 06/07/2015 15:55     Disposition:  01-Home or Self Care      Discharge Instructions    For home use only DME oxygen    Complete by:  As directed   Continue 3 liters  Mode or (Route):  Nasal cannula  Liters per Minute:  3  Frequency:  Continuous (stationary and portable oxygen unit needed)  Oxygen delivery system:  Gas            Medication List    TAKE these medications        albuterol 108 (90 BASE) MCG/ACT inhaler  Commonly known as:  PROVENTIL HFA;VENTOLIN HFA  Inhale 2 puffs into the lungs every 4 (four) hours as needed for wheezing or shortness of breath.     albuterol (2.5 MG/3ML) 0.083% nebulizer solution  Commonly known as:  PROVENTIL  Take 3 mLs (2.5 mg total) by nebulization every 2 (two) hours as needed for wheezing or shortness of breath.     ENSURE HIGH PROTEIN Liqd  Take 1 Bottle by mouth 3 (three) times daily. To be taken 1 bottle three times a day between meals; not as meal substitution..     fluconazole 40 MG/ML suspension  Commonly known as:  DIFLUCAN  Take 5 mLs (200 mg total) by mouth daily.  fluticasone 50 MCG/ACT nasal spray  Commonly known as:  FLONASE  Place 2 sprays into both nostrils daily.     guaiFENesin 600 MG 12 hr tablet  Commonly known as:  MUCINEX  Take 1 tablet (600 mg total) by mouth 2 (two) times daily.     HYDROcodone-acetaminophen 7.5-325 mg/15 ml solution  Commonly known as:  HYCET  Take 10 mLs by mouth 4 (four) times daily as needed for moderate pain.     HYDROcodone-homatropine 5-1.5 MG/5ML syrup  Commonly known as:  HYDROMET  Take 2.5 mLs by mouth at bedtime as needed.     levofloxacin 750 MG tablet  Commonly known as:  LEVAQUIN  Take 1 tablet (750 mg total) by mouth daily.     MILK OF MAGNESIA PO  Take 1 Dose by mouth daily as needed (constipation).     mometasone-formoterol 200-5 MCG/ACT Aero  Commonly known as:  DULERA  Inhale 2 puffs into the lungs 2 (two) times daily.     pantoprazole 40 MG tablet  Commonly known as:   PROTONIX  Take 1 tablet (40 mg total) by mouth daily at 12 noon.     polyethylene glycol packet  Commonly known as:  MIRALAX / GLYCOLAX  Take 17 g by mouth daily. Hold for diarrhea     predniSONE 10 MG tablet  Commonly known as:  DELTASONE  Take 4 tabs  daily with food x 4 days, then 3 tabs daily x 4 days, then 2 tabs daily x 4 days, then 1 tab daily x4 days then stop. #40     prochlorperazine 10 MG tablet  Commonly known as:  COMPAZINE  Take 1 tablet (10 mg total) by mouth every 6 (six) hours as needed for nausea or vomiting.     RAPAFLO 8 MG Caps capsule  Generic drug:  silodosin  Take 1 capsule by mouth daily.     sucralfate 1 G tablet  Commonly known as:  CARAFATE  Take 1 tab and dissolve in 6 oz water tid prn for swallowing.     Tiotropium Bromide Monohydrate 2.5 MCG/ACT Aers  Commonly known as:  SPIRIVA RESPIMAT  Inhale 2 puffs into the lungs daily.     zolpidem 5 MG tablet  Commonly known as:  AMBIEN  Take 1 tablet (5 mg total) by mouth at bedtime as needed for sleep.       Follow-up Information    Follow up with PARRETT,TAMMY, NP On 06/15/2015.   Specialty:  Pulmonary Disease   Why:  315   Contact information:   [address] [address] [phone]       Discharged Condition: good  Physician Statement:   The Patient was personally examined, the discharge assessment and plan has been personally reviewed and I agree with ACNP Babcock's assessment and plan. > 30 minutes of time have been dedicated to discharge assessment, planning and discharge instructions.   SignedMarni Griffon 06/09/2015, 10:18 AM   Baltazar Apo, MD, PhD 06/09/2015, 11:17 AM [facility] [phone] or if no answer [phone]

## 2015-06-09 NOTE — Progress Notes (Signed)
PULMONARY / CRITICAL CARE MEDICINE   Name: Frank Garcia MRN: [id number] DOB: Apr 17, 1929    ADMISSION DATE:  06/07/2015 CONSULTATION DATE:  06/07/2015  REFERRING MD :  [place]ells  CHIEF COMPLAINT:  Shortness of breath  INITIAL PRESENTATION: 81 year old male with adenocarcinoma and very severe COPD was admitted from the pulmonary office on 06/07/2015 with a COPD exacerbation and possibly pneumonia.  STUDIES:    SIGNIFICANT EVENTS:   SUBJECTIVE:  Not really feeling better. Didn't sleep. Still hurts to take breath on right.  VITAL SIGNS: Temp:  [97.6 F (36.4 C)-98.1 F (36.7 C)] 97.7 F (36.5 C) (10/12 0549) Pulse Rate:  [89-97] 89 (10/12 0549) Resp:  [18] 18 (10/12 0549) BP: (96-102)/(40-45) 102/45 mmHg (10/12 0549) SpO2:  [98 %-100 %] 100 % (10/12 0802) Weight:  [44.951 kg (99 lb 1.6 oz)] 44.951 kg (99 lb 1.6 oz) (10/11 1633) HEMODYNAMICS:   VENTILATOR SETTINGS:   INTAKE / OUTPUT:  Intake/Output Summary (Last 24 hours) at 06/09/15 0931 Last data filed at 06/09/15 0552  Gross per 24 hour  Intake      0 ml  Output    350 ml  Net   -350 ml    PHYSICAL EXAMINATION: General:  Chronically ill Neuro:  Awake alert, oriented 4 HEENT:  NCAT, oropharynx clear Cardiovascular:  Tachycardic, regular rate and rhythm Lungs:  Diminished breath sounds bilaterally, faint wheeze -->resolved Abdomen:  Bowel sounds positive, nontender nondistended Musculoskeletal:  Normal bulk and tone Skin:  No rash or skin breakdown  LABS:  CBC  Recent Labs Lab 06/07/15 1755 06/08/15 0535  WBC 7.2 4.4  HGB 10.8* 8.9*  HCT 32.8* 26.5*  PLT 245 176   Coag's No results for input(s): APTT, INR in the last 168 hours. BMET  Recent Labs Lab 06/07/15 1755 06/08/15 0535 06/09/15 0530  NA 139 137 138  K 5.2* 4.7 3.8  CL 98* 102 103  CO2 '28 27 24  '$ BUN 26* 24* 32*  CREATININE 1.06 1.04 1.19  GLUCOSE 123* 137* 137*   Electrolytes  Recent Labs Lab 06/07/15 1755 06/08/15 0535  06/09/15 0530  CALCIUM 9.4 8.7* 8.6*   Sepsis Markers No results for input(s): LATICACIDVEN, PROCALCITON, O2SATVEN in the last 168 hours. ABG No results for input(s): PHART, PCO2ART, PO2ART in the last 168 hours. Liver Enzymes  Recent Labs Lab 06/07/15 1755  AST 30  ALT 14*  ALKPHOS 69  BILITOT 2.5*  ALBUMIN 3.5   Cardiac Enzymes No results for input(s): TROPONINI, PROBNP in the last 168 hours. Glucose No results for input(s): GLUCAP in the last 168 hours.  Imaging No results found. RUL airspace disease.   ASSESSMENT / PLAN:   Healthcare associated pneumonia Acute exacerbation of COPD Very severe COPD Stage IIa adenocarcinoma P:   Solu-Medrol -->change to pred and begin taper  Albuterol every 4 hours O2 as needed to maintain O2 saturation greater than 92% Vancomycin October 10:-->dc 10/11 Zosyn October 10>>10/12 Levaquin October 10:>>> cont toradol for pain   Stage II a adenocarcinoma of the lung >heme/onc aware of admit P:  Radiation therapy on 06/07/2015 prior to admission Holding chemo Cont XRT  Severe protein calorie malnutrition P: Dietary consult  Ensure Enlive po BID, each supplement provides 350 kcal and 20 grams of protein  Possible esophagitis P:  Empiric rx w/ diflucan depending on course and response to PPI, H2 blockade, may need to consider an endoscopy to insure no candidal esophagitis, etc.     FAMILY  - Updates: Daughter updated  in clinic visit on 06/07/2015 06/09/2015, 9:31 AM   Attending Note:

## 2015-06-09 NOTE — Consult Note (Signed)
   [facility] CM Inpatient Consult   06/09/2015  [name] [birth year] [phone]   Came to bedside to speak with patient about [facility] services. However, he had been discharged.   Frank Rolling, MSN-Ed, RN,BSN [facility] Liaison [phone]

## 2015-06-09 NOTE — Care Management Important Message (Signed)
Important Message  Patient Details  Name: Frank Garcia MRN: [id number] Date of Birth: 06/08/1929   Medicare Important Message Given:  Yes-second notification given    [name] 06/09/2015, 11:35 AMImportant Message  Patient Details  Name: Frank Garcia MRN: [id number] Date of Birth: Apr 20, 1929   Medicare Important Message Given:  Yes-second notification given    [name] 06/09/2015, 11:35 AM

## 2015-06-09 NOTE — Telephone Encounter (Signed)
Gave and printed appt sched and avs fo rpt for OCT and NOV

## 2015-06-10 ENCOUNTER — Ambulatory Visit
Admission: RE | Admit: 2015-06-10 | Discharge: 2015-06-10 | Disposition: A | Discharge status: Home / Self Care | Payer: Medicare Other | Source: Ambulatory Visit | Attending: Radiation Oncology | Admitting: Radiation Oncology

## 2015-06-10 DIAGNOSIS — Z510 Encounter for antineoplastic radiation therapy: Secondary | ICD-10-CM | POA: Diagnosis present

## 2015-06-10 DIAGNOSIS — C3411 Malignant neoplasm of upper lobe, right bronchus or lung: Secondary | ICD-10-CM | POA: Diagnosis present

## 2015-06-10 DIAGNOSIS — F17210 Nicotine dependence, cigarettes, uncomplicated: Secondary | ICD-10-CM | POA: Diagnosis not present

## 2015-06-11 ENCOUNTER — Ambulatory Visit
Admission: RE | Admit: 2015-06-11 | Discharge: 2015-06-11 | Disposition: A | Discharge status: Home / Self Care | Payer: Medicare Other | Source: Ambulatory Visit | Attending: Radiation Oncology | Admitting: Radiation Oncology

## 2015-06-11 ENCOUNTER — Ambulatory Visit: Payer: Medicare Other

## 2015-06-11 DIAGNOSIS — Z510 Encounter for antineoplastic radiation therapy: Secondary | ICD-10-CM | POA: Diagnosis not present

## 2015-06-12 LAB — CULTURE, BLOOD (ROUTINE X 2)
CULTURE: NO GROWTH
Culture: NO GROWTH

## 2015-06-14 ENCOUNTER — Ambulatory Visit: Payer: Medicare Other

## 2015-06-14 ENCOUNTER — Ambulatory Visit
Admission: RE | Admit: 2015-06-14 | Discharge: 2015-06-14 | Disposition: A | Discharge status: Home / Self Care | Payer: Medicare Other | Source: Ambulatory Visit | Attending: Radiation Oncology | Admitting: Radiation Oncology

## 2015-06-14 DIAGNOSIS — Z510 Encounter for antineoplastic radiation therapy: Secondary | ICD-10-CM | POA: Diagnosis not present

## 2015-06-15 ENCOUNTER — Ambulatory Visit: Payer: Medicare Other

## 2015-06-15 ENCOUNTER — Ambulatory Visit (INDEPENDENT_AMBULATORY_CARE_PROVIDER_SITE_OTHER)
Admission: RE | Admit: 2015-06-15 | Discharge: 2015-06-15 | Disposition: A | Discharge status: Home / Self Care | Payer: Medicare Other | Source: Ambulatory Visit | Attending: Adult Health | Admitting: Adult Health

## 2015-06-15 ENCOUNTER — Ambulatory Visit: Payer: Medicare Other | Admitting: Radiation Oncology

## 2015-06-15 ENCOUNTER — Ambulatory Visit (INDEPENDENT_AMBULATORY_CARE_PROVIDER_SITE_OTHER): Payer: Medicare Other | Admitting: Adult Health

## 2015-06-15 ENCOUNTER — Encounter: Payer: Self-pay | Admitting: Adult Health

## 2015-06-15 ENCOUNTER — Ambulatory Visit: Admission: RE | Admit: 2015-06-15 | Payer: Medicare Other | Source: Ambulatory Visit

## 2015-06-15 VITALS — BP 112/76 | HR 101 | Temp 98.3°F | Ht 67.0 in | Wt 95.0 lb

## 2015-06-15 DIAGNOSIS — J189 Pneumonia, unspecified organism: Secondary | ICD-10-CM

## 2015-06-15 DIAGNOSIS — J441 Chronic obstructive pulmonary disease with (acute) exacerbation: Secondary | ICD-10-CM | POA: Diagnosis not present

## 2015-06-15 DIAGNOSIS — J9611 Chronic respiratory failure with hypoxia: Secondary | ICD-10-CM

## 2015-06-15 NOTE — Patient Instructions (Signed)
Finish Levaquin  Follow up in 2-3 weeks with chest xray .  follow up Dr. Chase Caller in Dec as planned and  As needed   Please contact office for sooner follow up if symptoms do not improve or worsen or seek emergency care

## 2015-06-15 NOTE — Assessment & Plan Note (Signed)
Clinically improving  CXR today is improved   Plan  Finish Levaquin  Follow up in 2-3 weeks with chest xray .  follow up Dr. Chase Caller in Dec as planned and  As needed   Please contact office for sooner follow up if symptoms do not improve or worsen or seek emergency care

## 2015-06-15 NOTE — Assessment & Plan Note (Signed)
Cont on O2 .  

## 2015-06-15 NOTE — Assessment & Plan Note (Signed)
Recent flare with HCAP , now improving   Plan  Finish Levaquin  Follow up in 2-3 weeks with chest xray .  follow up Dr. Chase Caller in Dec as planned and  As needed   Please contact office for sooner follow up if symptoms do not improve or worsen or seek emergency care

## 2015-06-15 NOTE — Progress Notes (Signed)
   Subjective:    Patient ID: DENNIES COATE, male    DOB: 1928/09/05, 81 y.o.   MRN: [id number]  HPI 82 yo male former smoker with Stage IIA Lung Adenocarcinoma and severe COPD    06/15/2015 post hospital follow-up COPD and Lung Cancer and PNA  Pt returns for a post hospital follow up . He was recently readmitted for healthcare associated pneumonia, acute exacerbation of COPD.  He was treated with IV antibiotics, steroids, and nebulized bronchodilators. . He was suspected to have some esophagitis and was treated with Protonix in am. Diflucan.  Since discharge. Patient is feeling better but remains weak. Has some lingering cough and congestion. Swallowing is better.  Appetite remains low . Taking ensure Three times a day  .  Chest x-ray today does show improved right upper lobe pneumonia. Denies chest pain, orthopnea, edema or fever.       Review of Systems  Constitutional: Negative for fever and unexpected weight change.  HENT: Negative for congestion, dental problem, ear pain, nosebleeds, postnasal drip, rhinorrhea, sinus pressure, sneezing, sore throat and trouble swallowing.   Eyes: Negative for redness and itching.  Respiratory: Positive f  shortness of breath. Negative for chest tightness and wheezing.   Cardiovascular: Negative for palpitations and leg swelling.  Gastrointestinal: Negative for nausea and vomiting.  Genitourinary: Negative for dysuria.  Musculoskeletal: Negative for joint swelling.  Skin: Negative for rash.  Neurological: Negative for headaches.  Hematological: Does not bruise/bleed easily.  Psychiatric/Behavioral: Negative for dysphoric mood. The patient is not nervous/anxious.        Objective:   Physical Exam VS reviewed  GEN: A/Ox3; pleasant , NAD, thin and frail on O2   HEENT:  [place]/AT,  EACs-clear, TMs-wnl, NOSE-clear, THROAT-clear, no lesions, no postnasal drip or exudate noted.   NECK:  Supple w/ fair ROM; no JVD; normal carotid impulses w/o  bruits; no thyromegaly or nodules palpated; no lymphadenopathy.  RESP  Decreased BS in bases .no accessory muscle use, no dullness to percussion  CARD:  RRR, no m/r/g  , no peripheral edema, pulses intact, no cyanosis or clubbing.  GI:   Soft & nt; nml bowel sounds; no organomegaly or masses detected.  Musco: Warm bil, no deformities or joint swelling noted.   Neuro: alert, no focal deficits noted.    Skin: Warm, no lesions or rashes  CXR 06/15/2015 reviewed independently Chest x-ray today does show improved right upper lobe pneumonia.  ' Assessment & Plan:

## 2015-06-16 ENCOUNTER — Ambulatory Visit
Admission: RE | Admit: 2015-06-16 | Discharge: 2015-06-16 | Disposition: A | Discharge status: Home / Self Care | Payer: Medicare Other | Source: Ambulatory Visit | Attending: Radiation Oncology | Admitting: Radiation Oncology

## 2015-06-17 ENCOUNTER — Encounter: Payer: Self-pay | Admitting: Radiation Oncology

## 2015-06-17 ENCOUNTER — Ambulatory Visit
Admission: RE | Admit: 2015-06-17 | Discharge: 2015-06-17 | Disposition: A | Discharge status: Home / Self Care | Payer: Medicare Other | Source: Ambulatory Visit | Attending: Radiation Oncology | Admitting: Radiation Oncology

## 2015-06-17 DIAGNOSIS — Z510 Encounter for antineoplastic radiation therapy: Secondary | ICD-10-CM | POA: Diagnosis not present

## 2015-06-18 ENCOUNTER — Emergency Department (HOSPITAL_COMMUNITY): Payer: Medicare Other

## 2015-06-18 ENCOUNTER — Encounter (HOSPITAL_COMMUNITY): Payer: Self-pay

## 2015-06-18 ENCOUNTER — Emergency Department (HOSPITAL_COMMUNITY)
Admission: EM | Admit: 2015-06-18 | Discharge: 2015-06-19 | Disposition: A | Discharge status: Home / Self Care | Payer: Medicare Other | Attending: Emergency Medicine | Admitting: Emergency Medicine

## 2015-06-18 DIAGNOSIS — Z8701 Personal history of pneumonia (recurrent): Secondary | ICD-10-CM | POA: Insufficient documentation

## 2015-06-18 DIAGNOSIS — M1990 Unspecified osteoarthritis, unspecified site: Secondary | ICD-10-CM | POA: Diagnosis not present

## 2015-06-18 DIAGNOSIS — C3411 Malignant neoplasm of upper lobe, right bronchus or lung: Secondary | ICD-10-CM | POA: Diagnosis not present

## 2015-06-18 DIAGNOSIS — H269 Unspecified cataract: Secondary | ICD-10-CM | POA: Diagnosis not present

## 2015-06-18 DIAGNOSIS — Z7951 Long term (current) use of inhaled steroids: Secondary | ICD-10-CM | POA: Diagnosis not present

## 2015-06-18 DIAGNOSIS — R531 Weakness: Secondary | ICD-10-CM | POA: Diagnosis not present

## 2015-06-18 DIAGNOSIS — Z79899 Other long term (current) drug therapy: Secondary | ICD-10-CM | POA: Insufficient documentation

## 2015-06-18 DIAGNOSIS — J45909 Unspecified asthma, uncomplicated: Secondary | ICD-10-CM | POA: Diagnosis not present

## 2015-06-18 DIAGNOSIS — Z87891 Personal history of nicotine dependence: Secondary | ICD-10-CM | POA: Insufficient documentation

## 2015-06-18 DIAGNOSIS — I10 Essential (primary) hypertension: Secondary | ICD-10-CM | POA: Diagnosis not present

## 2015-06-18 DIAGNOSIS — Z8619 Personal history of other infectious and parasitic diseases: Secondary | ICD-10-CM | POA: Diagnosis not present

## 2015-06-18 LAB — COMPREHENSIVE METABOLIC PANEL
ALK PHOS: 68 U/L (ref 38–126)
ALT: 17 U/L (ref 17–63)
AST: 37 U/L (ref 15–41)
Albumin: 3.3 g/dL — ABNORMAL LOW (ref 3.5–5.0)
Anion gap: 14 (ref 5–15)
BILIRUBIN TOTAL: 1.1 mg/dL (ref 0.3–1.2)
BUN: 29 mg/dL — AB (ref 6–20)
CALCIUM: 9 mg/dL (ref 8.9–10.3)
CO2: 24 mmol/L (ref 22–32)
CREATININE: 1.15 mg/dL (ref 0.61–1.24)
Chloride: 97 mmol/L — ABNORMAL LOW (ref 101–111)
GFR calc Af Amer: >60 mL/min (ref >60)
GFR, EST NON AFRICAN AMERICAN: 57 mL/min — AB (ref >60)
Glucose, Bld: 146 mg/dL — ABNORMAL HIGH (ref 65–99)
Potassium: 3.9 mmol/L (ref 3.5–5.1)
Sodium: 135 mmol/L (ref 135–145)
TOTAL PROTEIN: 6.7 g/dL (ref 6.5–8.1)

## 2015-06-18 LAB — CBC WITH DIFFERENTIAL/PLATELET
BASOS PCT: 0 %
Basophils Absolute: 0 10*3/uL (ref 0.0–0.1)
EOS ABS: 0 10*3/uL (ref 0.0–0.7)
EOS PCT: 0 %
HEMATOCRIT: 36.7 % — AB (ref 39.0–52.0)
Hemoglobin: 12.1 g/dL — ABNORMAL LOW (ref 13.0–17.0)
Lymphocytes Relative: 4 %
Lymphs Abs: 0.4 10*3/uL — ABNORMAL LOW (ref 0.7–4.0)
MCH: 28 pg (ref 26.0–34.0)
MCHC: 33 g/dL (ref 30.0–36.0)
MCV: 85 fL (ref 78.0–100.0)
Monocytes Absolute: 0.1 10*3/uL (ref 0.1–1.0)
Monocytes Relative: 1 %
NEUTROS PCT: 95 %
Neutro Abs: 8.7 10*3/uL — ABNORMAL HIGH (ref 1.7–7.7)
Platelets: 236 10*3/uL (ref 150–400)
RBC: 4.32 MIL/uL (ref 4.22–5.81)
RDW: 20.2 % — ABNORMAL HIGH (ref 11.5–15.5)
WBC: 9.2 10*3/uL (ref 4.0–10.5)

## 2015-06-18 LAB — URINALYSIS, ROUTINE W REFLEX MICROSCOPIC
GLUCOSE, UA: NEGATIVE mg/dL
HGB URINE DIPSTICK: NEGATIVE
KETONES UR: NEGATIVE mg/dL
Leukocytes, UA: NEGATIVE
Nitrite: NEGATIVE
PROTEIN: NEGATIVE mg/dL
Specific Gravity, Urine: 1.024 (ref 1.005–1.030)
Urobilinogen, UA: 1 mg/dL (ref 0.0–1.0)
pH: 5.5 (ref 5.0–8.0)

## 2015-06-18 MED ORDER — SODIUM CHLORIDE 0.9 % IV BOLUS (SEPSIS)
1000.0000 mL | Freq: Once | INTRAVENOUS | Status: AC
  Administered 2015-06-18: 1000 mL via INTRAVENOUS

## 2015-06-18 NOTE — ED Notes (Signed)
Per [facility]: Pt c/o right sided weakness - last seen normal was 11 pm 06/17/2015. Pt was able to ambulate. Pt states that his gate is slower than normal, slight right sided facial droop, slight weakness in his hand but no arm drift. Lack of fine motor skills noted by [facility]. Family knows that the pt was up and active today, but nobody saw him. Pt just finished last radiation tx for lung cancer today.

## 2015-06-18 NOTE — ED Provider Notes (Signed)
CSN: [id number]     Arrival date & time 06/18/15  1940 History   First MD Initiated Contact with Patient 06/18/15 1941     Chief Complaint  Patient presents with  . Weakness     (Consider location/radiation/quality/duration/timing/severity/associated sxs/prior Treatment) HPI..... Generalized weakness today especially in his right hand. Patient has been diagnosed with lung cancer and he finished radiation treatment today. Patient lives at home with his daughter. He has not eaten much today. Review systems positive for cough. Patient was ambulatory at home. No obvious confusion, leg weakness, left upper extremity weakness  Past Medical History  Diagnosis Date  . BPH (benign prostatic hypertrophy)     takes Rapaflo daily  . Chronic airway obstruction, not elsewhere classified   . Asthma     uses Spiriva and Dulera daily  . Emphysema lung ([place])   . Chronic bronchitis ([place])   . Shortness of breath     Albuterol inhaler as needed;lying and exertion  . Pneumonia     "a few times" (08/26/2014)  . Cough     productive but without fever  . Arthritis   . Joint pain   . Cataracts, bilateral     immature  . History of shingles   . Cancer ([place])   . Hypertension    Past Surgical History  Procedure Laterality Date  . No past surgeries    . Video bronchoscopy with endobronchial navigation N/A 03/10/2015    Procedure: VIDEO BRONCHOSCOPY WITH ENDOBRONCHIAL NAVIGATION and PLACEMENT OF 4MM X 7MM FIDUCIAL MARKERS x3;  Surgeon: Collene Gobble, MD;  Location: [facility] OR;  Service: Thoracic;  Laterality: N/A;   Family History  Problem Relation Age of Onset  . COPD Father    Social History  Substance Use Topics  . Smoking status: Former Smoker -- 1.50 packs/day for 45 years    Types: Cigarettes    Quit date: 04/07/2008  . Smokeless tobacco: Former Systems developer    Quit date: 04/07/2008     Comment: quit smoking 3+yrs ago  . Alcohol Use: No     Comment: social drinker prior to sickness    Review of  Systems  All other systems reviewed and are negative.     Allergies  Review of patient's allergies indicates no known allergies.  Home Medications   Prior to Admission medications   Medication Sig Start Date End Date Taking? Authorizing Provider  albuterol (PROVENTIL HFA;VENTOLIN HFA) 108 (90 BASE) MCG/ACT inhaler Inhale 2 puffs into the lungs every 4 (four) hours as needed for wheezing or shortness of breath. 01/27/14  Yes Brand Males, MD  albuterol (PROVENTIL) (2.5 MG/3ML) 0.083% nebulizer solution Take 3 mLs (2.5 mg total) by nebulization every 2 (two) hours as needed for wheezing or shortness of breath. 01/27/14  Yes Brand Males, MD  fluconazole (DIFLUCAN) 40 MG/ML suspension Take 200 mg by mouth daily.   Yes Historical Provider, MD  fluticasone (FLONASE) 50 MCG/ACT nasal spray Place 2 sprays into both nostrils daily. Patient taking differently: Place 2 sprays into both nostrils daily as needed for allergies.  01/11/15  Yes Brand Males, MD  guaiFENesin (MUCINEX) 600 MG 12 hr tablet Take 1 tablet (600 mg total) by mouth 2 (two) times daily. Patient taking differently: Take 600 mg by mouth 2 (two) times daily as needed for cough or to loosen phlegm.  04/12/15  Yes Barton Dubois, MD  HYDROcodone-acetaminophen (HYCET) 7.5-325 mg/15 ml solution Take 10 mLs by mouth 4 (four) times daily as needed for moderate  pain. 05/26/15  Yes Thea Silversmith, MD  HYDROcodone-homatropine (HYDROMET) 5-1.5 MG/5ML syrup Take 2.5 mLs by mouth at bedtime as needed. Patient taking differently: Take 2.5 mLs by mouth at bedtime as needed for cough.  03/12/15  Yes Collene Gobble, MD  levofloxacin (LEVAQUIN) 750 MG tablet Take 1 tablet (750 mg total) by mouth daily. 06/09/15  Yes Erick Colace, NP  Magnesium Hydroxide (MILK OF MAGNESIA PO) Take 1 Dose by mouth daily as needed (constipation).   Yes Historical Provider, MD  mometasone-formoterol (DULERA) 200-5 MCG/ACT AERO Inhale 2 puffs into the lungs 2 (two)  times daily. 03/15/15  Yes Brand Males, MD  Nutritional Supplements (ENSURE HIGH PROTEIN) LIQD Take 1 Bottle by mouth 3 (three) times daily. To be taken 1 bottle three times a day between meals; not as meal substitution.. 04/12/15  Yes Barton Dubois, MD  pantoprazole (PROTONIX) 40 MG tablet Take 1 tablet (40 mg total) by mouth daily at 12 noon. 06/09/15  Yes Erick Colace, NP  polyethylene glycol (MIRALAX / GLYCOLAX) packet Take 17 g by mouth daily. Hold for diarrhea Patient taking differently: Take 17 g by mouth daily as needed for mild constipation or moderate constipation. Hold for diarrhea 04/12/15  Yes Barton Dubois, MD  predniSONE (DELTASONE) 10 MG tablet Take 4 tabs  daily with food x 4 days, then 3 tabs daily x 4 days, then 2 tabs daily x 4 days, then 1 tab daily x4 days then stop. #40 06/09/15  Yes Erick Colace, NP  prochlorperazine (COMPAZINE) 10 MG tablet Take 1 tablet (10 mg total) by mouth every 6 (six) hours as needed for nausea or vomiting. 04/26/15  Yes Curt Bears, MD  RAPAFLO 8 MG CAPS capsule Take 1 capsule by mouth daily. 03/04/14  Yes Historical Provider, MD  Tiotropium Bromide Monohydrate (SPIRIVA RESPIMAT) 2.5 MCG/ACT AERS Inhale 2 puffs into the lungs daily. 04/09/15  Yes Brand Males, MD  zolpidem (AMBIEN) 5 MG tablet Take 1 tablet (5 mg total) by mouth at bedtime as needed for sleep. 06/09/15  Yes Erick Colace, NP  sucralfate (CARAFATE) 1 G tablet Take 1 tab and dissolve in 6 oz water tid prn for swallowing. 05/26/15   Thea Silversmith, MD   BP 131/62 mmHg  Pulse 79  Temp(Src) 98.1 F (36.7 C) (Oral)  Resp 17  SpO2 100% Physical Exam  Constitutional: He is oriented to person, place, and time.  Coughing, appears dehydrated  HENT:  Head: Normocephalic and atraumatic.  Eyes: Conjunctivae and EOM are normal. Pupils are equal, round, and reactive to light.  Neck: Normal range of motion. Neck supple.  Cardiovascular: Normal rate and regular rhythm.    Pulmonary/Chest: Effort normal and breath sounds normal.  Abdominal: Soft. Bowel sounds are normal.  Musculoskeletal: Normal range of motion.  Neurological: He is alert and oriented to person, place, and time.  Decreased grip/ROM in right hand.  Skin: Skin is warm and dry.  Psychiatric: He has a normal mood and affect. His behavior is normal.  Nursing note and vitals reviewed.   ED Course  Procedures (including critical care time) Labs Review Labs Reviewed  CBC WITH DIFFERENTIAL/PLATELET - Abnormal; Notable for the following:    Hemoglobin 12.1 (*)    HCT 36.7 (*)    RDW 20.2 (*)    Neutro Abs 8.7 (*)    Lymphs Abs 0.4 (*)    All other components within normal limits  COMPREHENSIVE METABOLIC PANEL - Abnormal; Notable for the following:  Chloride 97 (*)    Glucose, Bld 146 (*)    BUN 29 (*)    Albumin 3.3 (*)    GFR calc non Af Amer 57 (*)    All other components within normal limits  URINALYSIS, ROUTINE W REFLEX MICROSCOPIC (NOT AT [facility]) - Abnormal; Notable for the following:    Bilirubin Urine SMALL (*)    All other components within normal limits  CULTURE, BLOOD (ROUTINE X 2)  CULTURE, BLOOD (ROUTINE X 2)    Imaging Review Ct Head Wo Contrast  06/18/2015  CLINICAL DATA:  Right hand weakness today. Right-sided numbness and weakness. History of lung cancer. EXAM: CT HEAD WITHOUT CONTRAST TECHNIQUE: Contiguous axial images were obtained from the base of the skull through the vertex without intravenous contrast. COMPARISON:  Brain MRI 04/27/2015 FINDINGS: No evidence of intracranial mass. No intracranial hemorrhage, mass effect, or midline shift. Atrophy and advanced chronic small vessel ischemic change, unchanged allowing for differences in modality. No hydrocephalus. The basilar cisterns are patent. No evidence of territorial infarct. No intracranial fluid collection. Calvarium is intact. Scattered opacification of lower bilateral mastoid air cells and minimal ethmoid air  cells. No fluid levels. No focal calvarial lesion. IMPRESSION: 1. Atrophy and chronic small vessel ischemic change. No acute intracranial abnormality. 2. No findings of metastatic disease on noncontrast head CT. Electronically Signed   By: Jeb Levering M.D.   On: 06/18/2015 22:26   Dg Chest Port 1 View  06/18/2015  CLINICAL DATA:  Cough for 1 day.  History of lung cancer. EXAM: PORTABLE CHEST 1 VIEW COMPARISON:  Radiograph 06/15/2015.  PET-CT 04/16/2015 FINDINGS: Hyperinflation and emphysema again seen. Continued improvement in right upper lobe opacity with minimal residual streaky density. This is in the region of the inferior fiducial marker/clip. No new consolidation. Cardiomediastinal contours are unchanged, heart size is normal. No large pleural effusion. No pneumothorax. Osseous structures are stable. IMPRESSION: 1. Continued improvement in right upper lobe opacity. 2. No new airspace disease suggest new or progressive pneumonia. Electronically Signed   By: Jeb Levering M.D.   On: 06/18/2015 21:10   I have personally reviewed and evaluated these images and lab results as part of my medical decision-making.   EKG Interpretation None      MDM   Final diagnoses:  Weakness  Malignant neoplasm of upper lobe of right lung [facility])    Patient feels better after IV fluids. Screening tests including labs, urinalysis, CT head, chest x-ray showing no acute findings. He may have had a stroke affecting his right hand. I discussed these findings with the patient and his children. He would prefer to be treated as an outpatient. He feels much better at discharge.    Nat Christen, MD 06/18/15 [phone]

## 2015-06-18 NOTE — ED Notes (Signed)
Recent hospitalization for pneumonia, discharged last Thursday.

## 2015-06-18 NOTE — Discharge Instructions (Signed)
Tests showed no life-threatening condition. Follow-up your primary care doctor or return here if worse.

## 2015-06-18 NOTE — ED Notes (Signed)
Patient transported to CT 

## 2015-06-23 LAB — CULTURE, BLOOD (ROUTINE X 2)
Culture: NO GROWTH
Culture: NO GROWTH

## 2015-06-25 ENCOUNTER — Ambulatory Visit: Payer: Medicare Other

## 2015-06-29 ENCOUNTER — Ambulatory Visit (INDEPENDENT_AMBULATORY_CARE_PROVIDER_SITE_OTHER): Payer: Medicare Other | Admitting: Adult Health

## 2015-06-29 ENCOUNTER — Ambulatory Visit (INDEPENDENT_AMBULATORY_CARE_PROVIDER_SITE_OTHER)
Admission: RE | Admit: 2015-06-29 | Discharge: 2015-06-29 | Disposition: A | Discharge status: Home / Self Care | Payer: Medicare Other | Source: Ambulatory Visit | Attending: Adult Health | Admitting: Adult Health

## 2015-06-29 ENCOUNTER — Encounter: Payer: Self-pay | Admitting: Adult Health

## 2015-06-29 VITALS — BP 132/80 | HR 107 | Temp 98.1°F | Ht 67.0 in | Wt 88.0 lb

## 2015-06-29 DIAGNOSIS — J189 Pneumonia, unspecified organism: Secondary | ICD-10-CM | POA: Diagnosis not present

## 2015-06-29 DIAGNOSIS — J9611 Chronic respiratory failure with hypoxia: Secondary | ICD-10-CM | POA: Diagnosis not present

## 2015-06-29 DIAGNOSIS — J449 Chronic obstructive pulmonary disease, unspecified: Secondary | ICD-10-CM | POA: Diagnosis not present

## 2015-06-29 MED ORDER — MEGESTROL ACETATE 40 MG/ML PO SUSP: 200.0000 mg | Freq: Two times a day (BID) | ORAL | Status: DC

## 2015-06-29 NOTE — Progress Notes (Signed)
Subjective:    Patient ID: Frank Garcia, male    DOB: 18-Sep-1928, 81 y.o.   MRN: [id number]  HPI 81 yo male former smoker with Stage IIA Lung Adenocarcinoma and severe COPD   TEST /Significant Events:  CT scan of the chest on 02/04/2015 there was increase in the size of 0.8 x 0.9 x 1.5 cm nodule in the right lower lobe  PET scan was performed on 02/15/2015 and it showed hypermetabolic right upper lobe pulmonary nodule suggesting primary lung neoplasm.  03/10/2015 the patient underwent bronchoscopy with electromagnetic navigational bronchoscopy  Path +non small cell carcinoma favoring adenocarcinoma  04/30/15 XRT started >finished 06/17/15  04/27/15 Chemo started (carboplatin and pacitaxel)   06/29/2015 Follow up COPD and Lung Cancer and PNA  Pt returns for a 2 week follow up for recent COPD flare and HCAP  He was treated with IV antibiotics, steroids, and nebulized bronchodilators. Marland Kitchen   He remains very weak, appetite is very low . Wt is down 7 lbs (wt is 88lbs today )  Says he has no appetite . Says he feels he is getting worse.  Went to ER on 06/18/15 for weakness, and right hand weakness.  CT head  showed chronic changes w/ no acute issues. (no acute CVA )  Right hand weakness lasted for 2 weeks. Near normal now.  Denies chest pain, orthopnea, edema or fever, hemoptysis  Denies swallow issues. Encouraged to drink ensure as recommended previously.   CXR today shows increased area along right mid lung .    Received last XRT 06/17/15  For unresctable RUL lung cancer  He has finished 6 cycles of concurrent chemo (weekly carboplatin and pacitaxel)  He has a follow up CT in 3 weeks with Dr. Earlie Server.   EOL discussion with pt and daughter . Long discussion regarding living will, and code status.  Pt education was given regarding his underlying severe COPD status , and lung cancer.  They both want full code status .    Past Medical History  Diagnosis Date  . BPH (benign prostatic  hypertrophy)     takes Rapaflo daily  . Chronic airway obstruction, not elsewhere classified   . Asthma     uses Spiriva and Dulera daily  . Emphysema lung ([place])   . Chronic bronchitis ([place])   . Shortness of breath     Albuterol inhaler as needed;lying and exertion  . Pneumonia     "a few times" (08/26/2014)  . Cough     productive but without fever  . Arthritis   . Joint pain   . Cataracts, bilateral     immature  . History of shingles   . Cancer ([place])   . Hypertension     Current Outpatient Prescriptions on File Prior to Visit  Medication Sig Dispense Refill  . albuterol (PROVENTIL) (2.5 MG/3ML) 0.083% nebulizer solution Take 3 mLs (2.5 mg total) by nebulization every 2 (two) hours as needed for wheezing or shortness of breath. 540 mL 4  . fluconazole (DIFLUCAN) 40 MG/ML suspension Take 200 mg by mouth daily.    . fluticasone (FLONASE) 50 MCG/ACT nasal spray Place 2 sprays into both nostrils daily. (Patient taking differently: Place 2 sprays into both nostrils daily as needed for allergies. ) 16 g 5  . guaiFENesin (MUCINEX) 600 MG 12 hr tablet Take 1 tablet (600 mg total) by mouth 2 (two) times daily. (Patient taking differently: Take 600 mg by mouth 2 (two) times daily as needed for  cough or to loosen phlegm. ) 30 tablet 0  . HYDROcodone-acetaminophen (HYCET) 7.5-325 mg/15 ml solution Take 10 mLs by mouth 4 (four) times daily as needed for moderate pain. 473 mL 0  . HYDROcodone-homatropine (HYDROMET) 5-1.5 MG/5ML syrup Take 2.5 mLs by mouth at bedtime as needed. (Patient taking differently: Take 2.5 mLs by mouth at bedtime as needed for cough. ) 240 mL 0  . Magnesium Hydroxide (MILK OF MAGNESIA PO) Take 1 Dose by mouth daily as needed (constipation).    . mometasone-formoterol (DULERA) 200-5 MCG/ACT AERO Inhale 2 puffs into the lungs 2 (two) times daily. 2 Inhaler 0  . Nutritional Supplements (ENSURE HIGH PROTEIN) LIQD Take 1 Bottle by mouth 3 (three) times daily. To be taken 1  bottle three times a day between meals; not as meal substitution.. 14481 mL 3  . pantoprazole (PROTONIX) 40 MG tablet Take 1 tablet (40 mg total) by mouth daily at 12 noon. 60 tablet 3  . polyethylene glycol (MIRALAX / GLYCOLAX) packet Take 17 g by mouth daily. Hold for diarrhea (Patient taking differently: Take 17 g by mouth daily as needed for mild constipation or moderate constipation. Hold for diarrhea) 28 each 0  . RAPAFLO 8 MG CAPS capsule Take 1 capsule by mouth daily.    . Tiotropium Bromide Monohydrate (SPIRIVA RESPIMAT) 2.5 MCG/ACT AERS Inhale 2 puffs into the lungs daily. 3 Inhaler 1  . zolpidem (AMBIEN) 5 MG tablet Take 1 tablet (5 mg total) by mouth at bedtime as needed for sleep. 30 tablet 0  . albuterol (PROVENTIL HFA;VENTOLIN HFA) 108 (90 BASE) MCG/ACT inhaler Inhale 2 puffs into the lungs every 4 (four) hours as needed for wheezing or shortness of breath. 1 Inhaler 3  . levofloxacin (LEVAQUIN) 750 MG tablet Take 1 tablet (750 mg total) by mouth daily. (Patient not taking: Reported on 06/29/2015) 6 tablet 0  . predniSONE (DELTASONE) 10 MG tablet Take 4 tabs  daily with food x 4 days, then 3 tabs daily x 4 days, then 2 tabs daily x 4 days, then 1 tab daily x4 days then stop. #40 (Patient not taking: Reported on 06/29/2015) 40 tablet 0  . prochlorperazine (COMPAZINE) 10 MG tablet Take 1 tablet (10 mg total) by mouth every 6 (six) hours as needed for nausea or vomiting. (Patient not taking: Reported on 06/29/2015) 30 tablet 0  . sucralfate (CARAFATE) 1 G tablet Take 1 tab and dissolve in 6 oz water tid prn for swallowing. (Patient not taking: Reported on 06/29/2015) 60 tablet 1   No current facility-administered medications on file prior to visit.    Review of Systems  Constitutional: Negative for fever,  HENT: Negative for congestion, dental problem, ear pain, nosebleeds, postnasal drip, rhinorrhea, sinus pressure, sneezing, sore throat and trouble swallowing.   Eyes: Negative for redness  and itching.  Respiratory: Positive for shortness of breath. Negative for chest tightness and wheezing.   Cardiovascular: Negative for palpitations and leg swelling.  Gastrointestinal: Negative for nausea and vomiting.  Genitourinary: Negative for dysuria.  Musculoskeletal: Negative for joint swelling.  Skin: Negative for rash.  Neurological: Negative for headaches.  Hematological: Does not bruise/bleed easily.  Psychiatric/Behavioral: Negative for dysphoric mood. The patient is not nervous/anxious.        Objective:   Physical Exam VS reviewed  GEN: A/Ox3; pleasant , NAD, thin and frail on O2   HEENT:  [place]/AT,  EACs-clear, TMs-wnl, NOSE-clear, THROAT-clear, no lesions, no postnasal drip or exudate noted. Very poor dentition  NECK:  Supple w/ fair ROM; no JVD; normal carotid impulses w/o bruits; no thyromegaly or nodules palpated; no lymphadenopathy.  RESP  Decreased BS in bases .no accessory muscle use, no dullness to percussion  CARD:  RRR, no m/r/g  , no peripheral edema, pulses intact, no cyanosis or clubbing.  GI:   Soft & nt; nml bowel sounds; no organomegaly or masses detected.  Musco: Warm bil, no deformities or joint swelling noted.   Neuro: alert, no focal deficits noted.    Skin: Warm, no lesions or rashes    CXR 06/29/2015 reviewed independently Interval worsening of interstitial infiltrate in the inferior aspect of the right upper lobe. The findings are similar to those originally demonstrated on June 07, 2015. This is suspicious for recurrent pneumonia. There is also minimal increased interstitial density in the left lung as compared to the previous study.   ' Assessment & Plan:

## 2015-06-29 NOTE — Patient Instructions (Addendum)
Set up for CT chest this week.  May try megace Twice daily  Before meal to help with appetite .  Follow up Dr. Chase Caller in Dec as planned and  As needed   Follow up with Oncology this month as planned and As needed   Please contact office for sooner follow up if symptoms do not improve or worsen or seek emergency care

## 2015-07-01 ENCOUNTER — Ambulatory Visit (INDEPENDENT_AMBULATORY_CARE_PROVIDER_SITE_OTHER)
Admission: RE | Admit: 2015-07-01 | Discharge: 2015-07-01 | Disposition: A | Discharge status: Home / Self Care | Payer: Medicare Other | Source: Ambulatory Visit | Attending: Adult Health | Admitting: Adult Health

## 2015-07-01 ENCOUNTER — Telehealth: Payer: Self-pay | Admitting: Internal Medicine

## 2015-07-01 ENCOUNTER — Other Ambulatory Visit: Payer: Self-pay | Admitting: Adult Health

## 2015-07-01 DIAGNOSIS — J189 Pneumonia, unspecified organism: Secondary | ICD-10-CM

## 2015-07-01 DIAGNOSIS — R531 Weakness: Secondary | ICD-10-CM

## 2015-07-01 DIAGNOSIS — J449 Chronic obstructive pulmonary disease, unspecified: Secondary | ICD-10-CM

## 2015-07-01 MED ORDER — PREDNISONE 10 MG PO TABS: ORAL_TABLET | ORAL | Status: DC

## 2015-07-01 NOTE — Assessment & Plan Note (Signed)
Recent admission w/ completed course . -remains very weak with underlying lung cancer -finished XRT 2 weeks ago. Concurrent chemo on hold with pending CT chest this month.  For now continue on current regimen , will add megace to help with appetite .  Check CT chest this week to evaluate worsening area along right lung ? Radiation changes vs worsening PNA/aspiraton    Plan  Set up for CT chest this week.  May try megace Twice daily  Before meal to help with appetite .  Follow up Dr. Chase Caller in Dec as planned and  As needed   Follow up with Oncology this month as planned and As needed   Please contact office for sooner follow up if symptoms do not improve or worsen or seek emergency care

## 2015-07-01 NOTE — Telephone Encounter (Signed)
Spoke with Frank Garcia She is calling to let us know that due to the pt feeling so weak, he may not be able to have the ct chest done today  He started taking megace, and this is not helping much with appetite  She wanted MR to be aware that CT may have to be rescheduled  Will forward to MR as [facility]

## 2015-07-01 NOTE — Assessment & Plan Note (Signed)
Cont on Oxygen  

## 2015-07-01 NOTE — Assessment & Plan Note (Signed)
Recent flare -remains very weak with underlying lung cancer -finished XRT 2 weeks ago. Concurrent chemo on hold with pending CT chest this month.  For now continue on current regimen , will add megace to help with appetite .  Check CT chest this week to evaluate worsening area along right lung ? Radiation changes vs worsening PNA/aspiraton   Plan  Set up for CT chest this week.  May try megace Twice daily  Before meal to help with appetite .  Follow up Dr. Chase Caller in Dec as planned and  As needed   Follow up with Oncology this month as planned and As needed   Please contact office for sooner follow up if symptoms do not improve or worsen or seek emergency care

## 2015-07-01 NOTE — Telephone Encounter (Signed)
Pt was arrived at CT and having this done today. Spoke with [place]. She reports pt does not have in home PT and would like this ordered. I have done so.

## 2015-07-01 NOTE — Telephone Encounter (Signed)
Ok - if you getting in-home physical therapy. With a want to do that if they're not getting in-home physical therapy? If so go ahead and order it

## 2015-07-02 ENCOUNTER — Telehealth: Payer: Self-pay | Admitting: Internal Medicine

## 2015-07-02 NOTE — Progress Notes (Signed)
Quick Note:  Per TP: CT on 07/21/15 can be canceled.  Called and spoke with pt. Reviewed TP's recs.  Pt voiced understanding and had no further questions. Nothing further needed ______

## 2015-07-02 NOTE — Telephone Encounter (Signed)
Frank Garcia from [facility] called regarding PT.  Frank Garcia says that she spoke with daughter, the daughter says that she did not know anything about the referral.  Daughter says that she will have to speak to her father to see if he is willing to do PT.  Frank Garcia says that [facility] Physical therapist will call them back on Monday to follow up and see if they are willing to go through with PT.  FYI to Dr. Chase Caller

## 2015-07-02 NOTE — Progress Notes (Signed)
Quick Note:  Spoke with pt's daughter Kenney Houseman, I verified that he has an ov with MR on 08/19/15. She confirmed and I explained that TP would like for her to keep this ov. She stated that TP did discuss the CT results on 07/01/15. She asked if the pt still needed the CT scheduled on 07/21/15 with Dr. Worthy Flank office. I told her that I would discuss keeping the CT with TP and call her back with TP's recs. ______

## 2015-07-05 ENCOUNTER — Other Ambulatory Visit: Payer: Self-pay | Admitting: Internal Medicine

## 2015-07-06 ENCOUNTER — Telehealth: Payer: Self-pay | Admitting: Internal Medicine

## 2015-07-06 MED ORDER — AMOXICILLIN-POT CLAVULANATE 875-125 MG PO TABS: 1.0000 | ORAL_TABLET | Freq: Two times a day (BID) | ORAL | Status: DC

## 2015-07-06 MED ORDER — PREDNISONE 10 MG PO TABS: ORAL_TABLET | ORAL | Status: DC

## 2015-07-06 NOTE — Telephone Encounter (Signed)
Augmentin 875 mg take one pill twice daily  X 10 days - take at breakfast and supper with large glass of water.  It would help reduce the usual side effects (diarrhea and yeast infections) if you ate cultured yogurt at lunch.   Prednisone 10 mg take  4 each am x 2 days,   2 each am x 2 days,  1 each am x 2 days and stop  

## 2015-07-06 NOTE — Telephone Encounter (Signed)
Called and spoke with pt Pt c/o worsening SOB with ambulation, chest congestion, productive cough with yellow sputum, and increased heart rate 112-125 x 2 days Pt denies fever, n/v, chills, diarrhea Pt would like something called into his pharmacy  Will forward to DOD in MR absence. Dr Melvyn Novas, please advise. Thanks

## 2015-07-06 NOTE — Telephone Encounter (Signed)
Called pt and informed of MW rec Pt voiced understanding of rec  Informed pt that medications would be sent to pharmacy Pharmacy verified with pt  Medication sent electronically  Nothing further is needed

## 2015-07-12 ENCOUNTER — Telehealth: Payer: Self-pay | Admitting: Internal Medicine

## 2015-07-12 DIAGNOSIS — R0602 Shortness of breath: Secondary | ICD-10-CM

## 2015-07-12 MED ORDER — PREDNISONE 10 MG PO TABS: ORAL_TABLET | ORAL | Status: DC

## 2015-07-12 NOTE — Telephone Encounter (Signed)
Patient has been coughing very hard.  No energy.  Starting to eat now, but will not drink anything.  Sleeps all the time.  Some tightness in chest, but keep heating pad on his chest and says it feels better with the heat on it.  Currently taking an antibiotic.  Started last week.  Taking 1 prednisone daily, '10mg'$ .    [facility] [place]  No Known Allergies

## 2015-07-12 NOTE — Telephone Encounter (Signed)
Called and spoke to pt's daughter, Kenney Houseman. Informed her of the recs per MR. Rx sent to preferred pharmacy. Tonya verbalized understanding and denied any further questions or concerns at this time.   Pt's next appt is on 08/19/15 with MR.

## 2015-07-12 NOTE — Telephone Encounter (Signed)
Call in a  pred burst to see if this would help Please take Take prednisone '40mg'$  once daily x 3 days, then '30mg'$  once daily x 3 days, then '20mg'$  once daily x 3 days, then prednisone '10mg'$  once daily   If not helping, go to ER  .When is his next appt with me? I am worried he is declining

## 2015-07-12 NOTE — Telephone Encounter (Signed)
Orders entered for CBC, BMET, MAG, PHOS, LFT , CXR  Attempted to contact daughter to advise her that patient needs labs. Left message to call back.

## 2015-07-12 NOTE — Telephone Encounter (Signed)
Ok thanks. Please get me daughter's number

## 2015-07-12 NOTE — Telephone Encounter (Signed)
Patient's daughter notified of labs, cxr orders.  She said that she will bring him by tomorrow to have done. Nothing further needed. Closing encounter

## 2015-07-12 NOTE — Telephone Encounter (Signed)
D/w datr - says 1 months ago was walking. Past few weeks overall decline. Now in bed. ECOG 4 - sleeps all the time and refuses food.Very poor apeptier but appetite better. Lot of fatigue  Exaoplined to [place]  - ddx is cancer/copd/pna fatigue  V terminal decline  Plan  - please get him to do cbc, bmet, mag, phos,. LFT, cxr this weeks  Dr. Brand Males, M.D., [facility].C.P Pulmonary and Critical Care Medicine Staff Physician [place] Pager: [phone], If no answer or between  15:00h - 7:00h: call [phone]  07/12/2015 3:59 PM

## 2015-07-12 NOTE — Telephone Encounter (Signed)
Pt daughter cb, [phone]

## 2015-07-12 NOTE — Telephone Encounter (Signed)
Frank Garcia, pt's daughter, home number is: [phone] and mobile number is: [phone].

## 2015-07-13 ENCOUNTER — Other Ambulatory Visit (INDEPENDENT_AMBULATORY_CARE_PROVIDER_SITE_OTHER): Payer: Medicare Other

## 2015-07-13 ENCOUNTER — Telehealth: Payer: Self-pay | Admitting: Internal Medicine

## 2015-07-13 DIAGNOSIS — R0602 Shortness of breath: Secondary | ICD-10-CM | POA: Diagnosis not present

## 2015-07-13 LAB — BASIC METABOLIC PANEL
BUN: 23 mg/dL (ref 6–23)
CALCIUM: 9.6 mg/dL (ref 8.4–10.5)
CO2: 30 meq/L (ref 19–32)
Chloride: 100 mEq/L (ref 96–112)
Creatinine, Ser: 0.84 mg/dL (ref 0.40–1.50)
GFR: 111.86 mL/min (ref >60.00)
Glucose, Bld: 100 mg/dL — ABNORMAL HIGH (ref 70–99)
Potassium: 4.3 mEq/L (ref 3.5–5.1)
SODIUM: 141 meq/L (ref 135–145)

## 2015-07-13 LAB — HEPATIC FUNCTION PANEL
ALBUMIN: 3.3 g/dL — AB (ref 3.5–5.2)
ALT: 16 U/L (ref 0–53)
AST: 16 U/L (ref 0–37)
Alkaline Phosphatase: 71 U/L (ref 39–117)
Bilirubin, Direct: 0.3 mg/dL (ref 0.0–0.3)
Total Bilirubin: 1.1 mg/dL (ref 0.2–1.2)
Total Protein: 7.4 g/dL (ref 6.0–8.3)

## 2015-07-13 LAB — PHOSPHORUS: PHOSPHORUS: 3.1 mg/dL (ref 2.3–4.6)

## 2015-07-13 LAB — CBC WITH DIFFERENTIAL/PLATELET
BASOS ABS: 0 10*3/uL (ref 0.0–0.1)
Basophils Relative: 0 % (ref 0.0–3.0)
Eosinophils Absolute: 0 10*3/uL (ref 0.0–0.7)
Eosinophils Relative: 0.3 % (ref 0.0–5.0)
HEMATOCRIT: 36.6 % — AB (ref 39.0–52.0)
Hemoglobin: 11.6 g/dL — ABNORMAL LOW (ref 13.0–17.0)
Lymphocytes Relative: 2.8 % — ABNORMAL LOW (ref 12.0–46.0)
Lymphs Abs: 0.5 10*3/uL — ABNORMAL LOW (ref 0.7–4.0)
MCHC: 31.8 g/dL (ref 30.0–36.0)
MCV: 89.2 fl (ref 78.0–100.0)
MONOS PCT: 2.8 % — AB (ref 3.0–12.0)
Monocytes Absolute: 0.5 10*3/uL (ref 0.1–1.0)
NEUTROS ABS: 16.9 10*3/uL — AB (ref 1.4–7.7)
Neutrophils Relative %: 94.1 % — ABNORMAL HIGH (ref 43.0–77.0)
RBC: 4.11 Mil/uL — ABNORMAL LOW (ref 4.22–5.81)
RDW: 21.6 % — AB (ref 11.5–15.5)
WBC: 18 10*3/uL (ref 4.0–10.5)

## 2015-07-13 LAB — MAGNESIUM: Magnesium: 2.2 mg/dL (ref 1.5–2.5)

## 2015-07-13 NOTE — Telephone Encounter (Signed)
Received a critical lab value this am- WBC- 18 Will forward to MR to make him aware

## 2015-07-13 NOTE — Telephone Encounter (Signed)
Spoke with pt's daughter, aware of recs.  States pt is not running a fever but is feeling unwell.  Scheduled pt tomorrow at 11:15 to see TP.  Nothing further needed.

## 2015-07-13 NOTE — Telephone Encounter (Signed)
Reviewed all his labs white count is indeed high. If he is feeling unwell or having fever he should go to the emergency room otherwise try to have him see Patricia Nettle in the next 2 days.    PULMONARY No results for input(s): PHART, PCO2ART, PO2ART, HCO3, TCO2, O2SAT in the last 168 hours.  Invalid input(s): PCO2, PO2  CBC  Recent Labs Lab 07/13/15 0804  HGB 11.6*  HCT 36.6*  WBC 18.0*  PLT 367.0 Result may be falsely decreased due to platelet clumping.    COAGULATION No results for input(s): INR in the last 168 hours.  CARDIAC  No results for input(s): TROPONINI in the last 168 hours. No results for input(s): PROBNP in the last 168 hours.   CHEMISTRY  Recent Labs Lab 07/13/15 0804  NA 141  K 4.3  CL 100  CO2 30  GLUCOSE 100*  BUN 23  CREATININE 0.84  CALCIUM 9.6  MG 2.2  PHOS 3.1   Estimated Creatinine Clearance: 36.9 mL/min (by C-G formula based on Cr of 0.84).   LIVER  Recent Labs Lab 07/13/15 0804  AST 16  ALT 16  ALKPHOS 71  BILITOT 1.1  PROT 7.4  ALBUMIN 3.3*     INFECTIOUS No results for input(s): LATICACIDVEN, PROCALCITON in the last 168 hours.   ENDOCRINE CBG (last 3)  No results for input(s): GLUCAP in the last 72 hours.       IMAGING x48h  - image(s) personally visualized  -   highlighted in bold No results found.

## 2015-07-14 ENCOUNTER — Ambulatory Visit (INDEPENDENT_AMBULATORY_CARE_PROVIDER_SITE_OTHER)
Admission: RE | Admit: 2015-07-14 | Discharge: 2015-07-14 | Disposition: A | Discharge status: Home / Self Care | Payer: Medicare Other | Source: Ambulatory Visit | Attending: Adult Health | Admitting: Adult Health

## 2015-07-14 ENCOUNTER — Encounter: Payer: Self-pay | Admitting: Adult Health

## 2015-07-14 ENCOUNTER — Other Ambulatory Visit (INDEPENDENT_AMBULATORY_CARE_PROVIDER_SITE_OTHER): Payer: Medicare Other

## 2015-07-14 ENCOUNTER — Telehealth: Payer: Self-pay | Admitting: Adult Health

## 2015-07-14 ENCOUNTER — Ambulatory Visit (INDEPENDENT_AMBULATORY_CARE_PROVIDER_SITE_OTHER): Payer: Medicare Other | Admitting: Adult Health

## 2015-07-14 VITALS — BP 106/78 | HR 114 | Temp 98.7°F | Ht 67.0 in | Wt 92.0 lb

## 2015-07-14 DIAGNOSIS — C3411 Malignant neoplasm of upper lobe, right bronchus or lung: Secondary | ICD-10-CM

## 2015-07-14 DIAGNOSIS — J449 Chronic obstructive pulmonary disease, unspecified: Secondary | ICD-10-CM | POA: Diagnosis not present

## 2015-07-14 DIAGNOSIS — J9611 Chronic respiratory failure with hypoxia: Secondary | ICD-10-CM

## 2015-07-14 DIAGNOSIS — R3911 Hesitancy of micturition: Secondary | ICD-10-CM

## 2015-07-14 DIAGNOSIS — J189 Pneumonia, unspecified organism: Secondary | ICD-10-CM

## 2015-07-14 LAB — URINALYSIS, ROUTINE W REFLEX MICROSCOPIC
Bilirubin Urine: NEGATIVE
Hgb urine dipstick: NEGATIVE
LEUKOCYTES UA: NEGATIVE
Nitrite: NEGATIVE
PH: 6 (ref 5.0–8.0)
Specific Gravity, Urine: 1.025 (ref 1.000–1.030)
TOTAL PROTEIN, URINE-UPE24: 100 — AB
UROBILINOGEN UA: 1 (ref 0.0–1.0)
Urine Glucose: NEGATIVE

## 2015-07-14 MED ORDER — LEVOFLOXACIN 500 MG PO TABS: 500.0000 mg | ORAL_TABLET | Freq: Every day | ORAL | Status: DC

## 2015-07-14 NOTE — Patient Instructions (Addendum)
Finish Augmentin as directed  Mucinex DM Twice daily  As needed  Cough/congestion  Increase Prednisone '20mg'$  daily  Ensure Twice daily   Try to get in some water.  Check Chest xray and urine today.  Follow up in 4 weeks with Dr. Chase Caller as planned and As needed   Please contact office for sooner follow up if symptoms do not improve or worsen or seek emergency care

## 2015-07-14 NOTE — Telephone Encounter (Signed)
TP discussed cxr results with pt and patient's daughter cxr compatible with pneumonia. Pt's daughter stated that her father had become weaker and that she would like to take him to the hospital if he agreed.  Once discussed with the patient the daughter stated he refused to go to the hospital.  Stating that he would like to try antibiotics and if symptoms did not improve he would go to the hospital in a day or so.   Per TP: Send Levaquin '500mg'$  Once a day #10 with no refills  Called and spoke with Kenney Houseman the patient's daughter. Verified pharmacy as [facility] on [address]. Rx sent to the pharmacy. Daughter aware. Nothing further needed. Will sign off

## 2015-07-15 ENCOUNTER — Inpatient Hospital Stay (HOSPITAL_COMMUNITY): Payer: Medicare Other

## 2015-07-15 ENCOUNTER — Inpatient Hospital Stay (HOSPITAL_COMMUNITY)
Admission: EM | Admit: 2015-07-15 | Discharge: 2015-07-19 | DRG: 193 | Disposition: A | Discharge status: Home Health | Payer: Medicare Other | Attending: Internal Medicine | Admitting: Internal Medicine

## 2015-07-15 ENCOUNTER — Encounter (HOSPITAL_COMMUNITY): Payer: Self-pay | Admitting: Emergency Medicine

## 2015-07-15 DIAGNOSIS — J449 Chronic obstructive pulmonary disease, unspecified: Secondary | ICD-10-CM | POA: Diagnosis not present

## 2015-07-15 DIAGNOSIS — Z79899 Other long term (current) drug therapy: Secondary | ICD-10-CM

## 2015-07-15 DIAGNOSIS — R000 Tachycardia, unspecified: Secondary | ICD-10-CM | POA: Diagnosis present

## 2015-07-15 DIAGNOSIS — Z8701 Personal history of pneumonia (recurrent): Secondary | ICD-10-CM | POA: Diagnosis not present

## 2015-07-15 DIAGNOSIS — Z9221 Personal history of antineoplastic chemotherapy: Secondary | ICD-10-CM | POA: Diagnosis not present

## 2015-07-15 DIAGNOSIS — J440 Chronic obstructive pulmonary disease with acute lower respiratory infection: Secondary | ICD-10-CM | POA: Diagnosis present

## 2015-07-15 DIAGNOSIS — Z515 Encounter for palliative care: Secondary | ICD-10-CM | POA: Insufficient documentation

## 2015-07-15 DIAGNOSIS — N400 Enlarged prostate without lower urinary tract symptoms: Secondary | ICD-10-CM | POA: Diagnosis not present

## 2015-07-15 DIAGNOSIS — D638 Anemia in other chronic diseases classified elsewhere: Secondary | ICD-10-CM | POA: Diagnosis present

## 2015-07-15 DIAGNOSIS — E43 Unspecified severe protein-calorie malnutrition: Secondary | ICD-10-CM | POA: Diagnosis present

## 2015-07-15 DIAGNOSIS — J189 Pneumonia, unspecified organism: Secondary | ICD-10-CM | POA: Diagnosis present

## 2015-07-15 DIAGNOSIS — Y95 Nosocomial condition: Secondary | ICD-10-CM | POA: Diagnosis present

## 2015-07-15 DIAGNOSIS — J700 Acute pulmonary manifestations due to radiation: Secondary | ICD-10-CM

## 2015-07-15 DIAGNOSIS — M1990 Unspecified osteoarthritis, unspecified site: Secondary | ICD-10-CM | POA: Diagnosis present

## 2015-07-15 DIAGNOSIS — C3491 Malignant neoplasm of unspecified part of right bronchus or lung: Secondary | ICD-10-CM | POA: Diagnosis not present

## 2015-07-15 DIAGNOSIS — N401 Enlarged prostate with lower urinary tract symptoms: Secondary | ICD-10-CM | POA: Diagnosis present

## 2015-07-15 DIAGNOSIS — Z7952 Long term (current) use of systemic steroids: Secondary | ICD-10-CM | POA: Diagnosis not present

## 2015-07-15 DIAGNOSIS — Y842 Radiological procedure and radiotherapy as the cause of abnormal reaction of the patient, or of later complication, without mention of misadventure at the time of the procedure: Secondary | ICD-10-CM | POA: Diagnosis present

## 2015-07-15 DIAGNOSIS — Z8249 Family history of ischemic heart disease and other diseases of the circulatory system: Secondary | ICD-10-CM

## 2015-07-15 DIAGNOSIS — R600 Localized edema: Secondary | ICD-10-CM | POA: Diagnosis present

## 2015-07-15 DIAGNOSIS — Z8619 Personal history of other infectious and parasitic diseases: Secondary | ICD-10-CM | POA: Diagnosis not present

## 2015-07-15 DIAGNOSIS — R059 Cough, unspecified: Secondary | ICD-10-CM | POA: Insufficient documentation

## 2015-07-15 DIAGNOSIS — Z9981 Dependence on supplemental oxygen: Secondary | ICD-10-CM | POA: Diagnosis not present

## 2015-07-15 DIAGNOSIS — R338 Other retention of urine: Secondary | ICD-10-CM | POA: Diagnosis present

## 2015-07-15 DIAGNOSIS — R64 Cachexia: Secondary | ICD-10-CM | POA: Diagnosis present

## 2015-07-15 DIAGNOSIS — C3490 Malignant neoplasm of unspecified part of unspecified bronchus or lung: Secondary | ICD-10-CM | POA: Diagnosis present

## 2015-07-15 DIAGNOSIS — J45909 Unspecified asthma, uncomplicated: Secondary | ICD-10-CM | POA: Diagnosis present

## 2015-07-15 DIAGNOSIS — J9611 Chronic respiratory failure with hypoxia: Secondary | ICD-10-CM | POA: Diagnosis present

## 2015-07-15 DIAGNOSIS — I10 Essential (primary) hypertension: Secondary | ICD-10-CM | POA: Diagnosis present

## 2015-07-15 DIAGNOSIS — Z825 Family history of asthma and other chronic lower respiratory diseases: Secondary | ICD-10-CM | POA: Diagnosis not present

## 2015-07-15 DIAGNOSIS — Z681 Body mass index (BMI) 19 or less, adult: Secondary | ICD-10-CM | POA: Diagnosis not present

## 2015-07-15 DIAGNOSIS — R531 Weakness: Secondary | ICD-10-CM | POA: Diagnosis present

## 2015-07-15 DIAGNOSIS — Z720 Tobacco use: Secondary | ICD-10-CM

## 2015-07-15 DIAGNOSIS — R339 Retention of urine, unspecified: Secondary | ICD-10-CM | POA: Diagnosis present

## 2015-07-15 DIAGNOSIS — R05 Cough: Secondary | ICD-10-CM | POA: Insufficient documentation

## 2015-07-15 DIAGNOSIS — K5900 Constipation, unspecified: Secondary | ICD-10-CM | POA: Diagnosis present

## 2015-07-15 DIAGNOSIS — Z87891 Personal history of nicotine dependence: Secondary | ICD-10-CM

## 2015-07-15 DIAGNOSIS — C3411 Malignant neoplasm of upper lobe, right bronchus or lung: Secondary | ICD-10-CM | POA: Diagnosis present

## 2015-07-15 DIAGNOSIS — R627 Adult failure to thrive: Secondary | ICD-10-CM | POA: Diagnosis present

## 2015-07-15 HISTORY — DX: Essential (primary) hypertension: I10

## 2015-07-15 LAB — I-STAT CHEM 8, ED
BUN: 22 mg/dL — ABNORMAL HIGH (ref 6–20)
CALCIUM ION: 1.14 mmol/L (ref 1.13–1.30)
CHLORIDE: 96 mmol/L — AB (ref 101–111)
Creatinine, Ser: 1 mg/dL (ref 0.61–1.24)
GLUCOSE: 73 mg/dL (ref 65–99)
HCT: 38 % — ABNORMAL LOW (ref 39.0–52.0)
HEMOGLOBIN: 12.9 g/dL — AB (ref 13.0–17.0)
Potassium: 4 mmol/L (ref 3.5–5.1)
SODIUM: 136 mmol/L (ref 135–145)
TCO2: 27 mmol/L (ref 0–100)

## 2015-07-15 LAB — URINE CULTURE
COLONY COUNT: NO GROWTH
ORGANISM ID, BACTERIA: NO GROWTH

## 2015-07-15 LAB — IRON AND TIBC
Iron: 18 ug/dL — ABNORMAL LOW (ref 45–182)
SATURATION RATIOS: 9 % — AB (ref 17.9–39.5)
TIBC: 190 ug/dL — AB (ref 250–450)
UIBC: 172 ug/dL

## 2015-07-15 LAB — FERRITIN: Ferritin: 466 ng/mL — ABNORMAL HIGH (ref 24–336)

## 2015-07-15 LAB — CBC WITH DIFFERENTIAL/PLATELET
Basophils Absolute: 0 10*3/uL (ref 0.0–0.1)
Basophils Relative: 0 %
EOS ABS: 0 10*3/uL (ref 0.0–0.7)
EOS PCT: 0 %
HCT: 30.4 % — ABNORMAL LOW (ref 39.0–52.0)
HEMOGLOBIN: 9.9 g/dL — AB (ref 13.0–17.0)
LYMPHS ABS: 0.5 10*3/uL — AB (ref 0.7–4.0)
Lymphocytes Relative: 4 %
MCH: 28.7 pg (ref 26.0–34.0)
MCHC: 32.6 g/dL (ref 30.0–36.0)
MCV: 88.1 fL (ref 78.0–100.0)
MONOS PCT: 3 %
Monocytes Absolute: 0.5 10*3/uL (ref 0.1–1.0)
Neutro Abs: 12.7 10*3/uL — ABNORMAL HIGH (ref 1.7–7.7)
Neutrophils Relative %: 93 %
PLATELETS: 269 10*3/uL (ref 150–400)
RBC: 3.45 MIL/uL — ABNORMAL LOW (ref 4.22–5.81)
RDW: 18.9 % — ABNORMAL HIGH (ref 11.5–15.5)
WBC: 13.7 10*3/uL — AB (ref 4.0–10.5)

## 2015-07-15 LAB — FOLATE: FOLATE: 13.8 ng/mL (ref >5.9)

## 2015-07-15 LAB — I-STAT TROPONIN, ED: TROPONIN I, POC: 0.01 ng/mL (ref 0.00–0.08)

## 2015-07-15 LAB — TSH: TSH: 1.738 u[IU]/mL (ref 0.350–4.500)

## 2015-07-15 LAB — PREALBUMIN: Prealbumin: 17 mg/dL — ABNORMAL LOW (ref 18–38)

## 2015-07-15 LAB — VITAMIN B12: VITAMIN B 12: 708 pg/mL (ref 180–914)

## 2015-07-15 MED ORDER — IPRATROPIUM BROMIDE 0.02 % IN SOLN: 0.5000 mg | RESPIRATORY_TRACT | Status: DC | PRN

## 2015-07-15 MED ORDER — GUAIFENESIN ER 600 MG PO TB12
1200.0000 mg | ORAL_TABLET | Freq: Two times a day (BID) | ORAL | Status: DC
  Administered 2015-07-15 – 2015-07-16 (×2): 1200 mg via ORAL
  Filled 2015-07-15 (×2): qty 2

## 2015-07-15 MED ORDER — DEXTROSE 5 % IV SOLN: 2.0000 g | Freq: Three times a day (TID) | INTRAVENOUS | Status: DC

## 2015-07-15 MED ORDER — PREDNISONE 20 MG PO TABS
40.0000 mg | ORAL_TABLET | Freq: Every day | ORAL | Status: AC
  Administered 2015-07-16 – 2015-07-18 (×3): 40 mg via ORAL
  Filled 2015-07-15 (×3): qty 2

## 2015-07-15 MED ORDER — VANCOMYCIN HCL IN DEXTROSE 1-5 GM/200ML-% IV SOLN
1000.0000 mg | Freq: Once | INTRAVENOUS | Status: AC
  Administered 2015-07-15: 1000 mg via INTRAVENOUS
  Filled 2015-07-15: qty 200

## 2015-07-15 MED ORDER — SODIUM CHLORIDE 0.9 % IV SOLN
1000.0000 mL | Freq: Once | INTRAVENOUS | Status: AC
  Administered 2015-07-15: 1000 mL via INTRAVENOUS

## 2015-07-15 MED ORDER — TIOTROPIUM BROMIDE MONOHYDRATE 2.5 MCG/ACT IN AERS: 2.0000 | INHALATION_SPRAY | Freq: Every day | RESPIRATORY_TRACT | Status: DC

## 2015-07-15 MED ORDER — VANCOMYCIN HCL IN DEXTROSE 1-5 GM/200ML-% IV SOLN
1000.0000 mg | INTRAVENOUS | Status: DC
  Administered 2015-07-16 – 2015-07-17 (×2): 1000 mg via INTRAVENOUS
  Filled 2015-07-15 (×3): qty 200

## 2015-07-15 MED ORDER — LEVALBUTEROL HCL 0.63 MG/3ML IN NEBU: 0.6300 mg | INHALATION_SOLUTION | RESPIRATORY_TRACT | Status: DC | PRN

## 2015-07-15 MED ORDER — FLUTICASONE PROPIONATE 50 MCG/ACT NA SUSP
2.0000 | Freq: Every day | NASAL | Status: DC
  Administered 2015-07-15 – 2015-07-19 (×4): 2 via NASAL
  Filled 2015-07-15: qty 16

## 2015-07-15 MED ORDER — ENSURE ENLIVE PO LIQD
237.0000 mL | Freq: Three times a day (TID) | ORAL | Status: DC
  Administered 2015-07-15 – 2015-07-19 (×7): 237 mL via ORAL

## 2015-07-15 MED ORDER — MOMETASONE FURO-FORMOTEROL FUM 200-5 MCG/ACT IN AERO
2.0000 | INHALATION_SPRAY | Freq: Two times a day (BID) | RESPIRATORY_TRACT | Status: DC
  Administered 2015-07-15 – 2015-07-16 (×2): 2 via RESPIRATORY_TRACT
  Filled 2015-07-15: qty 8.8

## 2015-07-15 MED ORDER — ZOLPIDEM TARTRATE 5 MG PO TABS
5.0000 mg | ORAL_TABLET | Freq: Every evening | ORAL | Status: DC | PRN
  Administered 2015-07-17 – 2015-07-18 (×2): 5 mg via ORAL
  Filled 2015-07-15 (×2): qty 1

## 2015-07-15 MED ORDER — PANTOPRAZOLE SODIUM 40 MG PO TBEC
40.0000 mg | DELAYED_RELEASE_TABLET | Freq: Every day | ORAL | Status: DC
  Administered 2015-07-15 – 2015-07-19 (×4): 40 mg via ORAL
  Filled 2015-07-15 (×5): qty 1

## 2015-07-15 MED ORDER — IPRATROPIUM BROMIDE 0.02 % IN SOLN: 0.5000 mg | Freq: Four times a day (QID) | RESPIRATORY_TRACT | Status: DC

## 2015-07-15 MED ORDER — CEFTAZIDIME 2 G IJ SOLR
2.0000 g | Freq: Two times a day (BID) | INTRAMUSCULAR | Status: DC
  Administered 2015-07-16 – 2015-07-19 (×7): 2 g via INTRAVENOUS
  Filled 2015-07-15 (×8): qty 2

## 2015-07-15 MED ORDER — VANCOMYCIN HCL IN DEXTROSE 1-5 GM/200ML-% IV SOLN: 1000.0000 mg | INTRAVENOUS | Status: DC

## 2015-07-15 MED ORDER — TAMSULOSIN HCL 0.4 MG PO CAPS
0.4000 mg | ORAL_CAPSULE | Freq: Every day | ORAL | Status: DC
  Administered 2015-07-15 – 2015-07-18 (×4): 0.4 mg via ORAL
  Filled 2015-07-15 (×5): qty 1

## 2015-07-15 MED ORDER — CEFTAZIDIME 2 G IJ SOLR
2.0000 g | Freq: Three times a day (TID) | INTRAMUSCULAR | Status: DC
  Administered 2015-07-15: 2 g via INTRAVENOUS
  Filled 2015-07-15: qty 2

## 2015-07-15 MED ORDER — ENSURE HIGH PROTEIN PO LIQD: 1.0000 | Freq: Three times a day (TID) | ORAL | Status: DC

## 2015-07-15 MED ORDER — LEVALBUTEROL HCL 0.63 MG/3ML IN NEBU
0.6300 mg | INHALATION_SOLUTION | Freq: Three times a day (TID) | RESPIRATORY_TRACT | Status: DC
  Administered 2015-07-16 – 2015-07-19 (×11): 0.63 mg via RESPIRATORY_TRACT
  Filled 2015-07-15 (×11): qty 3

## 2015-07-15 MED ORDER — SODIUM CHLORIDE 0.9 % IV SOLN
INTRAVENOUS | Status: DC
  Administered 2015-07-15 – 2015-07-17 (×4): via INTRAVENOUS

## 2015-07-15 MED ORDER — TIOTROPIUM BROMIDE MONOHYDRATE 18 MCG IN CAPS
18.0000 ug | ORAL_CAPSULE | Freq: Every day | RESPIRATORY_TRACT | Status: DC
  Administered 2015-07-15 – 2015-07-16 (×2): 18 ug via RESPIRATORY_TRACT
  Filled 2015-07-15: qty 5

## 2015-07-15 MED ORDER — LEVALBUTEROL HCL 0.63 MG/3ML IN NEBU
0.6300 mg | INHALATION_SOLUTION | Freq: Four times a day (QID) | RESPIRATORY_TRACT | Status: DC
  Administered 2015-07-15: 0.63 mg via RESPIRATORY_TRACT
  Filled 2015-07-15: qty 3

## 2015-07-15 MED ORDER — ENOXAPARIN SODIUM 30 MG/0.3ML ~~LOC~~ SOLN
30.0000 mg | SUBCUTANEOUS | Status: DC
  Administered 2015-07-16: 30 mg via SUBCUTANEOUS
  Filled 2015-07-15 (×5): qty 0.3

## 2015-07-15 MED ORDER — ACETAMINOPHEN 325 MG PO TABS: 650.0000 mg | ORAL_TABLET | ORAL | Status: DC | PRN

## 2015-07-15 MED ORDER — SODIUM CHLORIDE 0.9 % IV SOLN: 1000.0000 mL | INTRAVENOUS | Status: DC

## 2015-07-15 MED ORDER — POLYETHYLENE GLYCOL 3350 17 G PO PACK
17.0000 g | PACK | Freq: Every day | ORAL | Status: DC
  Administered 2015-07-15 – 2015-07-19 (×5): 17 g via ORAL
  Filled 2015-07-15 (×6): qty 1

## 2015-07-15 MED ORDER — ONDANSETRON HCL 4 MG/2ML IJ SOLN: 4.0000 mg | Freq: Four times a day (QID) | INTRAMUSCULAR | Status: DC | PRN

## 2015-07-15 NOTE — ED Notes (Signed)
Per pt, states he was diagnosed with PNA yesterday-increased weakness, lethargy

## 2015-07-15 NOTE — Progress Notes (Signed)
Subjective:    Patient ID: Frank Garcia, male    DOB: 1929-04-07, 81 y.o.   MRN: [id number]  HPI 81 yo male former smoker with Stage IIA Lung Adenocarcinoma and severe COPD   TEST /Significant Events:  CT scan of the chest on 02/04/2015 there was increase in the size of 0.8 x 0.9 x 1.5 cm nodule in the right lower lobe  PET scan was performed on 02/15/2015 and it showed hypermetabolic right upper lobe pulmonary nodule suggesting primary lung neoplasm.  03/10/2015 the patient underwent bronchoscopy with electromagnetic navigational bronchoscopy  Path +non small cell carcinoma favoring adenocarcinoma  04/30/15 XRT started >finished 06/17/15  04/27/15 Chemo started (carboplatin and pacitaxel)> last infusion ~06/02/15   06/29/15 >EOL /Code status discussion with pt and daughter >wants full code status.    07/15/2015 Acute OV  COPD and Lung Cancer and PNA  Pt returns accompanied by his daughter with persistent symptoms of cough , congestion and dyspnea.  Admitted last month for COPD flare and HCAP  He was treated with IV antibiotics, steroids, and nebulized bronchodilators. .   He was seen 2 weeks ago for follow up with ongoing weakness, FTT and anorexia.  He was started on Megace , which has helped with appetite . He is now eating and has gained 4lbs.  But says he got better for few days with some increased energy but started going back down hill last week.  He was called in Augmentin by on call MD. Does not feel it has helped. Labs showed elevated wbc ~18k.  Denies chest pain, orthopnea, edema or fever, hemoptysis  CT chest done on 11/3 showed resolved RUL nodule, increased consolidation RUL ? Radiation effect., improved lingular pna. New 7 mm LLL nodule.  Says she is so tired , just wants to sleep all the time.     Past Medical History  Diagnosis Date  . BPH (benign prostatic hypertrophy)     takes Rapaflo daily  . Chronic airway obstruction, not elsewhere classified   . Asthma    uses Spiriva and Dulera daily  . Emphysema lung ([place])   . Chronic bronchitis ([place])   . Shortness of breath     Albuterol inhaler as needed;lying and exertion  . Pneumonia     "a few times" (08/26/2014)  . Cough     productive but without fever  . Arthritis   . Joint pain   . Cataracts, bilateral     immature  . History of shingles   . Cancer ([place])   . Hypertension     Current Outpatient Prescriptions on File Prior to Visit  Medication Sig Dispense Refill  . albuterol (PROVENTIL HFA;VENTOLIN HFA) 108 (90 BASE) MCG/ACT inhaler Inhale 2 puffs into the lungs every 4 (four) hours as needed for wheezing or shortness of breath. 1 Inhaler 3  . albuterol (PROVENTIL) (2.5 MG/3ML) 0.083% nebulizer solution USE 1 VIAL PER NEBULIZER EVERY 2 HOURS AS NEEDED FOR WHEEZING AND SHORTNESS OF BREATH 600 mL 0  . amoxicillin-clavulanate (AUGMENTIN) 875-125 MG tablet Take 1 tablet by mouth 2 (two) times daily. 20 tablet 0  . fluticasone (FLONASE) 50 MCG/ACT nasal spray Place 2 sprays into both nostrils daily. (Patient taking differently: Place 2 sprays into both nostrils daily as needed for allergies. ) 16 g 5  . megestrol (MEGACE) 40 MG/ML suspension Take 5 mLs (200 mg total) by mouth 2 (two) times daily. 240 mL 0  . mometasone-formoterol (DULERA) 200-5 MCG/ACT AERO Inhale 2 puffs  into the lungs 2 (two) times daily. 2 Inhaler 0  . Nutritional Supplements (ENSURE HIGH PROTEIN) LIQD Take 1 Bottle by mouth 3 (three) times daily. To be taken 1 bottle three times a day between meals; not as meal substitution.. 29562 mL 3  . pantoprazole (PROTONIX) 40 MG tablet Take 1 tablet (40 mg total) by mouth daily at 12 noon. 60 tablet 3  . polyethylene glycol (MIRALAX / GLYCOLAX) packet Take 17 g by mouth daily. Hold for diarrhea (Patient taking differently: Take 17 g by mouth daily as needed for mild constipation or moderate constipation. Hold for diarrhea) 28 each 0  . predniSONE (DELTASONE) 10 MG tablet Take '40mg'$  for 3  days, then '30mg'$  for 3 days, '20mg'$  for 3 days, then continue at '10mg'$  daily 27 tablet 0  . RAPAFLO 8 MG CAPS capsule Take 1 capsule by mouth daily.    . Tiotropium Bromide Monohydrate (SPIRIVA RESPIMAT) 2.5 MCG/ACT AERS Inhale 2 puffs into the lungs daily. 3 Inhaler 1  . fluconazole (DIFLUCAN) 40 MG/ML suspension Take 200 mg by mouth daily.    Marland Kitchen guaiFENesin (MUCINEX) 600 MG 12 hr tablet Take 1 tablet (600 mg total) by mouth 2 (two) times daily. (Patient not taking: Reported on 07/14/2015) 30 tablet 0  . HYDROcodone-acetaminophen (HYCET) 7.5-325 mg/15 ml solution Take 10 mLs by mouth 4 (four) times daily as needed for moderate pain. (Patient not taking: Reported on 07/14/2015) 473 mL 0  . HYDROcodone-homatropine (HYDROMET) 5-1.5 MG/5ML syrup Take 2.5 mLs by mouth at bedtime as needed. (Patient not taking: Reported on 07/14/2015) 240 mL 0  . Magnesium Hydroxide (MILK OF MAGNESIA PO) Take 1 Dose by mouth daily as needed (constipation).    . predniSONE (DELTASONE) 10 MG tablet 4 tabs for 2 days, then 3 tabs for 2 days, 2 tabs for 2 days, then 1 tab daily (Patient not taking: Reported on 07/14/2015) 45 tablet 1  . predniSONE (DELTASONE) 10 MG tablet Take 4 tablets by mouth x 2 days, then 2 tablets by mouth x 2 days, then 1 tablet by mouth x 2 days,then d/c (Patient not taking: Reported on 07/14/2015) 15 tablet 0  . prochlorperazine (COMPAZINE) 10 MG tablet Take 1 tablet (10 mg total) by mouth every 6 (six) hours as needed for nausea or vomiting. (Patient not taking: Reported on 06/29/2015) 30 tablet 0  . sucralfate (CARAFATE) 1 G tablet Take 1 tab and dissolve in 6 oz water tid prn for swallowing. (Patient not taking: Reported on 07/14/2015) 60 tablet 1  . zolpidem (AMBIEN) 5 MG tablet Take 1 tablet (5 mg total) by mouth at bedtime as needed for sleep. (Patient not taking: Reported on 07/14/2015) 30 tablet 0   No current facility-administered medications on file prior to visit.    Review of Systems    Constitutional: Negative for fever,  HENT: Negative for congestion, dental problem, ear pain, nosebleeds, postnasal drip, rhinorrhea, sinus pressure, sneezing, sore throat and trouble swallowing.   Eyes: Negative for redness and itching.  Respiratory: Positive for shortness of breath.  Cardiovascular: Negative for palpitations and leg swelling.  Gastrointestinal: Negative for nausea and vomiting.  Genitourinary: Negative for dysuria.  Musculoskeletal: Negative for joint swelling.  Skin: Negative for rash.  Neurological: Negative for headaches.  Hematological: Does not bruise/bleed easily.  Psychiatric/Behavioral: Negative for dysphoric mood. The patient is not nervous/anxious.        Objective:   Physical Exam VS reviewed  GEN: A/Ox3; pleasant , NAD, thin and frail on O2  HEENT:  [place]/AT,  EACs-clear, TMs-wnl, NOSE-clear, THROAT-clear, no lesions, no postnasal drip or exudate noted. Very poor dentition   NECK:  Supple w/ fair ROM; no JVD; normal carotid impulses w/o bruits; no thyromegaly or nodules palpated; no lymphadenopathy.  RESP  Decreased BS in bases .no accessory muscle use, no dullness to percussion  CARD:  RRR, no m/r/g  , no peripheral edema, pulses intact, no cyanosis or clubbing.  GI:   Soft & nt; nml bowel sounds; no organomegaly or masses detected.  Musco: Warm bil, no deformities or joint swelling noted.   Neuro: alert, no focal deficits noted.    Skin: Warm, no lesions or rashes    CT Chest  07/01/15 reviewed independently No evidence of acute pneumonia. 2. Increased consolidation superimposed on centrilobular emphysema in the RIGHT upper lobe suggests radiation effect. 3. Positive treatment response of RIGHT upper lobe nodule which is no longer measurable. 4. Improvement in the lingular pneumonia seen on comparison CT. 5. Extensive centrilobular emphysema. 6. New 7 mm pulmonary nodule in the superior segment of the LEFT lower lobe likely represents  infection or inflammation. Recommend close attention on follow-up. ' Assessment & Plan:

## 2015-07-15 NOTE — Assessment & Plan Note (Signed)
Most recent CT chest w/ resolved RUL nodule , radiation changes noted on CT .  New LLL nodule will need serial follow up  Keep follow up with oncology as planned this month

## 2015-07-15 NOTE — H&P (Signed)
[facility] History and Physical  KASCH BORQUEZ BSJ:[id number] DOB: 1929-06-24 DOA: 07/15/2015  Referring physician: Dr. Maryan Rued PCP: Brand Males, MD   Chief Complaint: Fatigue/weakness/recently diagnosed pneumonia per patient  HPI: Frank Garcia is a 81 y.o. 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 Patient was seen at his pulmonary doctors office 1 day prior to admission when he had complaints of generalized fatigue and feeling very weak, tiredness all the time, cough productive of yellowish sputum. Patient stated chest x-ray was done and he was called and diagnosed with a pneumonia and started on a course of Levaquin and a steroid taper. Patient denies any change in his chronic shortness of breath, no fever, no chills, no nausea, no vomiting, no abdominal pain, no palpitations, no diarrhea, no melena, no hematemesis, no hematochezia, no change in intermittent chest pain. Patient does endorse constipation. Patient presented to the ED due to no significant improvement in his symptoms of ongoing weakness. Chest x-ray was not repeated. CBC obtained had a white count of 13.7 hemoglobin of 9.9 otherwise within normal limits. I-STAT 8 was obtained with a BUN of 22 chloride of 96 otherwise was within normal limits. Point-of-care troponin was negative. EKG showed sinus tachycardia with left axis deviation. Patient was given IV vancomycin and IV Fortaz. [facility] were called to admit the patient for further evaluation and management.   Review of Systems: As history of present illness otherwise negative. Constitutional:  No weight loss, night sweats, Fevers, chills,  fatigue.  HEENT:  No headaches, Difficulty swallowing,Tooth/dental problems,Sore throat,  No sneezing, itching, ear ache, nasal congestion, post nasal drip,  Cardio-vascular:  No chest pain, Orthopnea, PND, swelling in lower extremities, anasarca, dizziness, palpitations  GI:  No heartburn, indigestion, abdominal pain, nausea, vomiting, diarrhea, change in bowel habits, loss of appetite  Resp:  No shortness of breath with exertion or at rest. No excess mucus, no productive cough, No non-productive cough, No coughing up of blood.No change in color of mucus.No wheezing.No chest wall deformity  Skin:  no rash or lesions.  GU:  no dysuria, change in color of urine, no urgency or frequency. No flank pain.  Musculoskeletal:  No joint pain or swelling. No decreased range of motion. No back pain.  Psych:  No change in mood or affect. No depression or anxiety. No memory loss.   Past Medical History  Diagnosis Date  . BPH (benign prostatic hypertrophy)     takes Rapaflo daily  . Chronic airway obstruction, not elsewhere classified   . Asthma     uses Spiriva and Dulera daily  . Emphysema lung ([place])   . Chronic bronchitis ([place])   . Shortness of breath     Albuterol inhaler as needed;lying and exertion  . Pneumonia     "a few times" (08/26/2014)  . Cough     productive but without fever  . Arthritis   . Joint pain   . Cataracts, bilateral     immature  . History of shingles   . Cancer ([place])   . Hypertension 07/15/2015    Pt denies hx of HTN   Past Surgical History  Procedure Laterality Date  . No past surgeries    .  Video bronchoscopy with endobronchial navigation N/A 03/10/2015    Procedure: VIDEO BRONCHOSCOPY WITH ENDOBRONCHIAL NAVIGATION and PLACEMENT OF 4MM X 7MM FIDUCIAL MARKERS x3;  Surgeon: Collene Gobble, MD;  Location: [facility];  Service: Thoracic;  Laterality: N/A;   Social History:  reports that he quit smoking about 7 years ago. His smoking use included Cigarettes. He has  a 67.5 pack-year smoking history. He quit smokeless tobacco use about 7 years ago. He reports that he does not drink alcohol or use illicit drugs.  No Known Allergies  Family History  Problem Relation Age of Onset  . COPD Father   . Heart failure Mother    mother deceased age 9 from CHF. Father deceased age 24 from COPD.  Prior to Admission medications   Medication Sig Start Date End Date Taking? Authorizing Provider  albuterol (PROVENTIL HFA;VENTOLIN HFA) 108 (90 BASE) MCG/ACT inhaler Inhale 2 puffs into the lungs every 4 (four) hours as needed for wheezing or shortness of breath. 01/27/14  Yes Brand Males, MD  albuterol (PROVENTIL) (2.5 MG/3ML) 0.083% nebulizer solution USE 1 VIAL PER NEBULIZER EVERY 2 HOURS AS NEEDED FOR WHEEZING AND SHORTNESS OF BREATH 07/06/15  Yes Brand Males, MD  amoxicillin-clavulanate (AUGMENTIN) 875-125 MG tablet Take 1 tablet by mouth 2 (two) times daily. 07/06/15  Yes Tanda Rockers, MD  fluticasone (FLONASE) 50 MCG/ACT nasal spray Place 2 sprays into both nostrils daily. Patient taking differently: Place 2 sprays into both nostrils daily as needed for allergies.  01/11/15  Yes Brand Males, MD  guaiFENesin (MUCINEX) 600 MG 12 hr tablet Take 1 tablet (600 mg total) by mouth 2 (two) times daily. Patient taking differently: Take 600 mg by mouth 2 (two) times daily as needed for cough or to loosen phlegm.  04/12/15  Yes Barton Dubois, MD  levofloxacin (LEVAQUIN) 500 MG tablet Take 1 tablet (500 mg total) by mouth daily. 07/14/15  Yes Tammy S Parrett, NP  megestrol (MEGACE) 40 MG/ML suspension Take 5 mLs (200 mg total) by mouth 2 (two) times daily. 06/29/15  Yes Tammy S Parrett, NP  mometasone-formoterol (DULERA) 200-5 MCG/ACT AERO Inhale 2 puffs into the lungs 2 (two) times daily. 03/15/15  Yes Brand Males, MD  Nutritional Supplements (ENSURE HIGH PROTEIN) LIQD Take 1 Bottle by mouth 3 (three) times daily. To be taken 1 bottle three times a day between  meals; not as meal substitution.. 04/12/15  Yes Barton Dubois, MD  pantoprazole (PROTONIX) 40 MG tablet Take 1 tablet (40 mg total) by mouth daily at 12 noon. 06/09/15  Yes Erick Colace, NP  predniSONE (DELTASONE) 10 MG tablet Take '40mg'$  for 3 days, then '30mg'$  for 3 days, '20mg'$  for 3 days, then continue at '10mg'$  daily Patient taking differently: Take 20 mg by mouth daily with breakfast.  07/12/15  Yes Brand Males, MD  RAPAFLO 8 MG CAPS capsule Take 1 capsule by mouth daily. 03/04/14  Yes Historical Provider, MD  Tiotropium Bromide Monohydrate (SPIRIVA RESPIMAT) 2.5 MCG/ACT AERS Inhale 2 puffs into the lungs daily. 04/09/15  Yes Brand Males, MD  zolpidem (AMBIEN) 5 MG tablet Take 1 tablet (5 mg total) by mouth at bedtime as needed for sleep. 06/09/15  Yes Erick Colace, NP  HYDROcodone-acetaminophen (HYCET) 7.5-325 mg/15 ml solution Take 10 mLs by mouth 4 (four) times daily as needed for moderate pain. Patient not taking: Reported on 07/14/2015 05/26/15   Thea Silversmith, MD  HYDROcodone-homatropine (HYDROMET) 5-1.5 MG/5ML syrup Take 2.5 mLs by mouth at bedtime as  needed. Patient not taking: Reported on 07/14/2015 03/12/15   Collene Gobble, MD  Magnesium Hydroxide (MILK OF MAGNESIA PO) Take 1 Dose by mouth daily as needed (constipation).    Historical Provider, MD  polyethylene glycol (MIRALAX / GLYCOLAX) packet Take 17 g by mouth daily. Hold for diarrhea Patient taking differently: Take 17 g by mouth daily as needed for mild constipation or moderate constipation. Hold for diarrhea 04/12/15   Barton Dubois, MD  predniSONE (DELTASONE) 10 MG tablet 4 tabs for 2 days, then 3 tabs for 2 days, 2 tabs for 2 days, then 1 tab daily Patient not taking: Reported on 07/14/2015 07/01/15   Tammy S Parrett, NP  predniSONE (DELTASONE) 10 MG tablet Take 4 tablets by mouth x 2 days, then 2 tablets by mouth x 2 days, then 1 tablet by mouth x 2 days,then d/c Patient not taking: Reported on 07/14/2015 07/06/15    Tanda Rockers, MD  prochlorperazine (COMPAZINE) 10 MG tablet Take 1 tablet (10 mg total) by mouth every 6 (six) hours as needed for nausea or vomiting. Patient not taking: Reported on 07/15/2015 04/26/15   Curt Bears, MD  sucralfate (CARAFATE) 1 G tablet Take 1 tab and dissolve in 6 oz water tid prn for swallowing. Patient not taking: Reported on 07/14/2015 05/26/15   Thea Silversmith, MD   Physical Exam: Filed Vitals:   07/15/15 1415 07/15/15 1416 07/15/15 1617 07/15/15 1840  BP:  106/79 110/67   Pulse:  114 101 98  Temp:  98 F (36.7 C)  98.3 F (36.8 C)  TempSrc:  Oral  Oral  Resp:  '16 23 18  '$ Weight:  41.731 kg (92 lb)    SpO2: 3% 97% 100% 100%    Wt Readings from Last 3 Encounters:  07/15/15 41.731 kg (92 lb)  07/14/15 41.731 kg (92 lb)  06/29/15 39.917 kg (88 lb)    General:  Cachectic frail elderly gentleman in no acute cardiopulmonary distress. Patient speaking in full sentences.  Eyes: PERRLA, EOMI, normal lids, irises & conjunctiva ENT: grossly normal hearing, lips & tongue Neck: no LAD, masses or thyromegaly Cardiovascular: Tachycardic, no m/r/g. Bilateral pedal edema. Telemetry: Sinus tachycardia Respiratory: CTA bilaterally, no w/r/r. Normal respiratory effort. Abdomen: soft, ntnd, positive bowel sounds, no rebound, no guarding. Skin: no rash or induration seen on limited exam Musculoskeletal: grossly normal tone 4/5 BUE/BLE Psychiatric: grossly normal mood and affect, speech fluent and appropriate Neurologic: Alert and oriented 3. Cranial nerves II through XII grossly intact. No focal deficits.           Labs on Admission:  Basic Metabolic Panel:  Recent Labs Lab 07/13/15 0804 07/15/15 1626  NA 141 136  K 4.3 4.0  CL 100 96*  CO2 30  --   GLUCOSE 100* 73  BUN 23 22*  CREATININE 0.84 1.00  CALCIUM 9.6  --   MG 2.2  --   PHOS 3.1  --    Liver Function Tests:  Recent Labs Lab 07/13/15 0804  AST 16  ALT 16  ALKPHOS 71  BILITOT 1.1  PROT  7.4  ALBUMIN 3.3*   No results for input(s): LIPASE, AMYLASE in the last 168 hours. No results for input(s): AMMONIA in the last 168 hours. CBC:  Recent Labs Lab 07/13/15 0804 07/15/15 1617 07/15/15 1626  WBC 18.0* 13.7*  --   NEUTROABS 16.9* 12.7*  --   HGB 11.6* 9.9* 12.9*  HCT 36.6* 30.4* 38.0*  MCV 89.2 88.1  --  PLT 367.0 Result may be falsely decreased due to platelet clumping. 269  --    Cardiac Enzymes: No results for input(s): CKTOTAL, CKMB, CKMBINDEX, TROPONINI in the last 168 hours.  BNP (last 3 results)  Recent Labs  08/26/14 1527 04/11/15 0011 06/07/15 1755  BNP 46.7 29.7 56.7    ProBNP (last 3 results) No results for input(s): PROBNP in the last 8760 hours.  CBG: No results for input(s): GLUCAP in the last 168 hours.  Radiological Exams on Admission: Dg Chest 2 View  07/14/2015  CLINICAL DATA:  Follow-up right lung infiltrate with fatigue, cough, shortness of breath. EXAM: CHEST  2 VIEW COMPARISON:  June 29, 2015 FINDINGS: The heart size and mediastinal contours are within normal limits. There is patchy consolidation of the right upper lobe slightly worse compared to prior exam. The lungs are hyperinflated. There is no pleural effusion. The visualized skeletal structures are unremarkable. IMPRESSION: Right upper lobe consolidation/ pneumonia slightly worse compared to prior exam. Emphysema. Electronically Signed   By: Abelardo Diesel M.D.   On: 07/14/2015 15:28    EKG: Independently reviewed. Sinus tachycardia with left axis deviation.  Assessment/Plan Principal Problem:   HCAP (healthcare-associated pneumonia) Active Problems:   COPD, very severe ([place])   TOBACCO ABUSE-HISTORY OF   Urinary retention   Chronic respiratory failure with hypoxia (HCC)   Protein-calorie malnutrition, severe (HCC)   Adenocarcinoma of lung, stage 2 (HCC)   BPH (benign prostatic hyperplasia)   Malignant neoplasm of upper lobe of right lung (HCC)   FTT (failure to  thrive) in adult   Pedal edema  #1 healthcare associated pneumonia Patient presenting with generalized weakness fatigue productive cough of yellowish sputum. Patient states no change in his chronic shortness of breath. Patient was seen at his pulmonologist office chest x-ray done on 07/14/2015 was concerning for right upper lobe pneumonia. Patient was advised to present to the ED however did not 1, one day prior to admission. Patient was prescribed some Levaquin and he took 2 doses. Will admit the patient to telemetry. Repeat chest x-ray. Check a comprehensive metabolic profile. Check a magnesium level. Check a phosphorus level. Check a urine Legionella antigen. Check a urine pneumococcus antigen. Check blood cultures 2. Place on oxygen, scheduled nebs, Mucinex, empiric IV vancomycin and IV Fortaz. Continue steroid taper that was started at pulmonologist office 1 day prior to admission. Follow.  #2 history of severe COPD/chronic respiratory failure on home O2  3 L nasal cannula. Stable. No wheezes noted. Continue the lateral. Nebulizers.  #3 BPH/urinary retention Flomax.  #4 failure to thrive in adult Likely secondary to stage II a adenocarcinoma of the lung. Patient also noted to be anemic. Check an anemia panel. Follow H&H. Nutritional supplementation. PT/OT. See problem #1. Right upper   #5 pedal edema May be secondary to hypoalbuminemia. Check and a prealbumin levels. Check a chest x-ray. Continue nutritional supplementation. Follow.  #6 stage II adenocarcinoma of the lung Will inform oncology via apical patient's admission.  #7 severe protein calorie malnutrition Nutritional supplementation. Follow.  #8 prophylaxis Lovenox for DVT prophylaxis.   Code Status: Full DVT Prophylaxis: Lovenox Family Communication: updated patient and family at bedside. Disposition Plan: Admit to telemetry.  Time spent: 57 mins  [facility] Pager [phone]

## 2015-07-15 NOTE — Assessment & Plan Note (Signed)
Cont on O2 .  

## 2015-07-15 NOTE — Progress Notes (Signed)
ANTIBIOTIC CONSULT NOTE - INITIAL  Pharmacy Consult for Vancomycin Indication: pneumonia  No Known Allergies  Patient Measurements: Weight: 92 lb (41.731 kg)  Vital Signs: Temp: 98 F (36.7 C) (11/17 1416) Temp Source: Oral (11/17 1416) BP: 106/79 mmHg (11/17 1416) Pulse Rate: 114 (11/17 1416) Intake/Output from previous day:   Intake/Output from this shift:    Labs:  Recent Labs  07/13/15 0804  WBC 18.0*  HGB 11.6*  PLT 367.0 Result may be falsely decreased due to platelet clumping.  CREATININE 0.84   Estimated Creatinine Clearance: 38.6 mL/min (by C-G formula based on Cr of 0.84). No results for input(s): VANCOTROUGH, VANCOPEAK, VANCORANDOM, GENTTROUGH, GENTPEAK, GENTRANDOM, TOBRATROUGH, TOBRAPEAK, TOBRARND, AMIKACINPEAK, AMIKACINTROU, AMIKACIN in the last 72 hours.   Microbiology: Recent Results (from the past 720 hour(s))  Blood culture (routine x 2)     Status: None   Collection Time: 06/18/15  8:13 PM  Result Value Ref Range Status   Specimen Description BLOOD LEFT FOREARM  Final   Special Requests BOTTLES DRAWN AEROBIC AND ANAEROBIC 5ML  Final   Culture NO GROWTH 5 DAYS  Final   Report Status 06/23/2015 FINAL  Final  Blood culture (routine x 2)     Status: None   Collection Time: 06/18/15  9:07 PM  Result Value Ref Range Status   Specimen Description BLOOD LEFT HAND  Final   Special Requests BOTTLES DRAWN AEROBIC AND ANAEROBIC 5ML  Final   Culture NO GROWTH 5 DAYS  Final   Report Status 06/23/2015 FINAL  Final   Medical History: Past Medical History  Diagnosis Date  . BPH (benign prostatic hypertrophy)     takes Rapaflo daily  . Chronic airway obstruction, not elsewhere classified   . Asthma     uses Spiriva and Dulera daily  . Emphysema lung ([place])   . Chronic bronchitis ([place])   . Shortness of breath     Albuterol inhaler as needed;lying and exertion  . Pneumonia     "a few times" (08/26/2014)  . Cough     productive but without fever  .  Arthritis   . Joint pain   . Cataracts, bilateral     immature  . History of shingles   . Cancer ([place])   . Hypertension    Medications:  Scheduled:   Anti-infectives    Start     Dose/Rate Route Frequency Ordered Stop   07/15/15 1545  cefTAZidime (FORTAZ) 2 g in dextrose 5 % 50 mL IVPB     2 g 100 mL/hr over 30 Minutes Intravenous 3 times per day 07/15/15 1541       Assessment: 63 yoM with RUL consolidation. Previous admit 10/21 for PNA treated with Vancomycin/Zosyn. On Augmentin as outpatient from 11/8- 17, and Levaquin 11/16 pallned to 11/25.  Admit for treatment of PNA with Vancomycin and Ceftazidime  Goal of Therapy:  Vancomycin trough level 15-20 mcg/ml  Plan:   Vancomycin 1gm IV q24 hr x 8 days  Ceftazidime 2gm ordered q8hr, schedule adjusted to q12 hr for renal function.  Minda Ditto PharmD Pager [phone] 07/15/2015, 5:17 PM

## 2015-07-15 NOTE — Progress Notes (Signed)
Patient noncompliant with CPT and complains of being "a woke up every time I close my eyes". He does not wish to be bothered in the night by RT. Assessment protocol completed. Orders changed accordingly. He does agree to the use of a flutter valve. Eduction provided. Flutter given. Teach back verified. RT will continue to follow.

## 2015-07-15 NOTE — ED Provider Notes (Signed)
CSN: [id number]     Arrival date & time 07/15/15  1409 History   First MD Initiated Contact with Patient 07/15/15 1507     Chief Complaint  Patient presents with  . PNA      (Consider location/radiation/quality/duration/timing/severity/associated sxs/prior Treatment) HPI Comments: Patient is an 81 year old male with a history of COPD, lung cancer currently completing a course of chemotherapy and radiation, recurrent healthcare associated pneumonia and hypertension who presents today with his family for worsening weakness, poor by mouth intake, minimal productive cough that's worsened over the last 2 weeks. Patient at the beginning of October had healthcare associated pneumonia which was with IV antibiotics with improvement and then 2 weeks ago symptoms started to return. He finished his last treatment of chemotherapy approximately 3 weeks ago. He wears 3 L of oxygen chronically at home with persistent shortness of breath but nothing that's been significantly worse. However now he is unable to even walk around his house without becoming weak and short of breath. He saw his pulmonologist yesterday and had a chest x-ray done at the office that showed worsening right upper lobe pneumonia. He had lab work done 2 days ago which showed a white blood cell count of 18,000 but a normal CMP. Patient denies pain but has noted edema in his ankles over the last few weeks. He was called in [place] yesterday which he took 2 doses of that he was not feeling any better today and they decided to come here for further care.  The history is provided by the patient and a relative.    Past Medical History  Diagnosis Date  . BPH (benign prostatic hypertrophy)     takes Rapaflo daily  . Chronic airway obstruction, not elsewhere classified   . Asthma     uses Spiriva and Dulera daily  . Emphysema lung ([place])   . Chronic bronchitis ([place])   . Shortness of breath     Albuterol inhaler as needed;lying and exertion  .  Pneumonia     "a few times" (08/26/2014)  . Cough     productive but without fever  . Arthritis   . Joint pain   . Cataracts, bilateral     immature  . History of shingles   . Cancer ([place])   . Hypertension    Past Surgical History  Procedure Laterality Date  . No past surgeries    . Video bronchoscopy with endobronchial navigation N/A 03/10/2015    Procedure: VIDEO BRONCHOSCOPY WITH ENDOBRONCHIAL NAVIGATION and PLACEMENT OF 4MM X 7MM FIDUCIAL MARKERS x3;  Surgeon: Collene Gobble, MD;  Location: [facility] OR;  Service: Thoracic;  Laterality: N/A;   Family History  Problem Relation Age of Onset  . COPD Father    Social History  Substance Use Topics  . Smoking status: Former Smoker -- 1.50 packs/day for 45 years    Types: Cigarettes    Quit date: 04/07/2008  . Smokeless tobacco: Former Systems developer    Quit date: 04/07/2008     Comment: quit smoking 3+yrs ago  . Alcohol Use: No     Comment: social drinker prior to sickness    Review of Systems  All other systems reviewed and are negative.     Allergies  Review of patient's allergies indicates no known allergies.  Home Medications   Prior to Admission medications   Medication Sig Start Date End Date Taking? Authorizing Provider  albuterol (PROVENTIL HFA;VENTOLIN HFA) 108 (90 BASE) MCG/ACT inhaler Inhale 2 puffs into the lungs  every 4 (four) hours as needed for wheezing or shortness of breath. 01/27/14   Brand Males, MD  albuterol (PROVENTIL) (2.5 MG/3ML) 0.083% nebulizer solution USE 1 VIAL PER NEBULIZER EVERY 2 HOURS AS NEEDED FOR WHEEZING AND SHORTNESS OF BREATH 07/06/15   Brand Males, MD  amoxicillin-clavulanate (AUGMENTIN) 875-125 MG tablet Take 1 tablet by mouth 2 (two) times daily. 07/06/15   Tanda Rockers, MD  fluconazole (DIFLUCAN) 40 MG/ML suspension Take 200 mg by mouth daily.    Historical Provider, MD  fluticasone (FLONASE) 50 MCG/ACT nasal spray Place 2 sprays into both nostrils daily. Patient taking differently:  Place 2 sprays into both nostrils daily as needed for allergies.  01/11/15   Brand Males, MD  guaiFENesin (MUCINEX) 600 MG 12 hr tablet Take 1 tablet (600 mg total) by mouth 2 (two) times daily. Patient not taking: Reported on 07/14/2015 04/12/15   Barton Dubois, MD  HYDROcodone-acetaminophen (HYCET) 7.5-325 mg/15 ml solution Take 10 mLs by mouth 4 (four) times daily as needed for moderate pain. Patient not taking: Reported on 07/14/2015 05/26/15   Thea Silversmith, MD  HYDROcodone-homatropine (HYDROMET) 5-1.5 MG/5ML syrup Take 2.5 mLs by mouth at bedtime as needed. Patient not taking: Reported on 07/14/2015 03/12/15   Collene Gobble, MD  levofloxacin (LEVAQUIN) 500 MG tablet Take 1 tablet (500 mg total) by mouth daily. 07/14/15   Tammy S Parrett, NP  Magnesium Hydroxide (MILK OF MAGNESIA PO) Take 1 Dose by mouth daily as needed (constipation).    Historical Provider, MD  megestrol (MEGACE) 40 MG/ML suspension Take 5 mLs (200 mg total) by mouth 2 (two) times daily. 06/29/15   Tammy S Parrett, NP  mometasone-formoterol (DULERA) 200-5 MCG/ACT AERO Inhale 2 puffs into the lungs 2 (two) times daily. 03/15/15   Brand Males, MD  Nutritional Supplements (ENSURE HIGH PROTEIN) LIQD Take 1 Bottle by mouth 3 (three) times daily. To be taken 1 bottle three times a day between meals; not as meal substitution.. 04/12/15   Barton Dubois, MD  pantoprazole (PROTONIX) 40 MG tablet Take 1 tablet (40 mg total) by mouth daily at 12 noon. 06/09/15   Erick Colace, NP  polyethylene glycol [facility] / GLYCOLAX) packet Take 17 g by mouth daily. Hold for diarrhea Patient taking differently: Take 17 g by mouth daily as needed for mild constipation or moderate constipation. Hold for diarrhea 04/12/15   Barton Dubois, MD  predniSONE (DELTASONE) 10 MG tablet 4 tabs for 2 days, then 3 tabs for 2 days, 2 tabs for 2 days, then 1 tab daily Patient not taking: Reported on 07/14/2015 07/01/15   Tammy S Parrett, NP  predniSONE  (DELTASONE) 10 MG tablet Take 4 tablets by mouth x 2 days, then 2 tablets by mouth x 2 days, then 1 tablet by mouth x 2 days,then d/c Patient not taking: Reported on 07/14/2015 07/06/15   Tanda Rockers, MD  predniSONE (DELTASONE) 10 MG tablet Take '40mg'$  for 3 days, then '30mg'$  for 3 days, '20mg'$  for 3 days, then continue at '10mg'$  daily 07/12/15   Brand Males, MD  prochlorperazine (COMPAZINE) 10 MG tablet Take 1 tablet (10 mg total) by mouth every 6 (six) hours as needed for nausea or vomiting. Patient not taking: Reported on 06/29/2015 04/26/15   Curt Bears, MD  RAPAFLO 8 MG CAPS capsule Take 1 capsule by mouth daily. 03/04/14   Historical Provider, MD  sucralfate (CARAFATE) 1 G tablet Take 1 tab and dissolve in 6 oz water tid prn for swallowing.  Patient not taking: Reported on 07/14/2015 05/26/15   Thea Silversmith, MD  Tiotropium Bromide Monohydrate (SPIRIVA RESPIMAT) 2.5 MCG/ACT AERS Inhale 2 puffs into the lungs daily. 04/09/15   Brand Males, MD  zolpidem (AMBIEN) 5 MG tablet Take 1 tablet (5 mg total) by mouth at bedtime as needed for sleep. Patient not taking: Reported on 07/14/2015 06/09/15   Erick Colace, NP   BP 106/79 mmHg  Pulse 114  Temp(Src) 98 F (36.7 C) (Oral)  Resp 16  Wt 92 lb (41.731 kg)  SpO2 97% Physical Exam  Constitutional: He is oriented to person, place, and time. He appears well-developed. He appears cachectic. No distress.  HENT:  Head: Normocephalic and atraumatic.  Mouth/Throat: Oropharynx is clear and moist.  Eyes: Conjunctivae and EOM are normal. Pupils are equal, round, and reactive to light.  Neck: Normal range of motion. Neck supple.  Cardiovascular: Regular rhythm and intact distal pulses.  Tachycardia present.   No murmur heard. Pulmonary/Chest: Effort normal. No accessory muscle usage. No respiratory distress. He has decreased breath sounds in the right lower field and the left lower field. He has no wheezes. He has no rales.  Abdominal: Soft. He  exhibits no distension. There is no tenderness. There is no rebound and no guarding.  Musculoskeletal: Normal range of motion. He exhibits edema. He exhibits no tenderness.  1+ edema in bilateral ankles  Neurological: He is alert and oriented to person, place, and time.  Skin: Skin is warm and dry. No rash noted. No erythema.  Psychiatric: He has a normal mood and affect. His behavior is normal.  Nursing note and vitals reviewed.   ED Course  Procedures (including critical care time) Labs Review Labs Reviewed  CBC WITH DIFFERENTIAL/PLATELET - Abnormal; Notable for the following:    WBC 13.7 (*)    RBC 3.45 (*)    Hemoglobin 9.9 (*)    HCT 30.4 (*)    RDW 18.9 (*)    Neutro Abs 12.7 (*)    Lymphs Abs 0.5 (*)    All other components within normal limits  I-STAT CHEM 8, ED - Abnormal; Notable for the following:    Chloride 96 (*)    BUN 22 (*)    Hemoglobin 12.9 (*)    HCT 38.0 (*)    All other components within normal limits  CULTURE, BLOOD (ROUTINE X 2)  CULTURE, BLOOD (ROUTINE X 2)  I-STAT TROPOININ, ED    Imaging Review Dg Chest 2 View  07/14/2015  CLINICAL DATA:  Follow-up right lung infiltrate with fatigue, cough, shortness of breath. EXAM: CHEST  2 VIEW COMPARISON:  June 29, 2015 FINDINGS: The heart size and mediastinal contours are within normal limits. There is patchy consolidation of the right upper lobe slightly worse compared to prior exam. The lungs are hyperinflated. There is no pleural effusion. The visualized skeletal structures are unremarkable. IMPRESSION: Right upper lobe consolidation/ pneumonia slightly worse compared to prior exam. Emphysema. Electronically Signed   By: Abelardo Diesel M.D.   On: 07/14/2015 15:28   I have personally reviewed and evaluated these images and lab results as part of my medical decision-making.   EKG Interpretation   Date/Time:  Thursday July 15 2015 15:55:10 EST Ventricular Rate:  102 PR Interval:  124 QRS Duration:  66 QT Interval:  322 QTC Calculation: 419 R Axis:   -84 Text Interpretation:  Sinus tachycardia Left axis deviation No significant  change since last tracing Confirmed by Maryan Rued  MD, Rayanne Padmanabhan ([id number])  on  07/15/2015 4:21:01 PM      MDM   Final diagnoses:  HCAP (healthcare-associated pneumonia)    Patient is an 81 year old male with multiple medical problems who presents today with healthcare associated pneumonia. He had an x-ray done yesterday which showed worsening right upper lobe pneumonia. He recently underwent chemoradiation for lung cancer and finished his last dose of chemotherapy approximately 3 weeks ago. Had persistent productive cough but denies fever. His weakness has been worsening is now barely able to get around his home. He is taken 2 doses of Levaquin without improvement in his symptoms.  On exam patient is tachycardic, cachectic but currently hemodynamically stable. His oxygen saturations on his home 3 L is 100%.  We'll start patient on healthcare associated pneumonia antibiotics Fortaz and vancomycin. Patient given IV fluids. Repeat CBC and Chem-8 pending so given his generalized weakness and EKG and troponin pending  5:25 PM Leukocytosis of 13,000 labs otherwise unremarkable. Tachycardia improved with IV fluids. Blood cultures ordered and patient admitted  Blanchie Dessert, MD 07/15/15 1726

## 2015-07-15 NOTE — Assessment & Plan Note (Signed)
Slow to resolve flare with associated lung cancer s/p concurrent chemoradiation, recent HCAP ,and FTT -will have him finish abx for now . Check cxr and urine to r/o superimposed new infection .  Progressive radiation change/pneumonitis may be contributing factor.  Will adjust prednisone slightly   Plan  Finish Augmentin as directed  Mucinex DM Twice daily  As needed  Cough/congestion  Increase Prednisone '20mg'$  daily  Ensure Twice daily   Try to get in some water.  Check Chest xray and urine today.  Follow up in 4 weeks with Dr. Chase Caller as planned and As needed   Please contact office for sooner follow up if symptoms do not improve or worsen or seek emergency care

## 2015-07-15 NOTE — Assessment & Plan Note (Signed)
Recent right sided PNA /HCAP ? Slow to resolve vs radiation changes pneumonitis  on most recent CT chest   Discussed hospital admission with pt ,  He declines and would like to try OP tx , he assures me he will call back if not improivng or worsens   Plan  Finish Augmentin as directed  Mucinex DM Twice daily  As needed  Cough/congestion  Increase Prednisone '20mg'$  daily  Ensure Twice daily   Try to get in some water.  Check Chest xray and urine today.  Follow up in 4 weeks with Dr. Chase Caller as planned and As needed   Please contact office for sooner follow up if symptoms do not improve or worsen or seek emergency care

## 2015-07-15 NOTE — ED Notes (Signed)
Pt can go to floor at 18:00

## 2015-07-15 NOTE — Progress Notes (Signed)
Utilization Review completed.  Gittel Mccamish RN CM  

## 2015-07-15 NOTE — Plan of Care (Signed)
Problem: Safety: Goal: Ability to remain free from injury will improve Outcome: Progressing Pt has non-skid socks on, bed rails are up, bed alarm is on, and call bell is within reach.

## 2015-07-16 DIAGNOSIS — J700 Acute pulmonary manifestations due to radiation: Secondary | ICD-10-CM

## 2015-07-16 LAB — URINALYSIS, ROUTINE W REFLEX MICROSCOPIC
BILIRUBIN URINE: NEGATIVE
Glucose, UA: NEGATIVE mg/dL
HGB URINE DIPSTICK: NEGATIVE
Ketones, ur: NEGATIVE mg/dL
Leukocytes, UA: NEGATIVE
Nitrite: NEGATIVE
PROTEIN: NEGATIVE mg/dL
Specific Gravity, Urine: 1.021 (ref 1.005–1.030)
pH: 6.5 (ref 5.0–8.0)

## 2015-07-16 LAB — COMPREHENSIVE METABOLIC PANEL
ALK PHOS: 52 U/L (ref 38–126)
ALT: 18 U/L (ref 17–63)
ANION GAP: 6 (ref 5–15)
AST: 24 U/L (ref 15–41)
Albumin: 2.2 g/dL — ABNORMAL LOW (ref 3.5–5.0)
BILIRUBIN TOTAL: 1.2 mg/dL (ref 0.3–1.2)
BUN: 20 mg/dL (ref 6–20)
CALCIUM: 8.3 mg/dL — AB (ref 8.9–10.3)
CO2: 27 mmol/L (ref 22–32)
CREATININE: 0.8 mg/dL (ref 0.61–1.24)
Chloride: 104 mmol/L (ref 101–111)
Glucose, Bld: 88 mg/dL (ref 65–99)
Potassium: 4.4 mmol/L (ref 3.5–5.1)
Sodium: 137 mmol/L (ref 135–145)
TOTAL PROTEIN: 5.9 g/dL — AB (ref 6.5–8.1)

## 2015-07-16 LAB — CBC WITH DIFFERENTIAL/PLATELET
BASOS ABS: 0 10*3/uL (ref 0.0–0.1)
Basophils Relative: 0 %
EOS ABS: 0 10*3/uL (ref 0.0–0.7)
EOS PCT: 0 %
HCT: 30.2 % — ABNORMAL LOW (ref 39.0–52.0)
Hemoglobin: 9.9 g/dL — ABNORMAL LOW (ref 13.0–17.0)
LYMPHS PCT: 2 %
Lymphs Abs: 0.2 10*3/uL — ABNORMAL LOW (ref 0.7–4.0)
MCH: 28.5 pg (ref 26.0–34.0)
MCHC: 32.8 g/dL (ref 30.0–36.0)
MCV: 87 fL (ref 78.0–100.0)
MONO ABS: 0.5 10*3/uL (ref 0.1–1.0)
Monocytes Relative: 6 %
Neutro Abs: 8.1 10*3/uL — ABNORMAL HIGH (ref 1.7–7.7)
Neutrophils Relative %: 93 %
PLATELETS: 177 10*3/uL (ref 150–400)
RBC: 3.47 MIL/uL — ABNORMAL LOW (ref 4.22–5.81)
RDW: 18.6 % — AB (ref 11.5–15.5)
WBC: 8.8 10*3/uL (ref 4.0–10.5)

## 2015-07-16 LAB — RETICULOCYTES
RBC.: 3.51 MIL/uL — ABNORMAL LOW (ref 4.22–5.81)
Retic Count, Absolute: 101.8 10*3/uL (ref 19.0–186.0)
Retic Ct Pct: 2.9 % (ref 0.4–3.1)

## 2015-07-16 LAB — EXPECTORATED SPUTUM ASSESSMENT W REFEX TO RESP CULTURE

## 2015-07-16 LAB — HIV ANTIBODY (ROUTINE TESTING W REFLEX): HIV SCREEN 4TH GENERATION: NONREACTIVE

## 2015-07-16 LAB — STREP PNEUMONIAE URINARY ANTIGEN: Strep Pneumo Urinary Antigen: NEGATIVE

## 2015-07-16 LAB — PHOSPHORUS: PHOSPHORUS: 2.3 mg/dL — AB (ref 2.5–4.6)

## 2015-07-16 LAB — EXPECTORATED SPUTUM ASSESSMENT W GRAM STAIN, RFLX TO RESP C

## 2015-07-16 LAB — MAGNESIUM: MAGNESIUM: 1.7 mg/dL (ref 1.7–2.4)

## 2015-07-16 MED ORDER — POTASSIUM PHOSPHATES 15 MMOLE/5ML IV SOLN
15.0000 mmol | Freq: Once | INTRAVENOUS | Status: AC
  Administered 2015-07-16: 15 mmol via INTRAVENOUS
  Filled 2015-07-16: qty 5

## 2015-07-16 MED ORDER — SENNA 8.6 MG PO TABS
2.0000 | ORAL_TABLET | Freq: Every day | ORAL | Status: DC
  Administered 2015-07-16 – 2015-07-18 (×3): 17.2 mg via ORAL
  Filled 2015-07-16 (×3): qty 2

## 2015-07-16 MED ORDER — HYDROCOD POLST-CPM POLST ER 10-8 MG/5ML PO SUER
5.0000 mL | Freq: Two times a day (BID) | ORAL | Status: DC
  Administered 2015-07-16 – 2015-07-19 (×7): 5 mL via ORAL
  Filled 2015-07-16 (×7): qty 5

## 2015-07-16 MED ORDER — HYDROCODONE-ACETAMINOPHEN 10-325 MG PO TABS: 1.0000 | ORAL_TABLET | ORAL | Status: DC | PRN

## 2015-07-16 MED ORDER — MAGNESIUM SULFATE 2 GM/50ML IV SOLN
2.0000 g | Freq: Once | INTRAVENOUS | Status: AC
  Administered 2015-07-16: 2 g via INTRAVENOUS
  Filled 2015-07-16: qty 50

## 2015-07-16 MED ORDER — INFLUENZA VAC SPLIT QUAD 0.5 ML IM SUSY
0.5000 mL | PREFILLED_SYRINGE | INTRAMUSCULAR | Status: DC
  Filled 2015-07-16 (×2): qty 0.5

## 2015-07-16 MED ORDER — SALINE SPRAY 0.65 % NA SOLN
1.0000 | NASAL | Status: DC | PRN
  Administered 2015-07-16: 1 via NASAL
  Filled 2015-07-16: qty 44

## 2015-07-16 MED ORDER — BUDESONIDE 0.5 MG/2ML IN SUSP
0.5000 mg | Freq: Two times a day (BID) | RESPIRATORY_TRACT | Status: DC
  Administered 2015-07-16 – 2015-07-19 (×6): 0.5 mg via RESPIRATORY_TRACT
  Filled 2015-07-16 (×6): qty 2

## 2015-07-16 NOTE — Progress Notes (Signed)
[facility] PROGRESS NOTE  Frank Garcia RJJ:[id number] DOB: 1929-06-06 DOA: 07/15/2015 PCP: Brand Males, MD  Assessment/Plan: #1 healthcare associated pneumonia Per chest x-ray. Patient also noted to her presenting with worsening weakness. Patient currently afebrile. Patient has been pancultured and cultures are pending. Continue empiric IV vancomycin and IV Fortaz.  #2 anemia of chronic disease Patient with no overt bleeding. Follow H&H.  #3 failure to thrive Patient is very emaciated, cachectic, with failure to thrive. Patient with lung cancer. Patient now with multiple hospitalizations with 6 admissions over the past 3 months. Patient with a poor prognosis. We'll consulted palliative care for further evaluation and management.  4 history of severe COPD/chronic respiratory failure on home O2 3 L nasal cannula Stable. Continue nebulizers.  #5 BPH/urinary retention Flomax.  #6 pedal edema Likely secondary to hypoalbuminemia. Pre-albumin levels are 17.0. Patient ensure. Patient also on Megace.  #7 stage II adenocarcinoma of the lung. Oncology has been notified via [place].  #8 prophylaxis Lovenox for DVT prophylaxis.  Code Status: Full Family Communication: Updated patient. No family at bedside. Disposition Plan: Home when medically stable with clinical improvement.   Consultants:  Palliative care pending  Procedures:  Chest x-ray 07/15/2015  Antibiotics:  IV vancomycin 07/15/2015  IV Tressie Ellis 07/15/2015  HPI/Subjective: Patient states feeling a little better than on admission. Patient however still complaining of significant weakness.  Objective: Filed Vitals:   07/16/15 0516  BP: 100/54  Pulse: 101  Temp: 98.1 F (36.7 C)  Resp: 24    Intake/Output Summary (Last 24 hours) at 07/16/15 1223 Last data filed at 07/16/15 0160  Gross per 24 hour  Intake 1225.83 ml  Output    525 ml  Net 700.83 ml   Filed Weights   07/15/15 1416 07/15/15 1840   Weight: 41.731 kg (92 lb) 41.7 kg (91 lb 14.9 oz)    Exam:   General:  Frail. Cachectic.  Cardiovascular: Regular rate rhythm no murmurs rubs or gallops.  Respiratory: Clear to auscultation bilaterally. Decreased breath sounds diffusely.  Abdomen: Soft, nontender, nondistended, positive bowels as.  Musculoskeletal: No clubbing cyanosis or edema.  Data Reviewed: Basic Metabolic Panel:  Recent Labs Lab 07/13/15 0804 07/15/15 1626 07/16/15 0507  NA 141 136 137  K 4.3 4.0 4.4  CL 100 96* 104  CO2 30  --  27  GLUCOSE 100* 73 88  BUN 23 22* 20  CREATININE 0.84 1.00 0.80  CALCIUM 9.6  --  8.3*  MG 2.2  --  1.7  PHOS 3.1  --  2.3*   Liver Function Tests:  Recent Labs Lab 07/13/15 0804 07/16/15 0507  AST 16 24  ALT 16 18  ALKPHOS 71 52  BILITOT 1.1 1.2  PROT 7.4 5.9*  ALBUMIN 3.3* 2.2*   No results for input(s): LIPASE, AMYLASE in the last 168 hours. No results for input(s): AMMONIA in the last 168 hours. CBC:  Recent Labs Lab 07/13/15 0804 07/15/15 1617 07/15/15 1626 07/16/15 0507  WBC 18.0* 13.7*  --  8.8  NEUTROABS 16.9* 12.7*  --  8.1*  HGB 11.6* 9.9* 12.9* 9.9*  HCT 36.6* 30.4* 38.0* 30.2*  MCV 89.2 88.1  --  87.0  PLT 367.0 Result may be falsely decreased due to platelet clumping. 269  --  177   Cardiac Enzymes: No results for input(s): CKTOTAL, CKMB, CKMBINDEX, TROPONINI in the last 168 hours. BNP (last 3 results)  Recent Labs  08/26/14 1527 04/11/15 0011 06/07/15 1755  BNP 46.7 29.7 56.7  ProBNP (last 3 results) No results for input(s): PROBNP in the last 8760 hours.  CBG: No results for input(s): GLUCAP in the last 168 hours.  Recent Results (from the past 240 hour(s))  Urine culture     Status: None   Collection Time: 07/14/15 12:48 PM  Result Value Ref Range Status   Colony Count NO GROWTH  Final   Organism ID, Bacteria NO GROWTH  Final  Culture, blood (routine x 2)     Status: None (Preliminary result)   Collection Time:  07/15/15  5:30 PM  Result Value Ref Range Status   Specimen Description BLOOD LEFT ANTECUBITAL  Final   Special Requests BOTTLES DRAWN AEROBIC AND ANAEROBIC 5 CC  Final   Culture   Final    NO GROWTH < 24 HOURS Performed at [facility]    Report Status PENDING  Incomplete  Culture, blood (routine x 2)     Status: None (Preliminary result)   Collection Time: 07/15/15  5:35 PM  Result Value Ref Range Status   Specimen Description BLOOD BLOOD RIGHT FOREARM  Final   Special Requests BOTTLES DRAWN AEROBIC AND ANAEROBIC 5 CC  Final   Culture   Final    NO GROWTH < 24 HOURS Performed at [facility]    Report Status PENDING  Incomplete  Culture, sputum-assessment     Status: None   Collection Time: 07/16/15  3:07 AM  Result Value Ref Range Status   Specimen Description SPUTUM  Final   Special Requests NONE  Final   Sputum evaluation   Final    THIS SPECIMEN IS ACCEPTABLE. RESPIRATORY CULTURE REPORT TO FOLLOW.   Report Status 07/16/2015 FINAL  Final     Studies: Dg Chest 2 View  07/15/2015  CLINICAL DATA:  Right upper lobe nodule - persistent cough- hx COPD - hx asthma - hx emphysema - former smoker EXAM: CHEST - 2 VIEW COMPARISON:  07/14/2015 FINDINGS: 6.3 cm focal consolidation in the posterior right upper lobe, progressive since previous exam. Fiducial markers project anterior to the process. Left lung remains clear. Heart size normal. Persistent right middle lobe atelectasis/consolidation. No effusion. No pneumothorax. Visualized skeletal structures are unremarkable. IMPRESSION: 1. Progressive posterior right upper lobe  consolidation. Electronically Signed   By: Lucrezia Europe M.D.   On: 07/15/2015 19:33   Dg Chest 2 View  07/14/2015  CLINICAL DATA:  Follow-up right lung infiltrate with fatigue, cough, shortness of breath. EXAM: CHEST  2 VIEW COMPARISON:  June 29, 2015 FINDINGS: The heart size and mediastinal contours are within normal limits. There is patchy  consolidation of the right upper lobe slightly worse compared to prior exam. The lungs are hyperinflated. There is no pleural effusion. The visualized skeletal structures are unremarkable. IMPRESSION: Right upper lobe consolidation/ pneumonia slightly worse compared to prior exam. Emphysema. Electronically Signed   By: Abelardo Diesel M.D.   On: 07/14/2015 15:28    Scheduled Meds: . cefTAZidime (FORTAZ)  IV  2 g Intravenous Q12H  . enoxaparin (LOVENOX) injection  30 mg Subcutaneous Q24H  . feeding supplement (ENSURE ENLIVE)  237 mL Oral TID BM  . fluticasone  2 spray Each Nare Daily  . guaiFENesin  1,200 mg Oral BID  . levalbuterol  0.63 mg Nebulization TID  . mometasone-formoterol  2 puff Inhalation BID  . pantoprazole  40 mg Oral Q breakfast  . polyethylene glycol  17 g Oral Daily  . potassium phosphate IVPB (mmol)  15 mmol Intravenous Once  .  predniSONE  40 mg Oral QAC breakfast  . tamsulosin  0.4 mg Oral QPC supper  . tiotropium  18 mcg Inhalation Daily  . vancomycin  1,000 mg Intravenous Q24H   Continuous Infusions: . sodium chloride 75 mL/hr at 07/16/15 4650    Principal Problem:   HCAP (healthcare-associated pneumonia) Active Problems:   COPD, very severe (HCC)   TOBACCO ABUSE-HISTORY OF   Urinary retention   Chronic respiratory failure with hypoxia (HCC)   Protein-calorie malnutrition, severe (HCC)   Adenocarcinoma of lung, stage 2 (HCC)   BPH (benign prostatic hyperplasia)   Malignant neoplasm of upper lobe of right lung (HCC)   FTT (failure to thrive) in adult   Pedal edema    Time spent: 54 minutes    Jullien Granquist M.D. [facility] Pager [phone]. If 7PM-7AM, please contact night-coverage at [email or web address], password [facility] 07/16/2015, 12:23 PM  LOS: 1 day

## 2015-07-16 NOTE — Progress Notes (Signed)
Met with Frank Garcia, family not in the room. I discussed goals of care and assessed his symptom burden. Struggles with Dyspnea, Chest tightness, worse last few days-weeks, severe fatigue and weakness. Suspect there may be radiation pnuemonitis involved- often delayed and he is about a month out. Will start him on high dose budesonide-Pulmocort nebs- would continue oral steroids for now and slow taper. Added on scheduled Tussionex for dyspnea, cough, congestion and pain control. We discussed his ciode status and worst case senario- he clearly does not want to be admitted to teh ICU and be on a ventilator and understands limitations of CPR given his lung cancer and age alone as well as his emphysema- I nmade a stronmg recommendation for DNR. He says he has been taling about it with his daughter. His main goal right now is to go home-he does not want skilled care or to be rehospitalized. Will need to have a more detailed conversation when daughter can be present. Will have PMT follow up this weekend and if he discharged in next 24 hour I can make a referral top home based palliative to to follow him.  Lane Hacker, DO Palliative Medicine [phone]

## 2015-07-16 NOTE — Progress Notes (Signed)
Initial Nutrition Assessment  DOCUMENTATION CODES:   Severe malnutrition in context of chronic illness, Underweight  INTERVENTION:  - Continue Ensure Enlive po BID, each supplement provides 350 kcal and 20 grams of protein - Encourage PO intakes of meals and supplements - RD will continue to monitor for needs  NUTRITION DIAGNOSIS:   Increased nutrient needs related to catabolic illness, cancer and cancer related treatments as evidenced by estimated needs.  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 Patient was seen at his pulmonary doctors office 1 day prior to admission when he had complaints of generalized fatigue and feeling very weak, tiredness all the time, cough productive of yellowish sputum. Patient stated chest x-ray was done and he was called and diagnosed with a pneumonia and started on a course of Levaquin and a steroid taper.  Pt seen for consult. BMI indicates underweight status. Pt states for breakfast this AM he ate eggs, sausage, and grits. This is a bigger breakfast than he would typically eat but he denies abdominal pain or nausea associated with intakes. Asked pt about appetite and typical intakes at home but despite asking about this x2 pt did not respond.  Noted unopened Ensure Enlive bottle on bedside table. Pt states he drinks a similar product at home and typically drinks 2/day. Pt unable to provide hx related to weight trends.  Per chart review, weight has been fluctuating the past 2 months. In the past 1 month he has lost 4 lbs (4.2% body weight) which is not significant for time frame. Severe muscle and fat wasting to all areas.  Unsure of intakes at home but likely not fully meeting needs. Medications reviewed. Labs reviewed; Ca: 8.3 mg/dL, Phos: 2.3 mg/dL.   Diet Order:  Diet regular Room service appropriate?: Yes; Fluid consistency:: Thin  Skin:  Reviewed, no issues  Last BM:  11/15  Height:   Ht Readings from Last 1 Encounters:  07/15/15 '5\' 7"'$  (1.702 m)    Weight:   Wt Readings from Last 1 Encounters:  07/15/15 91 lb 14.9 oz (41.7 kg)    Ideal Body Weight:  67.27 kg (kg)  BMI:  Body mass index is 14.4 kg/(m^2).  Estimated Nutritional Needs:   Kcal:  1450-1650  Protein:  62-75 grams  Fluid:  1.8-2 L/day  EDUCATION NEEDS:   No education needs identified at this time     Jarome Matin, RD, LDN Inpatient Clinical Dietitian Pager # [phone] After hours/weekend pager # [phone]

## 2015-07-16 NOTE — Progress Notes (Signed)
Occupational Therapy Evaluation Patient Details Name: Frank Garcia MRN: [id number] DOB: 09/04/1928 Today's Date: 07/16/2015    History of Present Illness Frank Garcia is a 81 y.o. 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 Will benefit from skilled OT to maximize independence and safety prior to discharge. OT will follow.    Follow Up Recommendations  Home health OT;Supervision/Assistance - 24 hour    Equipment Recommendations  3 in 1 bedside comode    Recommendations for Other Services PT consult     Precautions / Restrictions Precautions Precautions: Fall Restrictions Weight Bearing Restrictions: No      Mobility Bed Mobility Overal bed mobility: Needs Assistance Bed Mobility: Supine to Sit     Supine to sit: Min assist;HOB elevated        Transfers Overall transfer level: Needs assistance Equipment used: 1 person hand held assist Transfers: Sit to/from Stand Sit to Stand: Min assist              Balance                                            ADL Overall ADL's : Needs assistance/impaired     Grooming: Wash/dry hands;Wash/dry face;Minimal assistance;Sitting           Upper Body Dressing : Moderate assistance;Sitting (doff robe)   Lower Body Dressing: Total assistance;Sit to/from stand   Toilet Transfer: Minimal assistance;BSC   Toileting- Clothing Manipulation and Hygiene: Total assistance       Functional mobility during ADLs: Minimal assistance General ADL Comments: Patient received in bed with HOB raised almost to 90 degree angle. Reports "hot" and "short of breath." Requested to sit EOB. Patient able to perform bed  mobility to EOB with min A. Assisted patient with doffing robe and adjusting socks. Patient stood once with min A but sat right back down due to SOB. Offered to help patient to recliner and he adamantly refused, stating he sat there yesterday and it "wasn't good." Reported to RN that patient wanted to sit EOB. RN reported OK. Bed alarm on for standing and bedside table placed in front of patient. All needs within reach. Patient educated to call for help if needed.     Vision     Perception     Praxis      Pertinent Vitals/Pain Pain Assessment: No/denies pain     Hand Dominance Right   Extremity/Trunk Assessment Upper Extremity Assessment Upper Extremity Assessment: Generalized weakness   Lower Extremity Assessment Lower Extremity Assessment: Defer to PT evaluation       Communication Communication Communication: No difficulties   Cognition Arousal/Alertness: Awake/alert Behavior During Therapy: WFL for tasks assessed/performed Overall Cognitive Status: Within Functional Limits for tasks assessed                     General Comments       Exercises       Shoulder Instructions      Home Living Family/patient expects to be discharged to:: Private residence Living Arrangements: Children Available Help at Discharge: Family;Available PRN/intermittently (daughter works) Type of Home: [employer]  Home Access: Ramped entrance     Home Layout: One level     Bathroom Shower/Tub: Other (comment) (patient sponge bathes at home)   Bathroom Toilet: Standard     Home Equipment: Cane - single point;Walker - 2 wheels;Wheelchair - manual   Additional Comments: uses O2 tank to get around per patient      Prior Functioning/Environment Level of Independence: Independent with assistive device(s)        Comments: tells me he uses his O2 tank like a cane    OT Diagnosis: Generalized weakness   OT Problem List: Decreased strength;Decreased activity  tolerance;Cardiopulmonary status limiting activity   OT Treatment/Interventions: Self-care/ADL training;Therapeutic exercise;Energy conservation;DME and/or AE instruction;Therapeutic activities;Patient/family education    OT Goals(Current goals can be found in the care plan section) Acute Rehab OT Goals Patient Stated Goal: to be able to breathe better OT Goal Formulation: With patient Time For Goal Achievement: 07/30/15 Potential to Achieve Goals: Good  OT Frequency: Min 2X/week   Barriers to D/C: Decreased caregiver support  daughter works during the day       Co-evaluation              End of Session Equipment Utilized During Treatment: Oxygen Nurse Communication: Mobility status;Other (comment) (patient requesting to sit EOB)  Activity Tolerance: Patient limited by fatigue Patient left: in bed;with call bell/phone within reach;with bed alarm set   Time: 1601-0932 OT Time Calculation (min): 18 min Charges:  OT General Charges $OT Visit: 1 Procedure OT Evaluation $Initial OT Evaluation Tier I: 1 Procedure G-Codes:    Jibran Crookshanks A 08-13-15, 10:46 AM

## 2015-07-16 NOTE — Progress Notes (Addendum)
PT Cancellation Note  Patient Details Name: Frank Garcia MRN: [id number] DOB: [birth year]   Cancelled Treatment:    Reason Eval/Treat Not Completed: Other (comment) Pt currently not happy with IV machine beeping (occluded line) and wishes to have this corrected prior to attempting to participate.  RN notified.  Will check back as schedule permits.   Yamel Bale,KATHrine E 07/16/2015, 11:43 AM Carmelia Bake, PT, DPT 07/16/2015 Pager: [phone]

## 2015-07-17 LAB — BASIC METABOLIC PANEL
Anion gap: 8 (ref 5–15)
BUN: 17 mg/dL (ref 6–20)
CO2: 27 mmol/L (ref 22–32)
Calcium: 8.3 mg/dL — ABNORMAL LOW (ref 8.9–10.3)
Chloride: 102 mmol/L (ref 101–111)
Creatinine, Ser: 0.74 mg/dL (ref 0.61–1.24)
GFR calc Af Amer: >60 mL/min (ref >60)
GFR calc non Af Amer: >60 mL/min (ref >60)
GLUCOSE: 110 mg/dL — AB (ref 65–99)
POTASSIUM: 4.3 mmol/L (ref 3.5–5.1)
Sodium: 137 mmol/L (ref 135–145)

## 2015-07-17 LAB — CBC
HCT: 27.1 % — ABNORMAL LOW (ref 39.0–52.0)
Hemoglobin: 8.8 g/dL — ABNORMAL LOW (ref 13.0–17.0)
MCH: 28.7 pg (ref 26.0–34.0)
MCHC: 32.5 g/dL (ref 30.0–36.0)
MCV: 88.3 fL (ref 78.0–100.0)
PLATELETS: 189 10*3/uL (ref 150–400)
RBC: 3.07 MIL/uL — ABNORMAL LOW (ref 4.22–5.81)
RDW: 18.8 % — AB (ref 11.5–15.5)
WBC: 10.4 10*3/uL (ref 4.0–10.5)

## 2015-07-17 LAB — URINE CULTURE: CULTURE: NO GROWTH

## 2015-07-17 MED ORDER — LEVALBUTEROL HCL 0.63 MG/3ML IN NEBU: 0.6300 mg | INHALATION_SOLUTION | RESPIRATORY_TRACT | Status: DC | PRN

## 2015-07-17 MED ORDER — IPRATROPIUM BROMIDE 0.02 % IN SOLN: 0.5000 mg | RESPIRATORY_TRACT | Status: DC | PRN

## 2015-07-17 NOTE — Evaluation (Signed)
Physical Therapy Evaluation Patient Details Name: Frank Garcia MRN: [id number] DOB: [birth year] Today's Date: 07/17/2015   History of Present Illness  81 yo male admitted with Pna. Hx of COPD-O2 dep 3L, asthma, HTN, recent diagnosis of non small cell carcinoma.   Clinical Impression  On eval, pt was Min guard assist for mobility-walked ~75 feet with RW. Dyspnea 2/4. Remained on [place] O2. O2 sats: start-97% 3L start of session, 100% 3L end of session. Pt tolerated session fairly well. Appears generally weak and fatigued. Recommend HHPT    Follow Up Recommendations Home health PT;Supervision - Intermittent    Equipment Recommendations  None recommended by PT    Recommendations for Other Services       Precautions / Restrictions Precautions Precautions: Fall Precaution Comments: O2 dep Restrictions Weight Bearing Restrictions: No      Mobility  Bed Mobility               General bed mobility comments: sitting EOB at start of session  Transfers Overall transfer level: Needs assistance Equipment used: Rolling walker (2 wheeled) Transfers: Sit to/from Stand Sit to Stand: Min guard         General transfer comment: close guard for safety. VCs safety, hand placement. Increased time.   Ambulation/Gait Ambulation/Gait assistance: Min guard Ambulation Distance (Feet): 75 Feet Assistive device: Rolling walker (2 wheeled) Gait Pattern/deviations: Step-through pattern;Decreased stride length;Trunk flexed     General Gait Details: slow gait speed but steady with use of RW. VCs safe use of RW. Remained on 3L O2  Stairs            Wheelchair Mobility    Modified Rankin (Stroke Patients Only)       Balance Overall balance assessment: Needs assistance         Standing balance support: During functional activity Standing balance-Leahy Scale: Fair                               Pertinent Vitals/Pain Pain Assessment: No/denies pain    Home  Living Family/patient expects to be discharged to:: Private residence Living Arrangements: Children Available Help at Discharge: Family;Available PRN/intermittently (daughter works) Type of Home: House Home Access: [place]: [place]: [place] - single point;Walker - 2 wheels;Wheelchair - manual      Prior Function Level of Independence: Independent with assistive device(s)         Comments: uses O2 tank vs cane     Hand Dominance        Extremity/Trunk Assessment   Upper Extremity Assessment: Defer to OT evaluation           Lower Extremity Assessment: Generalized weakness      Cervical / Trunk Assessment: Kyphotic  Communication   Communication: No difficulties  Cognition Arousal/Alertness: Awake/alert Behavior During Therapy: WFL for tasks assessed/performed Overall Cognitive Status: Within Functional Limits for tasks assessed                      General Comments      Exercises        Assessment/Plan    PT Assessment Patient needs continued PT services  PT Diagnosis Difficulty walking;Generalized weakness   PT Problem List Decreased mobility;Decreased strength;Decreased activity tolerance  PT Treatment Interventions DME instruction;Gait training;Functional mobility training;Therapeutic activities;Patient/family education;Therapeutic exercise   PT Goals (Current goals can be found in the Care Plan section)  Acute Rehab PT Goals Patient Stated Goal: to be able to breathe better PT Goal Formulation: With patient Time For Goal Achievement: 07/31/15 Potential to Achieve Goals: Good    Frequency Min 3X/week   Barriers to discharge        Co-evaluation               End of Session Equipment Utilized During Treatment: Oxygen Activity Tolerance: Patient limited by fatigue Patient left: in chair;with call bell/phone within reach           Time: 1010-1027 PT Time Calculation (min) (ACUTE  ONLY): 17 min   Charges:   PT Evaluation $Initial PT Evaluation Tier I: 1 Procedure     PT G Codes:        Weston Anna, MPT Pager: [phone]

## 2015-07-17 NOTE — Progress Notes (Signed)
[facility] PROGRESS NOTE  Frank PEREZPEREZ JME:[id number] DOB: Oct 27, 1928 DOA: 07/15/2015 PCP: Brand Males, MD  Assessment/Plan: #1 healthcare associated pneumonia Per chest x-ray. Patient also noted to her presenting with worsening weakness. Patient currently afebrile. Patient has been pancultured and cultures are pending. Continue empiric IV vancomycin and IV Fortaz.If Blood cultures remain negative will d/c Vancomycin tomorrow.  #2 anemia of chronic disease Patient with no overt bleeding. Follow H&H.  #3 failure to thrive Patient is very emaciated, cachectic, with failure to thrive. Patient with lung cancer. Patient now with multiple hospitalizations with 6 admissions over the past 3 months. Patient with a poor prognosis. Palliative care have been consulted and in process of meeting with family.  4 history of severe COPD/chronic respiratory failure on home O2 3 L nasal cannula Stable. Continue nebulizers.  #5 BPH/urinary retention Flomax.  #6 pedal edema Likely secondary to hypoalbuminemia. Pre-albumin levels are 17.0. Patient on ensure. Patient also on Megace.  #7 stage II adenocarcinoma of the lung. Oncology has been notified via [place].  #8 prophylaxis Lovenox for DVT prophylaxis.  Code Status: Full Family Communication: Updated patient. No family at bedside. Disposition Plan: Transfer to [place] when medically stable with clinical improvement, hopefully in 1-2 days.   Consultants:  Palliative care: Dr Hilma Favors 07/16/2015  Procedures:  Chest x-ray 07/15/2015  Antibiotics:  IV vancomycin 07/15/2015  IV Tressie Ellis 07/15/2015  HPI/Subjective: Patient states feeling a little better.Patient asking when he can go home. No CP. Good PO intake.  Objective: Filed Vitals:   07/17/15 0435  BP: 108/60  Pulse: 88  Temp: 98.5 F (36.9 C)  Resp: 18    Intake/Output Summary (Last 24 hours) at 07/17/15 0916 Last data filed at 07/17/15 0836  Gross per 24 hour   Intake 936.25 ml  Output    650 ml  Net 286.25 ml   Filed Weights   07/15/15 1416 07/15/15 1840  Weight: 41.731 kg (92 lb) 41.7 kg (91 lb 14.9 oz)    Exam:   General:  Frail. Cachectic.  Cardiovascular: Regular rate rhythm no murmurs rubs or gallops.  Respiratory: Clear to auscultation bilaterally. Decreased breath sounds diffusely.Fair air movement.  Abdomen: Soft, nontender, nondistended, positive bowels as.  Musculoskeletal: No clubbing cyanosis or edema.  Data Reviewed: Basic Metabolic Panel:  Recent Labs Lab 07/13/15 0804 07/15/15 1626 07/16/15 0507 07/17/15 0537  NA 141 136 137 137  K 4.3 4.0 4.4 4.3  CL 100 96* 104 102  CO2 30  --  27 27  GLUCOSE 100* 73 88 110*  BUN 23 22* 20 17  CREATININE 0.84 1.00 0.80 0.74  CALCIUM 9.6  --  8.3* 8.3*  MG 2.2  --  1.7  --   PHOS 3.1  --  2.3*  --    Liver Function Tests:  Recent Labs Lab 07/13/15 0804 07/16/15 0507  AST 16 24  ALT 16 18  ALKPHOS 71 52  BILITOT 1.1 1.2  PROT 7.4 5.9*  ALBUMIN 3.3* 2.2*   No results for input(s): LIPASE, AMYLASE in the last 168 hours. No results for input(s): AMMONIA in the last 168 hours. CBC:  Recent Labs Lab 07/13/15 0804 07/15/15 1617 07/15/15 1626 07/16/15 0507 07/17/15 0537  WBC 18.0* 13.7*  --  8.8 10.4  NEUTROABS 16.9* 12.7*  --  8.1*  --   HGB 11.6* 9.9* 12.9* 9.9* 8.8*  HCT 36.6* 30.4* 38.0* 30.2* 27.1*  MCV 89.2 88.1  --  87.0 88.3  PLT 367.0 Result may  be falsely decreased due to platelet clumping. 269  --  177 189   Cardiac Enzymes: No results for input(s): CKTOTAL, CKMB, CKMBINDEX, TROPONINI in the last 168 hours. BNP (last 3 results)  Recent Labs  08/26/14 1527 04/11/15 0011 06/07/15 1755  BNP 46.7 29.7 56.7    ProBNP (last 3 results) No results for input(s): PROBNP in the last 8760 hours.  CBG: No results for input(s): GLUCAP in the last 168 hours.  Recent Results (from the past 240 hour(s))  Urine culture     Status: None    Collection Time: 07/14/15 12:48 PM  Result Value Ref Range Status   Colony Count NO GROWTH  Final   Organism ID, Bacteria NO GROWTH  Final  Culture, blood (routine x 2)     Status: None (Preliminary result)   Collection Time: 07/15/15  5:30 PM  Result Value Ref Range Status   Specimen Description BLOOD LEFT ANTECUBITAL  Final   Special Requests BOTTLES DRAWN AEROBIC AND ANAEROBIC 5 CC  Final   Culture   Final    NO GROWTH < 24 HOURS Performed at [facility]    Report Status PENDING  Incomplete  Culture, blood (routine x 2)     Status: None (Preliminary result)   Collection Time: 07/15/15  5:35 PM  Result Value Ref Range Status   Specimen Description BLOOD BLOOD RIGHT FOREARM  Final   Special Requests BOTTLES DRAWN AEROBIC AND ANAEROBIC 5 CC  Final   Culture   Final    NO GROWTH < 24 HOURS Performed at [facility]    Report Status PENDING  Incomplete  Culture, sputum-assessment     Status: None   Collection Time: 07/16/15  3:07 AM  Result Value Ref Range Status   Specimen Description SPUTUM  Final   Special Requests NONE  Final   Sputum evaluation   Final    THIS SPECIMEN IS ACCEPTABLE. RESPIRATORY CULTURE REPORT TO FOLLOW.   Report Status 07/16/2015 FINAL  Final     Studies: Dg Chest 2 View  07/15/2015  CLINICAL DATA:  Right upper lobe nodule - persistent cough- hx COPD - hx asthma - hx emphysema - former smoker EXAM: CHEST - 2 VIEW COMPARISON:  07/14/2015 FINDINGS: 6.3 cm focal consolidation in the posterior right upper lobe, progressive since previous exam. Fiducial markers project anterior to the process. Left lung remains clear. Heart size normal. Persistent right middle lobe atelectasis/consolidation. No effusion. No pneumothorax. Visualized skeletal structures are unremarkable. IMPRESSION: 1. Progressive posterior right upper lobe  consolidation. Electronically Signed   By: Lucrezia Europe M.D.   On: 07/15/2015 19:33    Scheduled Meds: . budesonide  (PULMICORT) nebulizer solution  0.5 mg Nebulization BID  . cefTAZidime (FORTAZ)  IV  2 g Intravenous Q12H  . chlorpheniramine-HYDROcodone  5 mL Oral Q12H  . enoxaparin (LOVENOX) injection  30 mg Subcutaneous Q24H  . feeding supplement (ENSURE ENLIVE)  237 mL Oral TID BM  . fluticasone  2 spray Each Nare Daily  . Influenza vac split quadrivalent PF  0.5 mL Intramuscular Tomorrow-1000  . levalbuterol  0.63 mg Nebulization TID  . pantoprazole  40 mg Oral Q breakfast  . polyethylene glycol  17 g Oral Daily  . predniSONE  40 mg Oral QAC breakfast  . senna  2 tablet Oral QHS  . tamsulosin  0.4 mg Oral QPC supper  . vancomycin  1,000 mg Intravenous Q24H   Continuous Infusions: . sodium chloride 50 mL/hr at  07/17/15 2081    Principal Problem:   HCAP (healthcare-associated pneumonia) Active Problems:   COPD, very severe ([place])   TOBACCO ABUSE-HISTORY OF   Urinary retention   Chronic respiratory failure with hypoxia (H[facility])   Protein-calorie malnutrition, severe (H[facility])   Adenocarcinoma of lung, stage 2 (HCC)   BPH (benign prostatic hyperplasia)   Malignant neoplasm of upper lobe of right lung (HCC)   FTT (failure to thrive) in adult   Pedal edema   Radiation pneumonitis ([place])    Time spent: 74 minutes    Athalene Kolle M.D. [facility] Pager [phone]. If 7PM-7AM, please contact night-coverage at [email or web address], password [facility] 07/17/2015, 9:16 AM  LOS: 2 days

## 2015-07-18 DIAGNOSIS — C3411 Malignant neoplasm of upper lobe, right bronchus or lung: Secondary | ICD-10-CM

## 2015-07-18 LAB — CBC
HEMATOCRIT: 26.8 % — AB (ref 39.0–52.0)
Hemoglobin: 8.7 g/dL — ABNORMAL LOW (ref 13.0–17.0)
MCH: 28.7 pg (ref 26.0–34.0)
MCHC: 32.5 g/dL (ref 30.0–36.0)
MCV: 88.4 fL (ref 78.0–100.0)
Platelets: 189 10*3/uL (ref 150–400)
RBC: 3.03 MIL/uL — ABNORMAL LOW (ref 4.22–5.81)
RDW: 18.6 % — AB (ref 11.5–15.5)
WBC: 10.1 10*3/uL (ref 4.0–10.5)

## 2015-07-18 LAB — BASIC METABOLIC PANEL
ANION GAP: 6 (ref 5–15)
BUN: 17 mg/dL (ref 6–20)
CALCIUM: 8.3 mg/dL — AB (ref 8.9–10.3)
CO2: 28 mmol/L (ref 22–32)
CREATININE: 0.82 mg/dL (ref 0.61–1.24)
Chloride: 104 mmol/L (ref 101–111)
Glucose, Bld: 109 mg/dL — ABNORMAL HIGH (ref 65–99)
Potassium: 4.4 mmol/L (ref 3.5–5.1)
SODIUM: 138 mmol/L (ref 135–145)

## 2015-07-18 LAB — PHOSPHORUS: PHOSPHORUS: 1.4 mg/dL — AB (ref 2.5–4.6)

## 2015-07-18 MED ORDER — POTASSIUM PHOSPHATES 15 MMOLE/5ML IV SOLN
30.0000 mmol | Freq: Once | INTRAVENOUS | Status: AC
  Administered 2015-07-18: 30 mmol via INTRAVENOUS
  Filled 2015-07-18: qty 10

## 2015-07-18 NOTE — Progress Notes (Signed)
Occupational Therapy Treatment Patient Details Name: Frank Garcia MRN: [id number] DOB: 03-16-1929 Today's Date: 07/18/2015    History of present illness 81 yo male admitted with Pna. Hx of COPD-O2 dep 3L, asthma, HTN, recent diagnosis of non small cell carcinoma.    OT comments  Patient reported shortness of breath today and declined OOB/EOB activities. Began education today regarding energy conservation techniques and handout provided to patient. OT will continue to follow per plan of care.  Follow Up Recommendations  Home health OT;Supervision/Assistance - 24 hour    Equipment Recommendations  3 in 1 bedside comode    Recommendations for Other Services PT consult    Precautions / Restrictions Precautions Precautions: Fall Precaution Comments: O2 dep       Mobility Bed Mobility                  Transfers                      Balance                                   ADL Overall ADL's : Needs assistance/impaired                                       General ADL Comments: Patient received in bed with HOB raised almost to 90 degree angle. Reports "short of breath" and states it's a "bad time." Declines OOB/EOB activities at this time. Patient noted to get SOB even when talking to therapist. Writer provided patient with energy conservation techniques handout. Began education on energy conservation techniques with patient. He verbalized understanding. Will continue to educate and review energy conservation techniques during OT sessions. Reviewed OT plan of care with patient; patient agreeable to attempting ADL/OOB activities at subsequent sessions. OT will follow.      Vision                     Perception     Praxis      Cognition   Behavior During Therapy: WFL for tasks assessed/performed Overall Cognitive Status: Within Functional Limits for tasks assessed                       Extremity/Trunk  Assessment               Exercises     Shoulder Instructions       General Comments      Pertinent Vitals/ Pain       Pain Assessment: No/denies pain  Home Living                                          Prior Functioning/Environment              Frequency Min 2X/week     Progress Toward Goals  OT Goals(current goals can now be found in the care plan section)  Progress towards OT goals: OT to reassess next treatment  Acute Rehab OT Goals Patient Stated Goal: to be able to breathe better  Plan Discharge plan remains appropriate    Co-evaluation  End of Session Equipment Utilized During Treatment: Oxygen   Activity Tolerance Patient limited by fatigue   Patient Left in bed;with call bell/phone within reach;with bed alarm set   Nurse Communication          Time: 1130-1145 OT Time Calculation (min): 15 min  Charges: OT General Charges $OT Visit: 1 Procedure OT Treatments $Self Care/Home Management : 8-22 mins  Lorielle Boehning A 07/18/2015, 11:55 AM

## 2015-07-18 NOTE — Progress Notes (Signed)
[facility] PROGRESS NOTE  MAMORU TAKESHITA ZOX:[id number] DOB: 09/22/1928 DOA: 07/15/2015 PCP: Brand Males, MD  Assessment/Plan: #1 healthcare associated pneumonia Per chest x-ray. Patient also noted to her presenting with worsening weakness. Patient currently afebrile. Patient has been pancultured and cultures are pending. Continue empiric IV Fortaz. D/C IV vancomycin. Transition to oral levaquin tomorrow.  #2 anemia of chronic disease Patient with no overt bleeding. Follow H&H.  #3 failure to thrive Patient is very emaciated, cachectic, with failure to thrive. Patient with lung cancer. Patient now with multiple hospitalizations with 6 admissions over the past 3 months. Patient with a poor prognosis. Palliative care have been consulted and in process of meeting with family.  4 history of severe COPD/chronic respiratory failure on home O2 3 L nasal cannula Stable. Continue nebulizers.  #5 BPH/urinary retention Flomax.  #6 pedal edema Likely secondary to hypoalbuminemia. Pre-albumin levels are 17.0. Patient on ensure. Patient also on Megace.  #7 stage II adenocarcinoma of the lung. Oncology has been notified via [place].  #8 prophylaxis Lovenox for DVT prophylaxis.  Code Status: Full Family Communication: Updated patient. No family at bedside. Disposition Plan: Home when medically stable with clinical improvement tomorrow.   Consultants:  Palliative care: Dr Hilma Favors 07/16/2015  Procedures:  Chest x-ray 07/15/2015  Antibiotics:  IV vancomycin 07/15/2015 >>>>07/18/2015  IV Tressie Ellis 07/15/2015  HPI/Subjective: Patient states feeling a little better. Patient with no SOB. No CP. Good PO intake.  Objective: Filed Vitals:   07/18/15 1416 07/18/15 1434  BP: 126/66 102/56  Pulse: 109   Temp: 98.3 F (36.8 C)   Resp: 18     Intake/Output Summary (Last 24 hours) at 07/18/15 1706 Last data filed at 07/18/15 1300  Gross per 24 hour  Intake   1160 ml  Output    1075 ml  Net     85 ml   Filed Weights   07/15/15 1416 07/15/15 1840  Weight: 41.731 kg (92 lb) 41.7 kg (91 lb 14.9 oz)    Exam:   General:  Frail. Cachectic.  Cardiovascular: Regular rate rhythm no murmurs rubs or gallops.  Respiratory: Clear to auscultation bilaterally. Decreased breath sounds diffusely.Fair air movement.  Abdomen: Soft, nontender, nondistended, positive bowels as.  Musculoskeletal: No clubbing cyanosis. Pedal edema.  Data Reviewed: Basic Metabolic Panel:  Recent Labs Lab 07/13/15 0804 07/15/15 1626 07/16/15 0507 07/17/15 0537 07/18/15 0532  NA 141 136 137 137 138  K 4.3 4.0 4.4 4.3 4.4  CL 100 96* 104 102 104  CO2 30  --  '27 27 28  '$ GLUCOSE 100* 73 88 110* 109*  BUN 23 22* '20 17 17  '$ CREATININE 0.84 1.00 0.80 0.74 0.82  CALCIUM 9.6  --  8.3* 8.3* 8.3*  MG 2.2  --  1.7  --   --   PHOS 3.1  --  2.3*  --  1.4*   Liver Function Tests:  Recent Labs Lab 07/13/15 0804 07/16/15 0507  AST 16 24  ALT 16 18  ALKPHOS 71 52  BILITOT 1.1 1.2  PROT 7.4 5.9*  ALBUMIN 3.3* 2.2*   No results for input(s): LIPASE, AMYLASE in the last 168 hours. No results for input(s): AMMONIA in the last 168 hours. CBC:  Recent Labs Lab 07/13/15 0804 07/15/15 1617 07/15/15 1626 07/16/15 0507 07/17/15 0537 07/18/15 0532  WBC 18.0* 13.7*  --  8.8 10.4 10.1  NEUTROABS 16.9* 12.7*  --  8.1*  --   --   HGB 11.6* 9.9* 12.9* 9.9* 8.8* 8.7*  HCT 36.6* 30.4* 38.0* 30.2* 27.1* 26.8*  MCV 89.2 88.1  --  87.0 88.3 88.4  PLT 367.0 Result may be falsely decreased due to platelet clumping. 269  --  177 189 189   Cardiac Enzymes: No results for input(s): CKTOTAL, CKMB, CKMBINDEX, TROPONINI in the last 168 hours. BNP (last 3 results)  Recent Labs  08/26/14 1527 04/11/15 0011 06/07/15 1755  BNP 46.7 29.7 56.7    ProBNP (last 3 results) No results for input(s): PROBNP in the last 8760 hours.  CBG: No results for input(s): GLUCAP in the last 168 hours.  Recent  Results (from the past 240 hour(s))  Urine culture     Status: None   Collection Time: 07/14/15 12:48 PM  Result Value Ref Range Status   Colony Count NO GROWTH  Final   Organism ID, Bacteria NO GROWTH  Final  Culture, blood (routine x 2)     Status: None (Preliminary result)   Collection Time: 07/15/15  5:30 PM  Result Value Ref Range Status   Specimen Description BLOOD LEFT ANTECUBITAL  Final   Special Requests BOTTLES DRAWN AEROBIC AND ANAEROBIC 5 CC  Final   Culture   Final    NO GROWTH 3 DAYS Performed at [facility]    Report Status PENDING  Incomplete  Culture, blood (routine x 2)     Status: None (Preliminary result)   Collection Time: 07/15/15  5:35 PM  Result Value Ref Range Status   Specimen Description BLOOD BLOOD RIGHT FOREARM  Final   Special Requests BOTTLES DRAWN AEROBIC AND ANAEROBIC 5 CC  Final   Culture   Final    NO GROWTH 3 DAYS Performed at [facility]    Report Status PENDING  Incomplete  Culture, sputum-assessment     Status: None   Collection Time: 07/16/15  3:07 AM  Result Value Ref Range Status   Specimen Description SPUTUM  Final   Special Requests NONE  Final   Sputum evaluation   Final    THIS SPECIMEN IS ACCEPTABLE. RESPIRATORY CULTURE REPORT TO FOLLOW.   Report Status 07/16/2015 FINAL  Final  Urine culture     Status: None   Collection Time: 07/16/15  3:07 AM  Result Value Ref Range Status   Specimen Description URINE, CLEAN CATCH  Final   Special Requests NONE  Final   Culture   Final    NO GROWTH 1 DAY Performed at [facility]    Report Status 07/17/2015 FINAL  Final  Culture, respiratory (NON-Expectorated)     Status: None (Preliminary result)   Collection Time: 07/16/15  3:07 AM  Result Value Ref Range Status   Specimen Description SPUTUM  Final   Special Requests NONE  Final   Gram Stain PENDING  Incomplete   Culture   Final    NORMAL OROPHARYNGEAL FLORA Performed at [facility]    Report  Status PENDING  Incomplete     Studies: No results found.  Scheduled Meds: . budesonide (PULMICORT) nebulizer solution  0.5 mg Nebulization BID  . cefTAZidime (FORTAZ)  IV  2 g Intravenous Q12H  . chlorpheniramine-HYDROcodone  5 mL Oral Q12H  . enoxaparin (LOVENOX) injection  30 mg Subcutaneous Q24H  . feeding supplement (ENSURE ENLIVE)  237 mL Oral TID BM  . fluticasone  2 spray Each Nare Daily  . Influenza vac split quadrivalent PF  0.5 mL Intramuscular Tomorrow-1000  . levalbuterol  0.63 mg Nebulization TID  . pantoprazole  40 mg Oral Q breakfast  . polyethylene glycol  17 g Oral Daily  . senna  2 tablet Oral QHS  . tamsulosin  0.4 mg Oral QPC supper   Continuous Infusions: . sodium chloride 50 mL/hr at 07/17/15 1521    Principal Problem:   HCAP (healthcare-associated pneumonia) Active Problems:   COPD, very severe (HCC)   TOBACCO ABUSE-HISTORY OF   Urinary retention   Chronic respiratory failure with hypoxia (HCC)   Protein-calorie malnutrition, severe (HCC)   Adenocarcinoma of lung, stage 2 (HCC)   BPH (benign prostatic hyperplasia)   Malignant neoplasm of upper lobe of right lung (HCC)   FTT (failure to thrive) in adult   Pedal edema   Radiation pneumonitis ([place])    Time spent: 16 minutes    THOMPSON,DANIEL M.D. [facility] Pager [phone]. If 7PM-7AM, please contact night-coverage at [email or web address], password [facility] 07/18/2015, 5:06 PM  LOS: 3 days

## 2015-07-18 NOTE — Care Management Note (Addendum)
Case Management Note  Patient Details  Name: Frank Garcia MRN: [id number] Date of Birth: 1929-06-12  Subjective/Objective:                  weakness  Action/Plan: CM spoke with patient at the bedside. Patient presented with the list of [facility] providers. He is unsure who he would like to use. He will review the list with his daughter today and call CM with his [place] choice. Patient lives in a home with his daughter.   4:10 pm - CM spoke with patient via telephone. He has not decided on the [facility] provider.   Expected Discharge Date:    07/19/15              Expected Discharge Plan:  [place]  In-House Referral:     Discharge planning Services  CM Consult  Post Acute Care Choice:  Home Health Choice offered to:  Patient  DME Arranged:    DME Agency:     HH Arranged:    [place] Agency:     Status of Service:  In process, will continue to follow  Medicare Important Message Given:    Date Medicare IM Given:    Medicare IM give by:    Date Additional Medicare IM Given:    Additional Medicare Important Message give by:     If discussed at [place] of Stay Meetings, dates discussed:    Additional Comments:  Apolonio Schneiders, RN 07/18/2015, 9:30 AM

## 2015-07-19 ENCOUNTER — Other Ambulatory Visit: Payer: Self-pay | Admitting: Adult Health

## 2015-07-19 DIAGNOSIS — C3490 Malignant neoplasm of unspecified part of unspecified bronchus or lung: Secondary | ICD-10-CM

## 2015-07-19 DIAGNOSIS — R05 Cough: Secondary | ICD-10-CM

## 2015-07-19 DIAGNOSIS — Z515 Encounter for palliative care: Secondary | ICD-10-CM

## 2015-07-19 DIAGNOSIS — R059 Cough, unspecified: Secondary | ICD-10-CM | POA: Insufficient documentation

## 2015-07-19 DIAGNOSIS — J189 Pneumonia, unspecified organism: Principal | ICD-10-CM

## 2015-07-19 LAB — BASIC METABOLIC PANEL
ANION GAP: 9 (ref 5–15)
BUN: 13 mg/dL (ref 6–20)
CALCIUM: 8.5 mg/dL — AB (ref 8.9–10.3)
CO2: 29 mmol/L (ref 22–32)
Chloride: 102 mmol/L (ref 101–111)
Creatinine, Ser: 0.74 mg/dL (ref 0.61–1.24)
Glucose, Bld: 102 mg/dL — ABNORMAL HIGH (ref 65–99)
Potassium: 3.9 mmol/L (ref 3.5–5.1)
Sodium: 140 mmol/L (ref 135–145)

## 2015-07-19 LAB — CBC
HCT: 32 % — ABNORMAL LOW (ref 39.0–52.0)
HEMOGLOBIN: 10.3 g/dL — AB (ref 13.0–17.0)
MCH: 28.5 pg (ref 26.0–34.0)
MCHC: 32.2 g/dL (ref 30.0–36.0)
MCV: 88.4 fL (ref 78.0–100.0)
Platelets: 200 10*3/uL (ref 150–400)
RBC: 3.62 MIL/uL — AB (ref 4.22–5.81)
RDW: 18.6 % — ABNORMAL HIGH (ref 11.5–15.5)
WBC: 11.7 10*3/uL — ABNORMAL HIGH (ref 4.0–10.5)

## 2015-07-19 LAB — LEGIONELLA PNEUMOPHILA SEROGP 1 UR AG: L. PNEUMOPHILA SEROGP 1 UR AG: NEGATIVE

## 2015-07-19 LAB — MAGNESIUM: MAGNESIUM: 1.8 mg/dL (ref 1.7–2.4)

## 2015-07-19 LAB — CULTURE, RESPIRATORY W GRAM STAIN
Culture: NORMAL
Gram Stain: NONE SEEN

## 2015-07-19 LAB — PHOSPHORUS: PHOSPHORUS: 2 mg/dL — AB (ref 2.5–4.6)

## 2015-07-19 MED ORDER — LEVOFLOXACIN 750 MG PO TABS: 750.0000 mg | ORAL_TABLET | ORAL | Status: DC

## 2015-07-19 MED ORDER — ENSURE ENLIVE PO LIQD: 237.0000 mL | Freq: Three times a day (TID) | ORAL | Status: DC

## 2015-07-19 MED ORDER — PREDNISONE 20 MG PO TABS
20.0000 mg | ORAL_TABLET | Freq: Every day | ORAL | Status: DC
  Administered 2015-07-19: 20 mg via ORAL
  Filled 2015-07-19: qty 1

## 2015-07-19 MED ORDER — POTASSIUM PHOSPHATES 15 MMOLE/5ML IV SOLN
30.0000 mmol | Freq: Once | INTRAVENOUS | Status: AC
  Administered 2015-07-19: 30 mmol via INTRAVENOUS
  Filled 2015-07-19: qty 10

## 2015-07-19 MED ORDER — PREDNISONE 10 MG PO TABS: 10.0000 mg | ORAL_TABLET | Freq: Every day | ORAL | Status: DC

## 2015-07-19 MED ORDER — LEVALBUTEROL HCL 1.25 MG/3ML IN NEBU: 0.6300 mg | INHALATION_SOLUTION | Freq: Four times a day (QID) | RESPIRATORY_TRACT | Status: AC | PRN

## 2015-07-19 MED ORDER — GUAIFENESIN ER 600 MG PO TB12: 1200.0000 mg | ORAL_TABLET | Freq: Two times a day (BID) | ORAL | Status: AC

## 2015-07-19 MED ORDER — LEVOFLOXACIN 750 MG PO TABS
750.0000 mg | ORAL_TABLET | ORAL | Status: DC
  Administered 2015-07-19: 750 mg via ORAL
  Filled 2015-07-19: qty 1

## 2015-07-19 MED ORDER — POLYETHYLENE GLYCOL 3350 17 G PO PACK: 17.0000 g | PACK | Freq: Every day | ORAL | Status: AC | PRN

## 2015-07-19 NOTE — Progress Notes (Signed)
PT Cancellation Note  Patient Details Name: Frank Garcia SEES MRN: [id number] DOB: 11/18/1928   Cancelled Treatment:     Pt requested to rest before he D/C to home. Declined any OOB activity.     Nathanial Rancher 07/19/2015, 12:14 PM

## 2015-07-19 NOTE — Progress Notes (Signed)
PHARMACY NOTE -  [place] has been assisting with dosing of Levaquin for PNA. Dosage remains stable at 750 mg and need for further dosage adjustment appears unlikely at present.    Will sign off at this time.  Please reconsult if a change in clinical status warrants re-evaluation of dosage.  Dia Sitter, PharmD, BCPS 07/19/2015 10:00 AM

## 2015-07-19 NOTE — Progress Notes (Signed)
D/c'd to home w/ dtr via W/C

## 2015-07-19 NOTE — Evaluation (Signed)
Clinical/Bedside Swallow Evaluation Patient Details  Name: YOVAN LEEMAN MRN: [id number] Date of Birth: Jan 02, 1929  Today's Date: 07/19/2015 Time: SLP Start Time (ACUTE ONLY): 1150 SLP Stop Time (ACUTE ONLY): 1215 SLP Time Calculation (min) (ACUTE ONLY): 25 min  Past Medical History:  Past Medical History  Diagnosis Date  . BPH (benign prostatic hypertrophy)     takes Rapaflo daily  . Chronic airway obstruction, not elsewhere classified   . Asthma     uses Spiriva and Dulera daily  . Emphysema lung ([place])   . Chronic bronchitis ([place])   . Shortness of breath     Albuterol inhaler as needed;lying and exertion  . Pneumonia     "a few times" (08/26/2014)  . Cough     productive but without fever  . Arthritis   . Joint pain   . Cataracts, bilateral     immature  . History of shingles   . Cancer ([place])   . Hypertension 07/15/2015    Pt denies hx of HTN   Past Surgical History:  Past Surgical History  Procedure Laterality Date  . No past surgeries    . Video bronchoscopy with endobronchial navigation N/A 03/10/2015    Procedure: VIDEO BRONCHOSCOPY WITH ENDOBRONCHIAL NAVIGATION and PLACEMENT OF 4MM X 7MM FIDUCIAL MARKERS x3;  Surgeon: Collene Gobble, MD;  Location: [facility] OR;  Service: Thoracic;  Laterality: N/A;   HPI:  81 yo male [facility] with cough/dyspnea, diagnosed with HCAP. PMH + for lung cancer s/p chemoradiation in October 2016, tobacco use, COPD, radiation pneumonitis, shingles and pt is cachectc. Pt is on oxygen at home - 3Liters.  Swallow evaluation ordered.     Assessment / Plan / Recommendation Clinical Impression  Pt presents with functional oropharyngeal swallow ability with very limited po he would accept (thin liquids only).  Pt with no focal CN deficits - no s/s of aspiraiton with thin liquids via straw.  Pt reported "I just got laid down"   Pt endorses dysphagia to solids at times - sensing lodging in esophagus and requiring liquids to facilitate clearance.   He also endorsed dyspnea at times with po intake - he denies dysphagia to pills.    SLP provided education to pt/family to help mitigate dysphagia symptoms.     Aspiration Risk    mild   Diet Recommendation   soft solids/thin (due to dentition)   Medication Administration: Whole meds with liquid    Other  Recommendations Oral Care Recommendations: Oral care BID   Follow up Recommendations  None    Frequency and Duration   n/a         Swallow Study   General Date of Onset: 07/19/15 HPI: 81 yo male adm to to [facility] with cough/dyspnea, diagnosed with HCAP. PMH + for lung cancer s/p chemoradiation in October 2016, tobacco use, COPD, radiation pneumonitis, shingles and pt is cachectc. Pt is on oxygen at home - 3Liters.  Swallow evaluation ordered.   Type of Study: Bedside Swallow Evaluation Diet Prior to this Study: Regular;Thin liquids Temperature Spikes Noted: Yes Respiratory Status: Room air History of Recent Intubation: No Behavior/Cognition: Alert;Cooperative;Pleasant mood Oral Cavity Assessment: Within Functional Limits Oral Care Completed by SLP: Yes Oral Cavity - Dentition: Poor condition;Missing dentition (missing upper dentition - pt does not use dentures) Vision: Functional for self-feeding Self-Feeding Abilities: Able to feed self Patient Positioning: Upright in bed Baseline Vocal Quality: Normal Volitional Cough: Strong Volitional Swallow: Able to elicit    Oral/Motor/Sensory  Function Overall Oral Motor/Sensory Function: Generalized oral weakness (generalized weakness)   Ice Chips Ice chips: Not tested   Thin Liquid Thin Liquid: Within functional limits Presentation: Self Fed;Straw    Nectar Thick Nectar Thick Liquid: Not tested   Honey Thick Honey Thick Liquid: Not tested   Puree Puree: Not tested   Solid Solid: Not tested       Luanna Salk, [facility] SLP [phone]

## 2015-07-19 NOTE — Progress Notes (Signed)
Awaiting for K phos bag to finish per Dr Grandville Silos then ok to D/C

## 2015-07-19 NOTE — Progress Notes (Signed)
[facility] is providing the following services: Commode - Spoke with patient's daughter Kenney Houseman) and she stated that she was not sure if her father is going to need the commode.  Stated that she would give me a call tomorrow if they decide to purchase it.    If patient discharges after hours, please call [phone].   Linward Headland 07/19/2015, 3:55 PMT

## 2015-07-19 NOTE — Discharge Summary (Signed)
Physician Discharge Summary  Frank Garcia QIO:[id number] DOB: [birth year] DOA: 07/15/2015  PCP: Brand Males, MD  Admit date: 07/15/2015 Discharge date: 07/19/2015  Time spent: 65 minutes  Recommendations for Outpatient Follow-up:  1. Follow-up with RAMASWAMY,MURALI, MD in 2 weeks for follow-up on healthcare acquired pneumonia. 2. Patient will be discharged home with a referral for home-based palliative care to follow-up at home for goals of care.   Discharge Diagnoses:  Principal Problem:   HCAP (healthcare-associated pneumonia) Active Problems:   COPD, very severe ([place])   TOBACCO ABUSE-HISTORY OF   Urinary retention   Chronic respiratory failure with hypoxia (HCC)   Protein-calorie malnutrition, severe (HCC)   Adenocarcinoma of lung, stage 2 (HCC)   BPH (benign prostatic hyperplasia)   Malignant neoplasm of upper lobe of right lung (HCC)   FTT (failure to thrive) in adult   Pedal edema   Radiation pneumonitis ([place])   Discharge Condition: Stable and improved  Diet recommendation: Regular.  Filed Weights   07/15/15 1416 07/15/15 1840  Weight: 41.731 kg (92 lb) 41.7 kg (91 lb 14.9 oz)    History of present illness:  Frank Garcia is a 81 y.o. male  With history of stage II a adenocarcinoma of the lung who recently completed course of chemotherapy and radiation in October, severe COPD on chronic home O2 at 3 L nasal cannula, recent admission in October for a healthcare associated pneumonia and a COPD flare at which point in time patient was treated with IV antibiotics, steroids and nebulizers. Patient was seen at his pulmonary doctors office 1 day prior to admission when he had complaints of generalized fatigue and feeling very weak, tiredness all the time, cough productive of yellowish sputum. Patient stated chest x-ray was done and he was called and diagnosed with a pneumonia and started on a course of Levaquin and a steroid taper. Patient denied any change in his  chronic shortness of breath, no fever, no chills, no nausea, no vomiting, no abdominal pain, no palpitations, no diarrhea, no melena, no hematemesis, no hematochezia, no change in intermittent chest pain. Patient did endorse constipation. Patient presented to the ED due to no significant improvement in his symptoms of ongoing weakness. Chest x-ray was not repeated. CBC obtained had a white count of 13.7 hemoglobin of 9.9 otherwise within normal limits. I-STAT 8 was obtained with a BUN of 22 chloride of 96 otherwise was within normal limits. Point-of-care troponin was negative. EKG showed sinus tachycardia with left axis deviation. Patient was given IV vancomycin and IV Fortaz. [facility] were called to admit the patient for further evaluation and management.    Hospital Course:  #1 healthcare associated pneumonia Per chest x-ray. Patient also noted to be presenting with worsening weakness. Patient remained afebrile. Patient was pancultured with no growth today. Patient was placed empirically on IV vancomycin and IV Fortaz as well as nebulizer treatments. Patient improved clinically. Patient will be discharged home on 4 more days of oral antibiotics complete a one-week course of antibiotic treatment. Patient will follow-up with PCP as outpatient.   #2 anemia of chronic disease Patient with no overt bleeding. Hemoglobin remained stable.   #3 failure to thrive Patient is very emaciated, cachectic, with failure to thrive. Patient with lung cancer. Patient now with multiple hospitalizations with 6 admissions over the past 3 months. Patient with a poor prognosis. Palliative care have been consulted and in process of meeting with family prior to discharge. A referral will be made for home-based palliative to  follow.  4 history of severe COPD/chronic respiratory failure on home O2 3 L nasal cannula Stable. Continued on nebulizers.  #5 BPH/urinary retention Flomax.  #6 pedal edema Likely  secondary to hypoalbuminemia. Pre-albumin levels are 17.0. Patient on ensure. Patient also on Megace.  #7 stage II adenocarcinoma of the lung. Oncology has been notified via [place].Outpatient follow up.   Procedures:  Chest x-ray 07/15/2015  Consultations:  Palliative care: Dr Hilma Favors 07/16/2015/ Dr Rowe Pavy 07/19/2015    Discharge Exam: Filed Vitals:   07/18/15 2123 07/19/15 0527  BP: 110/65 118/57  Pulse: 108 107  Temp: 98.2 F (36.8 C) 98.1 F (36.7 C)  Resp: 18 18    General: NAD Cardiovascular: RRR Respiratory: CTAB  Discharge Instructions   Discharge Instructions    Diet general    Complete by:  As directed      Discharge instructions    Complete by:  As directed   Follow up with Dr Chase Caller in 2 weeks.     Increase activity slowly    Complete by:  As directed           Current Discharge Medication List    START taking these medications   Details  !! feeding supplement, ENSURE ENLIVE, (ENSURE ENLIVE) LIQD Take 237 mLs by mouth 3 (three) times daily between meals. Qty: 237 mL, Refills: 12    levalbuterol (XOPENEX) 1.25 MG/3ML nebulizer solution Take 0.63 mg by nebulization every 6 (six) hours as needed for wheezing. Use 3 times daily x 4 days, then every 6 hours as needed. Qty: 72 mL, Refills: 4     !! - Potential duplicate medications found. Please discuss with provider.    CONTINUE these medications which have CHANGED   Details  guaiFENesin (MUCINEX) 600 MG 12 hr tablet Take 2 tablets (1,200 mg total) by mouth 2 (two) times daily. Take 2 tablets 2 times daily x 4 days then use as needed. Qty: 30 tablet, Refills: 0    levofloxacin (LEVAQUIN) 750 MG tablet Take 1 tablet (750 mg total) by mouth every other day. Take for 4 days then stop. Qty: 2 tablet, Refills: 0    polyethylene glycol (MIRALAX / GLYCOLAX) packet Take 17 g by mouth daily as needed for mild constipation or moderate constipation. Hold for diarrhea Qty: 28 each, Refills: 0     predniSONE (DELTASONE) 10 MG tablet Take 1-3 tablets (10-30 mg total) by mouth daily before breakfast. Take 3 tablets ('30mg'$ ) daily x 3 days, then 2 tablets ('20mg'$ ) daily x 3 days, then 1 tablet ('10mg'$ ) daily x 3 days then stop. Qty: 18 tablet, Refills: 0      CONTINUE these medications which have NOT CHANGED   Details  albuterol (PROVENTIL HFA;VENTOLIN HFA) 108 (90 BASE) MCG/ACT inhaler Inhale 2 puffs into the lungs every 4 (four) hours as needed for wheezing or shortness of breath. Qty: 1 Inhaler, Refills: 3    albuterol (PROVENTIL) (2.5 MG/3ML) 0.083% nebulizer solution USE 1 VIAL PER NEBULIZER EVERY 2 HOURS AS NEEDED FOR WHEEZING AND SHORTNESS OF BREATH Qty: 600 mL, Refills: 0    fluticasone (FLONASE) 50 MCG/ACT nasal spray Place 2 sprays into both nostrils daily. Qty: 16 g, Refills: 5    megestrol (MEGACE) 40 MG/ML suspension Take 5 mLs (200 mg total) by mouth 2 (two) times daily. Qty: 240 mL, Refills: 0    mometasone-formoterol (DULERA) 200-5 MCG/ACT AERO Inhale 2 puffs into the lungs 2 (two) times daily. Qty: 2 Inhaler, Refills: 0    !!  Nutritional Supplements (ENSURE HIGH PROTEIN) LIQD Take 1 Bottle by mouth 3 (three) times daily. To be taken 1 bottle three times a day between meals; not as meal substitution.Otho Darner: 79892 mL, Refills: 3    pantoprazole (PROTONIX) 40 MG tablet Take 1 tablet (40 mg total) by mouth daily at 12 noon. Qty: 60 tablet, Refills: 3    RAPAFLO 8 MG CAPS capsule Take 1 capsule by mouth daily.    Tiotropium Bromide Monohydrate (SPIRIVA RESPIMAT) 2.5 MCG/ACT AERS Inhale 2 puffs into the lungs daily. Qty: 3 Inhaler, Refills: 1    zolpidem (AMBIEN) 5 MG tablet Take 1 tablet (5 mg total) by mouth at bedtime as needed for sleep. Qty: 30 tablet, Refills: 0    HYDROcodone-acetaminophen (HYCET) 7.5-325 mg/15 ml solution Take 10 mLs by mouth 4 (four) times daily as needed for moderate pain. Qty: 473 mL, Refills: 0    Magnesium Hydroxide (MILK OF MAGNESIA PO)  Take 1 Dose by mouth daily as needed (constipation).    prochlorperazine (COMPAZINE) 10 MG tablet Take 1 tablet (10 mg total) by mouth every 6 (six) hours as needed for nausea or vomiting. Qty: 30 tablet, Refills: 0     !! - Potential duplicate medications found. Please discuss with provider.    STOP taking these medications     amoxicillin-clavulanate (AUGMENTIN) 875-125 MG tablet      HYDROcodone-homatropine (HYDROMET) 5-1.5 MG/5ML syrup      sucralfate (CARAFATE) 1 G tablet        No Known Allergies Follow-up Information    Follow up with RAMASWAMY,MURALI, MD. Schedule an appointment as soon as possible for a visit in 2 weeks.   Specialty:  Pulmonary Disease   Contact information:   [place] [address] [phone]        The results of significant diagnostics from this hospitalization (including imaging, microbiology, ancillary and laboratory) are listed below for reference.    Significant Diagnostic Studies: Dg Chest 2 View  07/15/2015  CLINICAL DATA:  Right upper lobe nodule - persistent cough- hx COPD - hx asthma - hx emphysema - former smoker EXAM: CHEST - 2 VIEW COMPARISON:  07/14/2015 FINDINGS: 6.3 cm focal consolidation in the posterior right upper lobe, progressive since previous exam. Fiducial markers project anterior to the process. Left lung remains clear. Heart size normal. Persistent right middle lobe atelectasis/consolidation. No effusion. No pneumothorax. Visualized skeletal structures are unremarkable. IMPRESSION: 1. Progressive posterior right upper lobe  consolidation. Electronically Signed   By: Lucrezia Europe M.D.   On: 07/15/2015 19:33   Dg Chest 2 View  07/14/2015  CLINICAL DATA:  Follow-up right lung infiltrate with fatigue, cough, shortness of breath. EXAM: CHEST  2 VIEW COMPARISON:  June 29, 2015 FINDINGS: The heart size and mediastinal contours are within normal limits. There is patchy consolidation of the right upper lobe slightly  worse compared to prior exam. The lungs are hyperinflated. There is no pleural effusion. The visualized skeletal structures are unremarkable. IMPRESSION: Right upper lobe consolidation/ pneumonia slightly worse compared to prior exam. Emphysema. Electronically Signed   By: Abelardo Diesel M.D.   On: 07/14/2015 15:28   Dg Chest 2 View  06/29/2015  CLINICAL DATA:  Follow-up of pneumonia EXAM: CHEST  2 VIEW COMPARISON:  Portable chest x-ray of June 18, 2015 and PA and lateral chest x-ray of June 07, 2015. FINDINGS: Patchy interstitial infiltrate in the inferior aspect of the right upper lobe has become more conspicuous. There is subsegmental atelectasis  in the posterior right perihilar region that is more conspicuous. The left lung exhibits very minimal increased interstitial density in the upper and lower lobes as compared to the previous study. There is no pleural effusion or pneumothorax. The mediastinum is normal in width. The heart is not enlarged. The pulmonary vascularity is not engorged. There are surgical clips in the left upper lobe which appears stable. IMPRESSION: Interval worsening of interstitial infiltrate in the inferior aspect of the right upper lobe. The findings are similar to those originally demonstrated on June 07, 2015. This is suspicious for recurrent pneumonia. There is also minimal increased interstitial density in the left lung as compared to the previous study. Electronically Signed   By: David  Martinique M.D.   On: 06/29/2015 14:49   Ct Chest Wo Contrast  07/01/2015  CLINICAL DATA:  Recent pneumonia. Hospitalized for 3 days. Discharge from the prior. Decreased appetite. Shortness of breath. History RIGHT upper lobe lung cancer. EXAM: CT CHEST WITHOUT CONTRAST TECHNIQUE: Multidetector CT imaging of the chest was performed following the standard protocol without IV contrast. COMPARISON:  PET-CT 04/16/2015, chest radiograph 06/29/2015 FINDINGS: Mediastinum/Nodes: No axillary spur  particular lymphadenopathy. No mediastinal hilar adenopathy no pericardial esophagus normal. Lungs/Pleura: There is improvement in the lingular airspace disease seen on comparison CT from 04/15/2005. There is increased consolidation RIGHT upper lobe superimposed on central lobular emphysema (image 28, series 3). The send RIGHT middle lobe nodule which was hypermetabolic on comparison CT is no longer evident. (Image 20, series 3). Within the superior segment of the LEFT lower lobe there is a new 7 mm irregular nodule image 22, series 3. Upper abdomen: Low-density lesion in the LEFT hepatic lobe cysts benign cysts. Adrenal glands. No aggressive osseous lesion Musculoskeletal: No aggressive osseous lesion IMPRESSION: 1. No evidence of acute pneumonia. 2. Increased consolidation superimposed on centrilobular emphysema in the RIGHT upper lobe suggests radiation effect. 3. Positive treatment response of RIGHT upper lobe nodule which is no longer measurable. 4. Improvement in the lingular pneumonia seen on comparison CT. 5. Extensive centrilobular emphysema. 6. New 7 mm pulmonary nodule in the superior segment of the LEFT lower lobe likely represents infection or inflammation. Recommend close attention on follow-up. Electronically Signed   By: Suzy Bouchard M.D.   On: 07/01/2015 16:38    Microbiology: Recent Results (from the past 240 hour(s))  Urine culture     Status: None   Collection Time: 07/14/15 12:48 PM  Result Value Ref Range Status   Colony Count NO GROWTH  Final   Organism ID, Bacteria NO GROWTH  Final  Culture, blood (routine x 2)     Status: None (Preliminary result)   Collection Time: 07/15/15  5:30 PM  Result Value Ref Range Status   Specimen Description BLOOD LEFT ANTECUBITAL  Final   Special Requests BOTTLES DRAWN AEROBIC AND ANAEROBIC 5 CC  Final   Culture   Final    NO GROWTH 3 DAYS Performed at [facility]    Report Status PENDING  Incomplete  Culture, blood (routine x 2)      Status: None (Preliminary result)   Collection Time: 07/15/15  5:35 PM  Result Value Ref Range Status   Specimen Description BLOOD BLOOD RIGHT FOREARM  Final   Special Requests BOTTLES DRAWN AEROBIC AND ANAEROBIC 5 CC  Final   Culture   Final    NO GROWTH 3 DAYS Performed at [facility]    Report Status PENDING  Incomplete  Culture, sputum-assessment  Status: None   Collection Time: 07/16/15  3:07 AM  Result Value Ref Range Status   Specimen Description SPUTUM  Final   Special Requests NONE  Final   Sputum evaluation   Final    THIS SPECIMEN IS ACCEPTABLE. RESPIRATORY CULTURE REPORT TO FOLLOW.   Report Status 07/16/2015 FINAL  Final  Urine culture     Status: None   Collection Time: 07/16/15  3:07 AM  Result Value Ref Range Status   Specimen Description URINE, CLEAN CATCH  Final   Special Requests NONE  Final   Culture   Final    NO GROWTH 1 DAY Performed at [facility]    Report Status 07/17/2015 FINAL  Final  Culture, respiratory (NON-Expectorated)     Status: None (Preliminary result)   Collection Time: 07/16/15  3:07 AM  Result Value Ref Range Status   Specimen Description SPUTUM  Final   Special Requests NONE  Final   Gram Stain   Final    NO WBC SEEN NO SQUAMOUS EPITHELIAL CELLS SEEN NO ORGANISMS SEEN Performed at [facility]    Culture   Final    NORMAL OROPHARYNGEAL FLORA Performed at [facility]    Report Status PENDING  Incomplete     Labs: Basic Metabolic Panel:  Recent Labs Lab 07/13/15 0804 07/15/15 1626 07/16/15 0507 07/17/15 0537 07/18/15 0532 07/19/15 0800  NA 141 136 137 137 138 140  K 4.3 4.0 4.4 4.3 4.4 3.9  CL 100 96* 104 102 104 102  CO2 30  --  '27 27 28 29  '$ GLUCOSE 100* 73 88 110* 109* 102*  BUN 23 22* '20 17 17 13  '$ CREATININE 0.84 1.00 0.80 0.74 0.82 0.74  CALCIUM 9.6  --  8.3* 8.3* 8.3* 8.5*  MG 2.2  --  1.7  --   --  1.8  PHOS 3.1  --  2.3*  --  1.4* 2.0*   Liver Function  Tests:  Recent Labs Lab 07/13/15 0804 07/16/15 0507  AST 16 24  ALT 16 18  ALKPHOS 71 52  BILITOT 1.1 1.2  PROT 7.4 5.9*  ALBUMIN 3.3* 2.2*   No results for input(s): LIPASE, AMYLASE in the last 168 hours. No results for input(s): AMMONIA in the last 168 hours. CBC:  Recent Labs Lab 07/13/15 0804 07/15/15 1617 07/15/15 1626 07/16/15 0507 07/17/15 0537 07/18/15 0532 07/19/15 0800  WBC 18.0* 13.7*  --  8.8 10.4 10.1 11.7*  NEUTROABS 16.9* 12.7*  --  8.1*  --   --   --   HGB 11.6* 9.9* 12.9* 9.9* 8.8* 8.7* 10.3*  HCT 36.6* 30.4* 38.0* 30.2* 27.1* 26.8* 32.0*  MCV 89.2 88.1  --  87.0 88.3 88.4 88.4  PLT 367.0 Result may be falsely decreased due to platelet clumping. 269  --  177 189 189 200   Cardiac Enzymes: No results for input(s): CKTOTAL, CKMB, CKMBINDEX, TROPONINI in the last 168 hours. BNP: BNP (last 3 results)  Recent Labs  08/26/14 1527 04/11/15 0011 06/07/15 1755  BNP 46.7 29.7 56.7    ProBNP (last 3 results) No results for input(s): PROBNP in the last 8760 hours.  CBG: No results for input(s): GLUCAP in the last 168 hours.     SignedIrine Seal  MD [facility] 07/19/2015, 10:31 AM

## 2015-07-19 NOTE — Progress Notes (Signed)
Daily Progress Note   Patient Name: Frank Garcia       Date: 07/19/2015 DOB: 04-21-1929  Age: 81 y.o. MRN#: [id number] Attending Physician: Eugenie Filler, MD Primary Care Physician: Brand Males, MD Admit Date: 07/15/2015  Reason for Consultation/Follow-up: Establishing goals of care  Subjective: Sitting by edge of bed, in no distress eager and ready for D/c Interval Events: Call placed and discussed with daughter Kenney Houseman Overall at [phone]: she is agreeable to home based pall care Discussed with CSW on the floor, she will fill out the referral form online from hosp[facility] and [facility] of [facility] for home based pall care.   Length of Stay: 4 days  Current Medications: Scheduled Meds:  . budesonide (PULMICORT) nebulizer solution  0.5 mg Nebulization BID  . chlorpheniramine-HYDROcodone  5 mL Oral Q12H  . enoxaparin (LOVENOX) injection  30 mg Subcutaneous Q24H  . feeding supplement (ENSURE ENLIVE)  237 mL Oral TID BM  . fluticasone  2 spray Each Nare Daily  . Influenza vac split quadrivalent PF  0.5 mL Intramuscular Tomorrow-1000  . levalbuterol  0.63 mg Nebulization TID  . levofloxacin  750 mg Oral Q48H  . pantoprazole  40 mg Oral Q breakfast  . polyethylene glycol  17 g Oral Daily  . potassium phosphate IVPB (mmol)  30 mmol Intravenous Once  . predniSONE  20 mg Oral QAC breakfast  . senna  2 tablet Oral QHS  . tamsulosin  0.4 mg Oral QPC supper    Continuous Infusions:    PRN Meds: acetaminophen, HYDROcodone-acetaminophen, ipratropium, levalbuterol, ondansetron (ZOFRAN) IV, sodium chloride, zolpidem  Physical Exam: Physical Exam             Weak thin elderly gentleman  NAD Diminished S1 S2 No edema Non focal  Vital Signs: BP 118/57 mmHg  Pulse 107   Temp(Src) 98.1 F (36.7 C) (Oral)  Resp 18  Ht '5\' 7"'$  (1.702 m)  Wt 41.7 kg (91 lb 14.9 oz)  BMI 14.40 kg/m2  SpO2 95% SpO2: SpO2: 95 % O2 Device: O2 Device: Not Delivered O2 Flow Rate: O2 Flow Rate (L/min): 3 L/min  Intake/output summary:  Intake/Output Summary (Last 24 hours) at 07/19/15 1103 Last data filed at 07/19/15 1504  Gross per 24 hour  Intake    220 ml  Output  1100 ml  Net   -880 ml   LBM: Last BM Date: 07/15/15 Baseline Weight: Weight: 41.731 kg (92 lb) Most recent weight: Weight: 41.7 kg (91 lb 14.9 oz)       Palliative Assessment/Data: Flowsheet Rows        Most Recent Value   Intake Tab    Referral Department  Hospitalist   Unit at Time of Referral  Cardiac/Telemetry Unit   Palliative Care Primary Diagnosis  Cancer   Date Notified  07/15/15   Palliative Care Type  New Palliative care   Reason for referral  Clarify Goals of Care   Date of Admission  07/15/15   Date first seen by Palliative Care  07/16/15   # of days Palliative referral response time  1 Day(s)   # of days IP prior to Palliative referral  0   Clinical Assessment    Palliative Performance Scale Score  40%   Pain Max last 24 hours  0   Pain Min Last 24 hours  0   Dyspnea Max Last 24 Hours  5   Dyspnea Min Last 24 hours  4   Nausea Max Last 24 Hours  Not able to report   Nausea Min Last 24 Hours  Not able to report   Anxiety Max Last 24 Hours  3   Anxiety Min Last 24 Hours  3   Other Max Last 24 Hours  Not able to report   Psychosocial & Spiritual Assessment    Palliative Care Outcomes    Patient/Family meeting held?  No   Palliative Care Outcomes  Clarified goals of care   Palliative Care follow-up planned  Yes, Home   Palliative Care Follow-up Reason  Non-pain symptom, Clarify goals of care, Advanced care planning      Additional Data Reviewed: CBC    Component Value Date/Time   WBC 11.7* 07/19/2015 0800   WBC 4.4 06/09/2015 1100   RBC 3.62* 07/19/2015 0800   RBC 3.51*  07/16/2015 0507   RBC 3.18* 06/09/2015 1100   HGB 10.3* 07/19/2015 0800   HGB 8.7* 06/09/2015 1100   HCT 32.0* 07/19/2015 0800   HCT 26.4* 06/09/2015 1100   PLT 200 07/19/2015 0800   PLT 181 06/09/2015 1100   MCV 88.4 07/19/2015 0800   MCV 83.2 06/09/2015 1100   MCH 28.5 07/19/2015 0800   MCH 27.3 06/09/2015 1100   MCHC 32.2 07/19/2015 0800   MCHC 32.8 06/09/2015 1100   RDW 18.6* 07/19/2015 0800   RDW 18.3* 06/09/2015 1100   LYMPHSABS 0.2* 07/16/2015 0507   LYMPHSABS 0.0* 06/09/2015 1100   MONOABS 0.5 07/16/2015 0507   MONOABS 0.2 06/09/2015 1100   EOSABS 0.0 07/16/2015 0507   EOSABS 0.0 06/09/2015 1100   BASOSABS 0.0 07/16/2015 0507   BASOSABS 0.0 06/09/2015 1100    CMP     Component Value Date/Time   NA 140 07/19/2015 0800   NA 142 06/09/2015 1100   K 3.9 07/19/2015 0800   K 4.3 06/09/2015 1100   CL 102 07/19/2015 0800   CO2 29 07/19/2015 0800   CO2 22 06/09/2015 1100   GLUCOSE 102* 07/19/2015 0800   GLUCOSE 128 06/09/2015 1100   BUN 13 07/19/2015 0800   BUN 31.8* 06/09/2015 1100   CREATININE 0.74 07/19/2015 0800   CREATININE 1.0 06/09/2015 1100   CALCIUM 8.5* 07/19/2015 0800   CALCIUM 9.0 06/09/2015 1100   PROT 5.9* 07/16/2015 0507   PROT 6.6 06/09/2015 1100   ALBUMIN 2.2*  07/16/2015 0507   ALBUMIN 2.8* 06/09/2015 1100   AST 24 07/16/2015 0507   AST 22 06/09/2015 1100   ALT 18 07/16/2015 0507   ALT 13 06/09/2015 1100   ALKPHOS 52 07/16/2015 0507   ALKPHOS 62 06/09/2015 1100   BILITOT 1.2 07/16/2015 0507   BILITOT 0.61 06/09/2015 1100   GFRNONAA >60 07/19/2015 0800   GFRAA >60 07/19/2015 0800       Problem List:  Patient Active Problem List   Diagnosis Date Noted  . Radiation pneumonitis ([place]) 07/16/2015  . FTT (failure to thrive) in adult 07/15/2015  . Pedal edema 07/15/2015  . HCAP (healthcare-associated pneumonia) 06/15/2015  . Malignant neoplasm of upper lobe of right lung ([place])   . BPH (benign prostatic hyperplasia) 04/11/2015  . Sepsis  ([place]) 04/11/2015  . Adenocarcinoma of lung, stage 2 ([place]) 03/23/2015  . Lung nodule, solitary 02/19/2015  . Postnasal drip 01/11/2015  . Acute on chronic respiratory failure with hypoxemia ([place]) 08/26/2014  . Protein-calorie malnutrition, severe ([place]) 01/01/2014  . Community acquired pneumonia 12/31/2013  . COPD exacerbation ([place]) 12/31/2013  . Chronic respiratory failure with hypoxia ([place]) 10/10/2013  . CAP (community acquired pneumonia) 09/24/2013  . Pneumonia 09/24/2013  . Orthopnea 09/03/2013  . Coronary atherosclerosis due to calcified coronary lesion(414.4) 06/27/2013  . Nodule of right lung 06/15/2013  . Sinus drainage 09/25/2012  . Urinary retention 09/25/2012  . Hoarseness of voice 01/28/2012  . Hoarseness 01/23/2012  . Syncope 11/27/2011  . Urinary hesitancy 11/27/2011  . Pulmonary nodules 02/08/2011  . Symptoms concerning nutrition, metabolism, and development 03/16/2009  . ORTHOSTATIC DIZZINESS 06/15/2008  . TOBACCO ABUSE-HISTORY OF 09/25/2007  . COPD, very severe ([place]) 09/24/2007     Palliative Care Assessment & Plan    1.Code Status:  Full code    Code Status Orders        Start     Ordered   07/15/15 1840  Full code   Continuous     07/15/15 1843       2. Goals of Care/Additional Recommendations:   home today, with home based pall care follow up  Limitations on Scope of Treatment: Full Scope Treatment  Desire for further Chaplaincy support:no  Psycho-social Needs: Caregiving  Support/Resources  3. Symptom Management:      1. As above  4. Palliative Prophylaxis:   Bowel Regimen  5. Prognosis: Unable to determine  6. Discharge Planning:  Home with [place] was discussed with  Patient, daughter, Dr Grandville Silos and csw  Thank you for allowing the Palliative Medicine Team to assist in the care of this patient.   Time In: 1030 Time Out: 1100 Total Time 30 Prolonged Time Billed  no        [phone] Loistine Chance, MD    07/19/2015, 11:03 AM  Please contact Palliative Medicine Team phone at [phone] for questions and concerns.

## 2015-07-20 LAB — CULTURE, BLOOD (ROUTINE X 2)
CULTURE: NO GROWTH
Culture: NO GROWTH

## 2015-07-20 NOTE — Progress Notes (Signed)
  Radiation Oncology         [phone] ________________________________  Name: Frank Laventure TatumMRN: [id number] Date: 10/19/2016DOB: 06-May-1929  End of Treatment Note  Diagnosis: Adenocarcinoma of lung, stage 2 ([place])  Staging form: Lung, AJCC 7th Edition  Clinical: Stage IIA (T1a, N1, M0) - Signed by Curt Bears, MD on 04/22/2015  Staging comments: Adenocarcinoma     Indication for treatment: Curative   Radiation treatment dates: 04/29/15 - 06/17/15  Site/dose: Right Lung treated to 60 Gy in 30 treatments  Beams/energy: 3D Conformal / 10,6X MV  Narrative: The patient tolerated radiation treatment relatively well. He was unfortunately hospitalized during his therapy for pneumonia and failure to thrive. He completed chemotherapy and radiation, however.   Plan: The patient has completed radiation treatment. The patient will return to radiation oncology clinic for routine followup in one month. I advised them to call or return sooner if they have any questions or concerns related to their recovery or treatment.  ------------------------------------------------  Thea Silversmith, MD

## 2015-07-21 ENCOUNTER — Ambulatory Visit (HOSPITAL_COMMUNITY): Admission: RE | Admit: 2015-07-21 | Payer: Medicare Other | Source: Ambulatory Visit

## 2015-07-21 ENCOUNTER — Other Ambulatory Visit: Payer: Medicare Other

## 2015-07-27 ENCOUNTER — Telehealth: Payer: Self-pay | Admitting: Internal Medicine

## 2015-07-27 NOTE — Telephone Encounter (Signed)
Called and spoke to pt's daughter, Kenney Houseman. Kenney Houseman stated the pt is SOB and thinks he needs more O2 than the 3 lpm. I asked Tonya if pt has a pulse/ox at home to measure his spO2, Kenney Houseman stated they do not have one but she will get one today. Rosamaria Lints I'd call [facility] to see how high the stationary concentrator can go Kenney Houseman and pt though concentrator only went up to 3lpm). Called and spoke to [facility] with [facility] and was advised the pt's concentrator will go up to 5lpm. Called and informed Tonya and informed her to check pt's spO2 and if he is desating on 3lpm then to call us back or if pt's SOB increases past his baseline or any other increase in s/s. Tonya verbalized understanding and denied any further questions or concerns at this time.

## 2015-07-28 ENCOUNTER — Ambulatory Visit (HOSPITAL_BASED_OUTPATIENT_CLINIC_OR_DEPARTMENT_OTHER): Payer: Medicare Other | Admitting: Internal Medicine

## 2015-07-28 ENCOUNTER — Encounter: Payer: Self-pay | Admitting: Internal Medicine

## 2015-07-28 ENCOUNTER — Telehealth: Payer: Self-pay | Admitting: Internal Medicine

## 2015-07-28 ENCOUNTER — Ambulatory Visit (HOSPITAL_BASED_OUTPATIENT_CLINIC_OR_DEPARTMENT_OTHER): Payer: Medicare Other

## 2015-07-28 VITALS — BP 86/48 | HR 109 | Temp 98.2°F | Resp 18 | Ht 67.0 in | Wt 94.1 lb

## 2015-07-28 DIAGNOSIS — C3490 Malignant neoplasm of unspecified part of unspecified bronchus or lung: Secondary | ICD-10-CM

## 2015-07-28 DIAGNOSIS — C3491 Malignant neoplasm of unspecified part of right bronchus or lung: Secondary | ICD-10-CM | POA: Diagnosis present

## 2015-07-28 DIAGNOSIS — Z23 Encounter for immunization: Secondary | ICD-10-CM | POA: Diagnosis not present

## 2015-07-28 MED ORDER — PNEUMOCOCCAL VAC POLYVALENT 25 MCG/0.5ML IJ INJ
0.5000 mL | INJECTION | Freq: Once | INTRAMUSCULAR | Status: AC
  Administered 2015-07-28: 0.5 mL via INTRAMUSCULAR
  Filled 2015-07-28: qty 0.5

## 2015-07-28 MED ORDER — PNEUMOCOCCAL 13-VAL CONJ VACC IM SUSP: 0.5000 mL | INTRAMUSCULAR | Status: DC

## 2015-07-28 MED ORDER — INFLUENZA VAC SPLIT QUAD 0.5 ML IM SUSY
0.5000 mL | PREFILLED_SYRINGE | Freq: Once | INTRAMUSCULAR | Status: AC
  Administered 2015-07-28: 0.5 mL via INTRAMUSCULAR
  Filled 2015-07-28: qty 0.5

## 2015-07-28 NOTE — Addendum Note (Signed)
Addended by: Lucile Crater on: 07/28/2015 09:56 AM   Modules accepted: Orders

## 2015-07-28 NOTE — Addendum Note (Signed)
Addended by: Tora Kindred on: 07/28/2015 10:10 AM   Modules accepted: Orders

## 2015-07-28 NOTE — Patient Instructions (Signed)

## 2015-07-28 NOTE — Progress Notes (Signed)
[facility] [phone]   Fax:(336) [place], [place] [address]  DIAGNOSIS: Unresectable a stage IIA (T1a, N1, M0) non-small cell lung cancer, adenocarcinoma diagnosed in August 2016.  PRIOR THERAPY: Concurrent chemoradiation with weekly carboplatin for AUC of 2 and paclitaxel 45 MG/M2. Status post 6 cycles last cycle was given on 06/01/2015 with partial response.  CURRENT THERAPY: Observation.  INTERVAL HISTORY: Frank Garcia 81 y.o. male returns to the clinic today for follow-up visit accompanied by his daughter. The patient was admitted a few times to [facility] with recurrent pneumonia and COPD exacerbation. He is feeling much better. He continues to have shortness breath and currently on home oxygen 5 L/minute. He had repeat CT scan of the chest during his hospitalization that showed partial response to the treatment. He denied having any significant fever or chills, he has no nausea or vomiting. The patient continues to have shortness breath and mild cough but no hemoptysis. He has no significant weight loss or night sweats. The patient is here today for evaluation and discussion of his treatment options.  MEDICAL HISTORY: Past Medical History  Diagnosis Date  . BPH (benign prostatic hypertrophy)     takes Rapaflo daily  . Chronic airway obstruction, not elsewhere classified   . Asthma     uses Spiriva and Dulera daily  . Emphysema lung ([place])   . Chronic bronchitis ([place])   . Shortness of breath     Albuterol inhaler as needed;lying and exertion  . Pneumonia     "a few times" (08/26/2014)  . Cough     productive but without fever  . Arthritis   . Joint pain   . Cataracts, bilateral     immature  . History of shingles   . Cancer ([place])   . Hypertension 07/15/2015    Pt denies hx of HTN    ALLERGIES:  has No Known Allergies.  MEDICATIONS:  Current Outpatient  Prescriptions  Medication Sig Dispense Refill  . albuterol (PROVENTIL HFA;VENTOLIN HFA) 108 (90 BASE) MCG/ACT inhaler Inhale 2 puffs into the lungs every 4 (four) hours as needed for wheezing or shortness of breath. 1 Inhaler 3  . albuterol (PROVENTIL) (2.5 MG/3ML) 0.083% nebulizer solution USE 1 VIAL PER NEBULIZER EVERY 2 HOURS AS NEEDED FOR WHEEZING AND SHORTNESS OF BREATH 600 mL 0  . feeding supplement, ENSURE ENLIVE, (ENSURE ENLIVE) LIQD Take 237 mLs by mouth 3 (three) times daily between meals. 237 mL 12  . fluticasone (FLONASE) 50 MCG/ACT nasal spray Place 2 sprays into both nostrils daily. (Patient taking differently: Place 2 sprays into both nostrils daily as needed for allergies. ) 16 g 5  . guaiFENesin (MUCINEX) 600 MG 12 hr tablet Take 2 tablets (1,200 mg total) by mouth 2 (two) times daily. Take 2 tablets 2 times daily x 4 days then use as needed. 30 tablet 0  . HYDROcodone-acetaminophen (HYCET) 7.5-325 mg/15 ml solution Take 10 mLs by mouth 4 (four) times daily as needed for moderate pain. (Patient not taking: Reported on 07/14/2015) 473 mL 0  . levalbuterol (XOPENEX) 1.25 MG/3ML nebulizer solution Take 0.63 mg by nebulization every 6 (six) hours as needed for wheezing. Use 3 times daily x 4 days, then every 6 hours as needed. 72 mL 4  . levofloxacin (LEVAQUIN) 750 MG tablet Take 1 tablet (750 mg total) by mouth every other day. Take for 4 days then  stop. 2 tablet 0  . Magnesium Hydroxide (MILK OF MAGNESIA PO) Take 1 Dose by mouth daily as needed (constipation).    . megestrol (MEGACE) 40 MG/ML suspension TAKE 5 MLS (200 MG TOTAL) BY MOUTH 2 (TWO) TIMES DAILY. 240 mL 0  . mometasone-formoterol (DULERA) 200-5 MCG/ACT AERO Inhale 2 puffs into the lungs 2 (two) times daily. 2 Inhaler 0  . Nutritional Supplements (ENSURE HIGH PROTEIN) LIQD Take 1 Bottle by mouth 3 (three) times daily. To be taken 1 bottle three times a day between meals; not as meal substitution.. 16109 mL 3  . pantoprazole  (PROTONIX) 40 MG tablet Take 1 tablet (40 mg total) by mouth daily at 12 noon. 60 tablet 3  . polyethylene glycol (MIRALAX / GLYCOLAX) packet Take 17 g by mouth daily as needed for mild constipation or moderate constipation. Hold for diarrhea 28 each 0  . predniSONE (DELTASONE) 10 MG tablet Take 1-3 tablets (10-30 mg total) by mouth daily before breakfast. Take 3 tablets ('30mg'$ ) daily x 3 days, then 2 tablets ('20mg'$ ) daily x 3 days, then 1 tablet ('10mg'$ ) daily x 3 days then stop. 18 tablet 0  . prochlorperazine (COMPAZINE) 10 MG tablet Take 1 tablet (10 mg total) by mouth every 6 (six) hours as needed for nausea or vomiting. (Patient not taking: Reported on 07/15/2015) 30 tablet 0  . RAPAFLO 8 MG CAPS capsule Take 1 capsule by mouth daily.    . Tiotropium Bromide Monohydrate (SPIRIVA RESPIMAT) 2.5 MCG/ACT AERS Inhale 2 puffs into the lungs daily. 3 Inhaler 1  . zolpidem (AMBIEN) 5 MG tablet Take 1 tablet (5 mg total) by mouth at bedtime as needed for sleep. 30 tablet 0   No current facility-administered medications for this visit.    SURGICAL HISTORY:  Past Surgical History  Procedure Laterality Date  . No past surgeries    . Video bronchoscopy with endobronchial navigation N/A 03/10/2015    Procedure: VIDEO BRONCHOSCOPY WITH ENDOBRONCHIAL NAVIGATION and PLACEMENT OF 4MM X 7MM FIDUCIAL MARKERS x3;  Surgeon: Collene Gobble, MD;  Location: [facility];  Service: Thoracic;  Laterality: N/A;    REVIEW OF SYSTEMS:  A comprehensive review of systems was negative except for: Constitutional: positive for anorexia and fatigue Respiratory: positive for cough, dyspnea on exertion and sputum   PHYSICAL EXAMINATION: General appearance: alert, cooperative, fatigued and no distress Head: Normocephalic, without obvious abnormality, atraumatic Neck: no adenopathy, no JVD, supple, symmetrical, trachea midline and thyroid not enlarged, symmetric, no tenderness/mass/nodules Lymph nodes: Cervical, supraclavicular, and  axillary nodes normal. Resp: clear to auscultation bilaterally Back: symmetric, no curvature. ROM normal. No CVA tenderness. Cardio: regular rate and rhythm, S1, S2 normal, no murmur, click, rub or gallop GI: soft, non-tender; bowel sounds normal; no masses,  no organomegaly Extremities: extremities normal, atraumatic, no cyanosis or edema  ECOG PERFORMANCE STATUS: 1 - Symptomatic but completely ambulatory  Blood pressure 86/48, pulse 109, temperature 98.2 F (36.8 C), temperature source Oral, resp. rate 18, height '5\' 7"'$  (1.702 m), weight 94 lb 1.6 oz (42.683 kg), SpO2 100 %.  LABORATORY DATA: Lab Results  Component Value Date   WBC 11.7* 07/19/2015   HGB 10.3* 07/19/2015   HCT 32.0* 07/19/2015   MCV 88.4 07/19/2015   PLT 200 07/19/2015      Chemistry      Component Value Date/Time   NA 140 07/19/2015 0800   NA 142 06/09/2015 1100   K 3.9 07/19/2015 0800   K 4.3 06/09/2015 1100  CL 102 07/19/2015 0800   CO2 29 07/19/2015 0800   CO2 22 06/09/2015 1100   BUN 13 07/19/2015 0800   BUN 31.8* 06/09/2015 1100   CREATININE 0.74 07/19/2015 0800   CREATININE 1.0 06/09/2015 1100      Component Value Date/Time   CALCIUM 8.5* 07/19/2015 0800   CALCIUM 9.0 06/09/2015 1100   ALKPHOS 52 07/16/2015 0507   ALKPHOS 62 06/09/2015 1100   AST 24 07/16/2015 0507   AST 22 06/09/2015 1100   ALT 18 07/16/2015 0507   ALT 13 06/09/2015 1100   BILITOT 1.2 07/16/2015 0507   BILITOT 0.61 06/09/2015 1100       RADIOGRAPHIC STUDIES: Dg Chest 2 View  07/15/2015  CLINICAL DATA:  Right upper lobe nodule - persistent cough- hx COPD - hx asthma - hx emphysema - former smoker EXAM: CHEST - 2 VIEW COMPARISON:  07/14/2015 FINDINGS: 6.3 cm focal consolidation in the posterior right upper lobe, progressive since previous exam. Fiducial markers project anterior to the process. Left lung remains clear. Heart size normal. Persistent right middle lobe atelectasis/consolidation. No effusion. No pneumothorax.  Visualized skeletal structures are unremarkable. IMPRESSION: 1. Progressive posterior right upper lobe  consolidation. Electronically Signed   By: Lucrezia Europe M.D.   On: 07/15/2015 19:33   Dg Chest 2 View  07/14/2015  CLINICAL DATA:  Follow-up right lung infiltrate with fatigue, cough, shortness of breath. EXAM: CHEST  2 VIEW COMPARISON:  June 29, 2015 FINDINGS: The heart size and mediastinal contours are within normal limits. There is patchy consolidation of the right upper lobe slightly worse compared to prior exam. The lungs are hyperinflated. There is no pleural effusion. The visualized skeletal structures are unremarkable. IMPRESSION: Right upper lobe consolidation/ pneumonia slightly worse compared to prior exam. Emphysema. Electronically Signed   By: Abelardo Diesel M.D.   On: 07/14/2015 15:28   Dg Chest 2 View  06/29/2015  CLINICAL DATA:  Follow-up of pneumonia EXAM: CHEST  2 VIEW COMPARISON:  Portable chest x-ray of June 18, 2015 and PA and lateral chest x-ray of June 07, 2015. FINDINGS: Patchy interstitial infiltrate in the inferior aspect of the right upper lobe has become more conspicuous. There is subsegmental atelectasis in the posterior right perihilar region that is more conspicuous. The left lung exhibits very minimal increased interstitial density in the upper and lower lobes as compared to the previous study. There is no pleural effusion or pneumothorax. The mediastinum is normal in width. The heart is not enlarged. The pulmonary vascularity is not engorged. There are surgical clips in the left upper lobe which appears stable. IMPRESSION: Interval worsening of interstitial infiltrate in the inferior aspect of the right upper lobe. The findings are similar to those originally demonstrated on June 07, 2015. This is suspicious for recurrent pneumonia. There is also minimal increased interstitial density in the left lung as compared to the previous study. Electronically Signed   By:  David  Martinique M.D.   On: 06/29/2015 14:49   Ct Chest Wo Contrast  07/01/2015  CLINICAL DATA:  Recent pneumonia. Hospitalized for 3 days. Discharge from the prior. Decreased appetite. Shortness of breath. History RIGHT upper lobe lung cancer. EXAM: CT CHEST WITHOUT CONTRAST TECHNIQUE: Multidetector CT imaging of the chest was performed following the standard protocol without IV contrast. COMPARISON:  PET-CT 04/16/2015, chest radiograph 06/29/2015 FINDINGS: Mediastinum/Nodes: No axillary spur particular lymphadenopathy. No mediastinal hilar adenopathy no pericardial esophagus normal. Lungs/Pleura: There is improvement in the lingular airspace disease seen on comparison CT  from 04/15/2005. There is increased consolidation RIGHT upper lobe superimposed on central lobular emphysema (image 28, series 3). The send RIGHT middle lobe nodule which was hypermetabolic on comparison CT is no longer evident. (Image 20, series 3). Within the superior segment of the LEFT lower lobe there is a new 7 mm irregular nodule image 22, series 3. Upper abdomen: Low-density lesion in the LEFT hepatic lobe cysts benign cysts. Adrenal glands. No aggressive osseous lesion Musculoskeletal: No aggressive osseous lesion IMPRESSION: 1. No evidence of acute pneumonia. 2. Increased consolidation superimposed on centrilobular emphysema in the RIGHT upper lobe suggests radiation effect. 3. Positive treatment response of RIGHT upper lobe nodule which is no longer measurable. 4. Improvement in the lingular pneumonia seen on comparison CT. 5. Extensive centrilobular emphysema. 6. New 7 mm pulmonary nodule in the superior segment of the LEFT lower lobe likely represents infection or inflammation. Recommend close attention on follow-up. Electronically Signed   By: Suzy Bouchard M.D.   On: 07/01/2015 16:38    ASSESSMENT AND PLAN: This is a very pleasant 81 years old African-American male with unresectable a stage IIA non-small cell lung cancer,  adenocarcinoma completed a course of concurrent chemoradiation with weekly carboplatin and paclitaxel status post 6 cycles of treatment.  The recent CT scan showed evidence for improvement of his disease. I recommended for the patient to continue on observation for now with repeat CT scan of the chest in 3 months. I will arrange for the patient to have flu and pneumonia vaccines today. He was advised to call immediately if he has any concerning symptoms in the interval. The patient voices understanding of current disease status and treatment options and is in agreement with the current care plan.  All questions were answered. The patient knows to call the clinic with any problems, questions or concerns. We can certainly see the patient much sooner if necessary.  Disclaimer: This note was dictated with voice recognition software. Similar sounding words can inadvertently be transcribed and may not be corrected upon review.

## 2015-07-28 NOTE — Telephone Encounter (Signed)
lvm for pt regarding to Feb appt °

## 2015-08-03 ENCOUNTER — Telehealth: Payer: Self-pay | Admitting: Internal Medicine

## 2015-08-03 DIAGNOSIS — J449 Chronic obstructive pulmonary disease, unspecified: Secondary | ICD-10-CM

## 2015-08-03 DIAGNOSIS — C3490 Malignant neoplasm of unspecified part of unspecified bronchus or lung: Secondary | ICD-10-CM

## 2015-08-03 MED ORDER — HYDROCODONE-HOMATROPINE 5-1.5 MG/5ML PO SYRP: 5.0000 mL | ORAL_SOLUTION | Freq: Four times a day (QID) | ORAL | Status: AC | PRN

## 2015-08-03 NOTE — Telephone Encounter (Signed)
MR - yes they want a referral to home hospice. Are you okay with making this referral?  SN will being signing the Hycodan prescription.

## 2015-08-03 NOTE — Telephone Encounter (Signed)
Yes [facility] for home hospice - dx if advanced copd and advanced lung cancer

## 2015-08-03 NOTE — Telephone Encounter (Signed)
Returned call to Hospice, spoke with Jocelyn Lamer. Aware of below per MR. Nothing further needed.

## 2015-08-03 NOTE — Telephone Encounter (Signed)
Yes I can be hospice attending Yes I want them to do symptom mgmt

## 2015-08-03 NOTE — Telephone Encounter (Signed)
Will send message to DOD to see if he will be willing to sign rx for MR.  SN - please advise. Thanks.

## 2015-08-03 NOTE — Telephone Encounter (Signed)
Referral was made to Hospice. They are wanting to know if MR will be attending MD. Does MR want their MD to do symptom management?  MR - please advise. Thanks.

## 2015-08-03 NOTE — Telephone Encounter (Signed)
Spoke with pt's daughter. States that the pt is having issues with a cough. Cough is producing clear mucus. Denies fever, chest tightness, SOB or wheezing. Cough is keeping him up at night. Has been taking Robitussin with no relief. Would like MR's recommendations. Also would like referral to palliative care.  MR - please advise. Thanks.

## 2015-08-03 NOTE — Telephone Encounter (Signed)
Spoke with pt's daughter. Advised her that we will send a referral to hospice and that we have a cough syrup rx for them to pick up. Nothing further was needed.

## 2015-08-03 NOTE — Telephone Encounter (Signed)
Give hycodan cough syrup 40m bid prn  You mean they want referral to home hospice?

## 2015-08-04 ENCOUNTER — Emergency Department (HOSPITAL_COMMUNITY): Payer: Medicare Other

## 2015-08-04 ENCOUNTER — Inpatient Hospital Stay (HOSPITAL_COMMUNITY)
Admission: EM | Admit: 2015-08-04 | Discharge: 2015-08-07 | DRG: 190 | Disposition: A | Discharge status: Hospice | Payer: Medicare Other | Attending: Internal Medicine | Admitting: Internal Medicine

## 2015-08-04 ENCOUNTER — Encounter (HOSPITAL_COMMUNITY): Payer: Self-pay | Admitting: Emergency Medicine

## 2015-08-04 DIAGNOSIS — J189 Pneumonia, unspecified organism: Secondary | ICD-10-CM | POA: Diagnosis not present

## 2015-08-04 DIAGNOSIS — J449 Chronic obstructive pulmonary disease, unspecified: Secondary | ICD-10-CM | POA: Diagnosis present

## 2015-08-04 DIAGNOSIS — E43 Unspecified severe protein-calorie malnutrition: Secondary | ICD-10-CM | POA: Diagnosis present

## 2015-08-04 DIAGNOSIS — Z9221 Personal history of antineoplastic chemotherapy: Secondary | ICD-10-CM

## 2015-08-04 DIAGNOSIS — J45909 Unspecified asthma, uncomplicated: Secondary | ICD-10-CM | POA: Diagnosis present

## 2015-08-04 DIAGNOSIS — Z66 Do not resuscitate: Secondary | ICD-10-CM | POA: Diagnosis present

## 2015-08-04 DIAGNOSIS — J440 Chronic obstructive pulmonary disease with acute lower respiratory infection: Principal | ICD-10-CM | POA: Diagnosis present

## 2015-08-04 DIAGNOSIS — Y95 Nosocomial condition: Secondary | ICD-10-CM | POA: Diagnosis present

## 2015-08-04 DIAGNOSIS — Z9981 Dependence on supplemental oxygen: Secondary | ICD-10-CM

## 2015-08-04 DIAGNOSIS — C3411 Malignant neoplasm of upper lobe, right bronchus or lung: Secondary | ICD-10-CM | POA: Diagnosis present

## 2015-08-04 DIAGNOSIS — K5900 Constipation, unspecified: Secondary | ICD-10-CM | POA: Diagnosis present

## 2015-08-04 DIAGNOSIS — Z515 Encounter for palliative care: Secondary | ICD-10-CM | POA: Diagnosis present

## 2015-08-04 DIAGNOSIS — I10 Essential (primary) hypertension: Secondary | ICD-10-CM | POA: Diagnosis present

## 2015-08-04 DIAGNOSIS — H269 Unspecified cataract: Secondary | ICD-10-CM | POA: Diagnosis present

## 2015-08-04 DIAGNOSIS — M1990 Unspecified osteoarthritis, unspecified site: Secondary | ICD-10-CM | POA: Diagnosis present

## 2015-08-04 DIAGNOSIS — R05 Cough: Secondary | ICD-10-CM | POA: Diagnosis present

## 2015-08-04 DIAGNOSIS — H9190 Unspecified hearing loss, unspecified ear: Secondary | ICD-10-CM | POA: Diagnosis present

## 2015-08-04 DIAGNOSIS — N400 Enlarged prostate without lower urinary tract symptoms: Secondary | ICD-10-CM | POA: Diagnosis present

## 2015-08-04 DIAGNOSIS — Z923 Personal history of irradiation: Secondary | ICD-10-CM

## 2015-08-04 DIAGNOSIS — Z681 Body mass index (BMI) 19 or less, adult: Secondary | ICD-10-CM

## 2015-08-04 DIAGNOSIS — Z87891 Personal history of nicotine dependence: Secondary | ICD-10-CM

## 2015-08-04 DIAGNOSIS — Z79899 Other long term (current) drug therapy: Secondary | ICD-10-CM

## 2015-08-04 DIAGNOSIS — R059 Cough, unspecified: Secondary | ICD-10-CM | POA: Diagnosis present

## 2015-08-04 LAB — CBC WITH DIFFERENTIAL/PLATELET
BASOS ABS: 0 10*3/uL (ref 0.0–0.1)
Basophils Relative: 0 %
Eosinophils Absolute: 0 10*3/uL (ref 0.0–0.7)
Eosinophils Relative: 0 %
HEMATOCRIT: 33 % — AB (ref 39.0–52.0)
Hemoglobin: 10.2 g/dL — ABNORMAL LOW (ref 13.0–17.0)
LYMPHS PCT: 8 %
Lymphs Abs: 0.6 10*3/uL — ABNORMAL LOW (ref 0.7–4.0)
MCH: 27.9 pg (ref 26.0–34.0)
MCHC: 30.9 g/dL (ref 30.0–36.0)
MCV: 90.2 fL (ref 78.0–100.0)
MONO ABS: 0.4 10*3/uL (ref 0.1–1.0)
Monocytes Relative: 5 %
NEUTROS ABS: 7.2 10*3/uL (ref 1.7–7.7)
Neutrophils Relative %: 87 %
Platelets: 231 10*3/uL (ref 150–400)
RBC: 3.66 MIL/uL — AB (ref 4.22–5.81)
RDW: 16.4 % — ABNORMAL HIGH (ref 11.5–15.5)
WBC: 8.3 10*3/uL (ref 4.0–10.5)

## 2015-08-04 LAB — COMPREHENSIVE METABOLIC PANEL
ALT: 14 U/L — AB (ref 17–63)
AST: 21 U/L (ref 15–41)
Albumin: 2.2 g/dL — ABNORMAL LOW (ref 3.5–5.0)
Alkaline Phosphatase: 79 U/L (ref 38–126)
Anion gap: 13 (ref 5–15)
BILIRUBIN TOTAL: 1.4 mg/dL — AB (ref 0.3–1.2)
BUN: 19 mg/dL (ref 6–20)
CO2: 24 mmol/L (ref 22–32)
CREATININE: 0.91 mg/dL (ref 0.61–1.24)
Calcium: 8.9 mg/dL (ref 8.9–10.3)
Chloride: 104 mmol/L (ref 101–111)
GFR calc Af Amer: >60 mL/min (ref >60)
Glucose, Bld: 94 mg/dL (ref 65–99)
Potassium: 4.4 mmol/L (ref 3.5–5.1)
Sodium: 141 mmol/L (ref 135–145)
TOTAL PROTEIN: 6.6 g/dL (ref 6.5–8.1)

## 2015-08-04 LAB — I-STAT CG4 LACTIC ACID, ED: Lactic Acid, Venous: 4.17 mmol/L (ref 0.5–2.0)

## 2015-08-04 MED ORDER — VANCOMYCIN HCL IN DEXTROSE 1-5 GM/200ML-% IV SOLN
1000.0000 mg | Freq: Once | INTRAVENOUS | Status: AC
  Administered 2015-08-05: 1000 mg via INTRAVENOUS
  Filled 2015-08-04: qty 200

## 2015-08-04 MED ORDER — PIPERACILLIN-TAZOBACTAM 3.375 G IVPB
3.3750 g | Freq: Once | INTRAVENOUS | Status: AC
Start: 2015-08-04 — End: 2015-08-05
  Administered 2015-08-04: 3.375 g via INTRAVENOUS
  Filled 2015-08-04: qty 50

## 2015-08-04 MED ORDER — SODIUM CHLORIDE 0.9 % IV BOLUS (SEPSIS)
1000.0000 mL | Freq: Once | INTRAVENOUS | Status: AC
  Administered 2015-08-05: 1000 mL via INTRAVENOUS

## 2015-08-04 NOTE — ED Notes (Signed)
Attempted second IV without success.

## 2015-08-04 NOTE — ED Notes (Signed)
Per EMS- pt here with hx of COPD. Pt has had a cough with congestion for five days with nasal drainage. Mucous is yellow and green. Pt has an inhaler at home that he has been using with no relief.

## 2015-08-05 ENCOUNTER — Encounter (HOSPITAL_COMMUNITY): Payer: Self-pay | Admitting: General Practice

## 2015-08-05 DIAGNOSIS — K5900 Constipation, unspecified: Secondary | ICD-10-CM | POA: Diagnosis present

## 2015-08-05 DIAGNOSIS — I10 Essential (primary) hypertension: Secondary | ICD-10-CM | POA: Diagnosis present

## 2015-08-05 DIAGNOSIS — J189 Pneumonia, unspecified organism: Secondary | ICD-10-CM | POA: Diagnosis not present

## 2015-08-05 DIAGNOSIS — J449 Chronic obstructive pulmonary disease, unspecified: Secondary | ICD-10-CM | POA: Diagnosis not present

## 2015-08-05 DIAGNOSIS — Z7189 Other specified counseling: Secondary | ICD-10-CM | POA: Diagnosis not present

## 2015-08-05 DIAGNOSIS — R05 Cough: Secondary | ICD-10-CM

## 2015-08-05 DIAGNOSIS — Z79899 Other long term (current) drug therapy: Secondary | ICD-10-CM | POA: Diagnosis not present

## 2015-08-05 DIAGNOSIS — J440 Chronic obstructive pulmonary disease with acute lower respiratory infection: Secondary | ICD-10-CM | POA: Diagnosis present

## 2015-08-05 DIAGNOSIS — Z87891 Personal history of nicotine dependence: Secondary | ICD-10-CM | POA: Diagnosis not present

## 2015-08-05 DIAGNOSIS — N400 Enlarged prostate without lower urinary tract symptoms: Secondary | ICD-10-CM

## 2015-08-05 DIAGNOSIS — R0600 Dyspnea, unspecified: Secondary | ICD-10-CM | POA: Diagnosis not present

## 2015-08-05 DIAGNOSIS — Z9981 Dependence on supplemental oxygen: Secondary | ICD-10-CM | POA: Diagnosis not present

## 2015-08-05 DIAGNOSIS — H269 Unspecified cataract: Secondary | ICD-10-CM | POA: Diagnosis present

## 2015-08-05 DIAGNOSIS — Z923 Personal history of irradiation: Secondary | ICD-10-CM | POA: Diagnosis not present

## 2015-08-05 DIAGNOSIS — M1990 Unspecified osteoarthritis, unspecified site: Secondary | ICD-10-CM | POA: Diagnosis present

## 2015-08-05 DIAGNOSIS — Y95 Nosocomial condition: Secondary | ICD-10-CM | POA: Diagnosis present

## 2015-08-05 DIAGNOSIS — E43 Unspecified severe protein-calorie malnutrition: Secondary | ICD-10-CM | POA: Diagnosis not present

## 2015-08-05 DIAGNOSIS — C3411 Malignant neoplasm of upper lobe, right bronchus or lung: Secondary | ICD-10-CM | POA: Diagnosis not present

## 2015-08-05 DIAGNOSIS — Z66 Do not resuscitate: Secondary | ICD-10-CM | POA: Diagnosis present

## 2015-08-05 DIAGNOSIS — Z515 Encounter for palliative care: Secondary | ICD-10-CM | POA: Diagnosis not present

## 2015-08-05 DIAGNOSIS — Z681 Body mass index (BMI) 19 or less, adult: Secondary | ICD-10-CM | POA: Diagnosis not present

## 2015-08-05 DIAGNOSIS — J45909 Unspecified asthma, uncomplicated: Secondary | ICD-10-CM | POA: Diagnosis present

## 2015-08-05 DIAGNOSIS — H9190 Unspecified hearing loss, unspecified ear: Secondary | ICD-10-CM | POA: Diagnosis present

## 2015-08-05 DIAGNOSIS — Z9221 Personal history of antineoplastic chemotherapy: Secondary | ICD-10-CM | POA: Diagnosis not present

## 2015-08-05 LAB — EXPECTORATED SPUTUM ASSESSMENT W GRAM STAIN, RFLX TO RESP C

## 2015-08-05 LAB — EXPECTORATED SPUTUM ASSESSMENT W REFEX TO RESP CULTURE

## 2015-08-05 LAB — MRSA PCR SCREENING: MRSA by PCR: NEGATIVE

## 2015-08-05 LAB — I-STAT CG4 LACTIC ACID, ED: LACTIC ACID, VENOUS: 2.05 mmol/L — AB (ref 0.5–2.0)

## 2015-08-05 LAB — STREP PNEUMONIAE URINARY ANTIGEN: STREP PNEUMO URINARY ANTIGEN: NEGATIVE

## 2015-08-05 MED ORDER — DEXTROSE 5 % IV SOLN
1.0000 g | Freq: Two times a day (BID) | INTRAVENOUS | Status: DC
  Administered 2015-08-05: 1 g via INTRAVENOUS
  Filled 2015-08-05 (×4): qty 1

## 2015-08-05 MED ORDER — ONDANSETRON HCL 4 MG PO TABS
4.0000 mg | ORAL_TABLET | Freq: Four times a day (QID) | ORAL | Status: DC | PRN
Start: 2015-08-05 — End: 2015-08-07

## 2015-08-05 MED ORDER — FLUTICASONE PROPIONATE 50 MCG/ACT NA SUSP
2.0000 | Freq: Every day | NASAL | Status: DC
  Administered 2015-08-05 – 2015-08-07 (×3): 2 via NASAL
  Filled 2015-08-05 (×2): qty 16

## 2015-08-05 MED ORDER — PANTOPRAZOLE SODIUM 40 MG PO TBEC
40.0000 mg | DELAYED_RELEASE_TABLET | Freq: Every day | ORAL | Status: DC
  Administered 2015-08-05 – 2015-08-06 (×2): 40 mg via ORAL
  Filled 2015-08-05 (×2): qty 1

## 2015-08-05 MED ORDER — TIOTROPIUM BROMIDE MONOHYDRATE 18 MCG IN CAPS
18.0000 ug | ORAL_CAPSULE | Freq: Every day | RESPIRATORY_TRACT | Status: DC
  Administered 2015-08-05 – 2015-08-07 (×3): 18 ug via RESPIRATORY_TRACT
  Filled 2015-08-05: qty 5

## 2015-08-05 MED ORDER — SODIUM CHLORIDE 0.9 % IJ SOLN
3.0000 mL | Freq: Two times a day (BID) | INTRAMUSCULAR | Status: DC
  Administered 2015-08-06 – 2015-08-07 (×3): 3 mL via INTRAVENOUS

## 2015-08-05 MED ORDER — ZOLPIDEM TARTRATE 5 MG PO TABS
5.0000 mg | ORAL_TABLET | Freq: Every evening | ORAL | Status: DC | PRN
  Administered 2015-08-06: 5 mg via ORAL
  Filled 2015-08-05: qty 1

## 2015-08-05 MED ORDER — DEXTROSE 5 % IV SOLN
1.0000 g | Freq: Two times a day (BID) | INTRAVENOUS | Status: DC
  Administered 2015-08-05 – 2015-08-06 (×2): 1 g via INTRAVENOUS
  Filled 2015-08-05 (×4): qty 1

## 2015-08-05 MED ORDER — SODIUM CHLORIDE 0.9 % IV SOLN
INTRAVENOUS | Status: DC
  Administered 2015-08-05: 03:00:00 via INTRAVENOUS

## 2015-08-05 MED ORDER — POLYETHYLENE GLYCOL 3350 17 G PO PACK
17.0000 g | PACK | Freq: Every day | ORAL | Status: DC | PRN
  Filled 2015-08-05: qty 1

## 2015-08-05 MED ORDER — VANCOMYCIN HCL IN DEXTROSE 750-5 MG/150ML-% IV SOLN
750.0000 mg | INTRAVENOUS | Status: DC
  Administered 2015-08-06: 750 mg via INTRAVENOUS
  Filled 2015-08-05 (×2): qty 150

## 2015-08-05 MED ORDER — MEGESTROL ACETATE 40 MG/ML PO SUSP
200.0000 mg | Freq: Two times a day (BID) | ORAL | Status: DC
  Administered 2015-08-05 – 2015-08-06 (×3): 200 mg via ORAL
  Filled 2015-08-05 (×6): qty 5

## 2015-08-05 MED ORDER — SODIUM CHLORIDE 0.9 % IV SOLN: INTRAVENOUS | Status: DC

## 2015-08-05 MED ORDER — IPRATROPIUM-ALBUTEROL 0.5-2.5 (3) MG/3ML IN SOLN
3.0000 mL | Freq: Four times a day (QID) | RESPIRATORY_TRACT | Status: DC
  Administered 2015-08-05: 3 mL via RESPIRATORY_TRACT
  Filled 2015-08-05 (×2): qty 3

## 2015-08-05 MED ORDER — HYDROCODONE-HOMATROPINE 5-1.5 MG/5ML PO SYRP
5.0000 mL | ORAL_SOLUTION | Freq: Four times a day (QID) | ORAL | Status: DC | PRN
  Administered 2015-08-05 – 2015-08-06 (×2): 5 mL via ORAL
  Filled 2015-08-05 (×2): qty 5

## 2015-08-05 MED ORDER — MORPHINE SULFATE (PF) 2 MG/ML IV SOLN
1.0000 mg | INTRAVENOUS | Status: DC | PRN
  Administered 2015-08-06 (×2): 1 mg via INTRAVENOUS
  Filled 2015-08-05 (×2): qty 1

## 2015-08-05 MED ORDER — ONDANSETRON HCL 4 MG/2ML IJ SOLN: 4.0000 mg | Freq: Four times a day (QID) | INTRAMUSCULAR | Status: DC | PRN

## 2015-08-05 MED ORDER — DOCUSATE SODIUM 100 MG PO CAPS
100.0000 mg | ORAL_CAPSULE | Freq: Every day | ORAL | Status: DC
  Administered 2015-08-05 – 2015-08-07 (×3): 100 mg via ORAL
  Filled 2015-08-05 (×3): qty 1

## 2015-08-05 MED ORDER — SODIUM CHLORIDE 3 % IN NEBU
5.0000 mL | INHALATION_SOLUTION | Freq: Once | RESPIRATORY_TRACT | Status: DC | PRN
  Filled 2015-08-05: qty 8

## 2015-08-05 MED ORDER — GUAIFENESIN ER 600 MG PO TB12
600.0000 mg | ORAL_TABLET | Freq: Two times a day (BID) | ORAL | Status: DC
  Administered 2015-08-05 – 2015-08-07 (×5): 600 mg via ORAL
  Filled 2015-08-05 (×6): qty 1

## 2015-08-05 MED ORDER — LEVALBUTEROL HCL 0.63 MG/3ML IN NEBU
0.6300 mg | INHALATION_SOLUTION | Freq: Four times a day (QID) | RESPIRATORY_TRACT | Status: DC
  Administered 2015-08-05 (×2): 0.63 mg via RESPIRATORY_TRACT
  Filled 2015-08-05 (×7): qty 3

## 2015-08-05 MED ORDER — SODIUM CHLORIDE 3 % IN NEBU
5.0000 mL | INHALATION_SOLUTION | RESPIRATORY_TRACT | Status: DC | PRN
  Filled 2015-08-05: qty 8

## 2015-08-05 MED ORDER — ACETAMINOPHEN 650 MG RE SUPP: 650.0000 mg | Freq: Four times a day (QID) | RECTAL | Status: DC | PRN

## 2015-08-05 MED ORDER — GUAIFENESIN ER 600 MG PO TB12: 1200.0000 mg | ORAL_TABLET | Freq: Two times a day (BID) | ORAL | Status: DC

## 2015-08-05 MED ORDER — BOOST HIGH PROTEIN PO LIQD
237.0000 mL | Freq: Three times a day (TID) | ORAL | Status: DC
Start: 2015-08-05 — End: 2015-08-06
  Administered 2015-08-05: 237 mL via ORAL
  Filled 2015-08-05 (×8): qty 237

## 2015-08-05 MED ORDER — TAMSULOSIN HCL 0.4 MG PO CAPS
0.4000 mg | ORAL_CAPSULE | Freq: Every day | ORAL | Status: DC
  Administered 2015-08-05 – 2015-08-07 (×3): 0.4 mg via ORAL
  Filled 2015-08-05 (×3): qty 1

## 2015-08-05 MED ORDER — ACETAMINOPHEN 325 MG PO TABS
650.0000 mg | ORAL_TABLET | Freq: Four times a day (QID) | ORAL | Status: DC | PRN
  Administered 2015-08-05: 650 mg via ORAL
  Filled 2015-08-05: qty 2

## 2015-08-05 MED ORDER — MOMETASONE FURO-FORMOTEROL FUM 200-5 MCG/ACT IN AERO
2.0000 | INHALATION_SPRAY | Freq: Two times a day (BID) | RESPIRATORY_TRACT | Status: DC
  Administered 2015-08-05 – 2015-08-07 (×5): 2 via RESPIRATORY_TRACT
  Filled 2015-08-05: qty 8.8

## 2015-08-05 MED ORDER — ENOXAPARIN SODIUM 30 MG/0.3ML ~~LOC~~ SOLN
20.0000 mg | SUBCUTANEOUS | Status: DC
  Filled 2015-08-05: qty 0.3
  Filled 2015-08-05: qty 0.2

## 2015-08-05 MED ORDER — ENSURE ENLIVE PO LIQD
237.0000 mL | Freq: Three times a day (TID) | ORAL | Status: DC
  Administered 2015-08-06: 237 mL via ORAL
  Filled 2015-08-05 (×2): qty 237

## 2015-08-05 MED ORDER — IPRATROPIUM BROMIDE 0.02 % IN SOLN
0.5000 mg | Freq: Four times a day (QID) | RESPIRATORY_TRACT | Status: DC
  Administered 2015-08-05 (×2): 0.5 mg via RESPIRATORY_TRACT
  Filled 2015-08-05 (×2): qty 2.5

## 2015-08-05 NOTE — Progress Notes (Signed)
ANTIBIOTIC CONSULT NOTE - INITIAL  Pharmacy Consult for Vancocin and Tressie Ellis Indication: rule out pneumonia  No Known Allergies  Patient Measurements: Weight: 42.7kg  Vital Signs: Temp: 98.4 F (36.9 C) (12/07 2109) BP: 99/64 mmHg (12/08 0145) Pulse Rate: 92 (12/08 0145)  Labs:  Recent Labs  08/04/15 2210  WBC 8.3  HGB 10.2*  PLT 231  CREATININE 0.91   Estimated Creatinine Clearance: 36.5 mL/min (by C-G formula based on Cr of 0.91).   Microbiology: Recent Results (from the past 720 hour(s))  Urine culture     Status: None   Collection Time: 07/14/15 12:48 PM  Result Value Ref Range Status   Colony Count NO GROWTH  Final   Organism ID, Bacteria NO GROWTH  Final  Culture, blood (routine x 2)     Status: None   Collection Time: 07/15/15  5:30 PM  Result Value Ref Range Status   Specimen Description BLOOD LEFT ANTECUBITAL  Final   Special Requests BOTTLES DRAWN AEROBIC AND ANAEROBIC 5 CC  Final   Culture   Final    NO GROWTH 5 DAYS Performed at [facility]    Report Status 07/20/2015 FINAL  Final  Culture, blood (routine x 2)     Status: None   Collection Time: 07/15/15  5:35 PM  Result Value Ref Range Status   Specimen Description BLOOD BLOOD RIGHT FOREARM  Final   Special Requests BOTTLES DRAWN AEROBIC AND ANAEROBIC 5 CC  Final   Culture   Final    NO GROWTH 5 DAYS Performed at [facility]    Report Status 07/20/2015 FINAL  Final  Culture, sputum-assessment     Status: None   Collection Time: 07/16/15  3:07 AM  Result Value Ref Range Status   Specimen Description SPUTUM  Final   Special Requests NONE  Final   Sputum evaluation   Final    THIS SPECIMEN IS ACCEPTABLE. RESPIRATORY CULTURE REPORT TO FOLLOW.   Report Status 07/16/2015 FINAL  Final  Urine culture     Status: None   Collection Time: 07/16/15  3:07 AM  Result Value Ref Range Status   Specimen Description URINE, CLEAN CATCH  Final   Special Requests NONE  Final   Culture   Final     NO GROWTH 1 DAY Performed at [facility]    Report Status 07/17/2015 FINAL  Final  Culture, respiratory (NON-Expectorated)     Status: None   Collection Time: 07/16/15  3:07 AM  Result Value Ref Range Status   Specimen Description SPUTUM  Final   Special Requests NONE  Final   Gram Stain   Final    NO WBC SEEN NO SQUAMOUS EPITHELIAL CELLS SEEN NO ORGANISMS SEEN Performed at [facility]    Culture   Final    NORMAL OROPHARYNGEAL FLORA Performed at [facility]    Report Status 07/19/2015 FINAL  Final    Medical History: Past Medical History  Diagnosis Date  . BPH (benign prostatic hypertrophy)     takes Rapaflo daily  . Chronic airway obstruction, not elsewhere classified   . Asthma     uses Spiriva and Dulera daily  . Emphysema lung ([place])   . Chronic bronchitis ([place])   . Shortness of breath     Albuterol inhaler as needed;lying and exertion  . Pneumonia     "a few times" (08/26/2014)  . Cough     productive but without fever  . Arthritis   .  Joint pain   . Cataracts, bilateral     immature  . History of shingles   . Cancer ([place])   . Hypertension 07/15/2015    Pt denies hx of HTN    Assessment: 81yo male c/o cough and congestion w/ nasal drainage, no relief w/ inhaler, known COPD, concern for PNA, to begin IV ABX.  Goal of Therapy:  Vancomycin trough level 15-20 mcg/ml  Plan:  Rec'd vanc 1g and Zosyn 3.375g IV in ED; will continue with vancomycin '750mg'$  IV Q24H and Fortaz 1g IV Q12H and monitor CBC, Cx, levels prn.  Frank Garcia, PharmD, BCPS  08/05/2015,1:56 AM

## 2015-08-05 NOTE — Progress Notes (Signed)
RT discontinued duoneb due to patient getting xopenex and atrovent.

## 2015-08-05 NOTE — ED Notes (Signed)
Attempted report x1. 

## 2015-08-05 NOTE — ED Notes (Signed)
Patient was given a  [place]. Regular diet order taken for lunch.

## 2015-08-05 NOTE — ED Notes (Signed)
Family at bedside but decided to leave.  I was provided with their information.

## 2015-08-05 NOTE — ED Provider Notes (Signed)
CSN: [id number]     Arrival date & time 08/04/15  2059 History   First MD Initiated Contact with Patient 08/04/15 2100     Chief Complaint  Patient presents with  . COPD  . Nasal Congestion  . Cough     (Consider location/radiation/quality/duration/timing/severity/associated sxs/prior Treatment) Patient is a 81 y.o. male presenting with cough. The history is provided by the patient (Patient complains of a cough for a few day.).  Cough Cough characteristics:  Productive Severity:  Moderate Onset quality:  Gradual Timing:  Constant Progression:  Waxing and waning Chronicity:  Recurrent Context: not animal exposure   Associated symptoms: no chest pain, no eye discharge, no headaches and no rash     Past Medical History  Diagnosis Date  . BPH (benign prostatic hypertrophy)     takes Rapaflo daily  . Chronic airway obstruction, not elsewhere classified   . Asthma     uses Spiriva and Dulera daily  . Emphysema lung ([place])   . Chronic bronchitis ([place])   . Shortness of breath     Albuterol inhaler as needed;lying and exertion  . Pneumonia     "a few times" (08/26/2014)  . Cough     productive but without fever  . Arthritis   . Joint pain   . Cataracts, bilateral     immature  . History of shingles   . Cancer ([place])   . Hypertension 07/15/2015    Pt denies hx of HTN   Past Surgical History  Procedure Laterality Date  . No past surgeries    . Video bronchoscopy with endobronchial navigation N/A 03/10/2015    Procedure: VIDEO BRONCHOSCOPY WITH ENDOBRONCHIAL NAVIGATION and PLACEMENT OF 4MM X 7MM FIDUCIAL MARKERS x3;  Surgeon: Collene Gobble, MD;  Location: [facility] OR;  Service: Thoracic;  Laterality: N/A;   Family History  Problem Relation Age of Onset  . COPD Father   . Heart failure Mother    Social History  Substance Use Topics  . Smoking status: Former Smoker -- 1.50 packs/day for 45 years    Types: Cigarettes    Quit date: 04/07/2008  . Smokeless tobacco: Former Systems developer     Quit date: 04/07/2008     Comment: quit smoking 3+yrs ago  . Alcohol Use: No     Comment: social drinker prior to sickness    Review of Systems  Constitutional: Negative for appetite change and fatigue.  HENT: Negative for congestion, ear discharge and sinus pressure.   Eyes: Negative for discharge.  Respiratory: Positive for cough.   Cardiovascular: Negative for chest pain.  Gastrointestinal: Negative for abdominal pain and diarrhea.  Genitourinary: Negative for frequency and hematuria.  Musculoskeletal: Negative for back pain.  Skin: Negative for rash.  Neurological: Negative for seizures and headaches.  Psychiatric/Behavioral: Negative for hallucinations.      Allergies  Review of patient's allergies indicates no known allergies.  Home Medications   Prior to Admission medications   Medication Sig Start Date End Date Taking? Authorizing Provider  albuterol (PROVENTIL HFA;VENTOLIN HFA) 108 (90 BASE) MCG/ACT inhaler Inhale 2 puffs into the lungs every 4 (four) hours as needed for wheezing or shortness of breath. 01/27/14  Yes Brand Males, MD  albuterol (PROVENTIL) (2.5 MG/3ML) 0.083% nebulizer solution USE 1 VIAL PER NEBULIZER EVERY 2 HOURS AS NEEDED FOR WHEEZING AND SHORTNESS OF BREATH 07/06/15  Yes Brand Males, MD  HYDROcodone-homatropine (HYCODAN) 5-1.5 MG/5ML syrup Take 5 mLs by mouth every 6 (six) hours as needed for cough.  08/03/15  Yes Noralee Space, MD  levalbuterol Penne Lash) 1.25 MG/3ML nebulizer solution Take 0.63 mg by nebulization every 6 (six) hours as needed for wheezing. Use 3 times daily x 4 days, then every 6 hours as needed. 07/19/15  Yes Eugenie Filler, MD  Magnesium Hydroxide (MILK OF MAGNESIA PO) Take 15-30 mLs by mouth daily as needed (constipation).    Yes Historical Provider, MD  megestrol (MEGACE) 40 MG/ML suspension TAKE 5 MLS (200 MG TOTAL) BY MOUTH 2 (TWO) TIMES DAILY. 07/19/15  Yes Tammy S Parrett, NP  mometasone-formoterol (DULERA) 200-5  MCG/ACT AERO Inhale 2 puffs into the lungs 2 (two) times daily. 03/15/15  Yes Brand Males, MD  pantoprazole (PROTONIX) 40 MG tablet Take 1 tablet (40 mg total) by mouth daily at 12 noon. 06/09/15  Yes Erick Colace, NP  polyethylene glycol (MIRALAX / GLYCOLAX) packet Take 17 g by mouth daily as needed for mild constipation or moderate constipation. Hold for diarrhea 07/19/15  Yes Eugenie Filler, MD  RAPAFLO 8 MG CAPS capsule Take 1 capsule by mouth daily. 03/04/14  Yes Historical Provider, MD  Tiotropium Bromide Monohydrate (SPIRIVA RESPIMAT) 2.5 MCG/ACT AERS Inhale 2 puffs into the lungs daily. 04/09/15  Yes Brand Males, MD  zolpidem (AMBIEN) 5 MG tablet Take 1 tablet (5 mg total) by mouth at bedtime as needed for sleep. 06/09/15  Yes Erick Colace, NP  feeding supplement, ENSURE ENLIVE, (ENSURE ENLIVE) LIQD Take 237 mLs by mouth 3 (three) times daily between meals. 07/19/15   Eugenie Filler, MD  fluticasone (FLONASE) 50 MCG/ACT nasal spray Place 2 sprays into both nostrils daily. Patient taking differently: Place 2 sprays into both nostrils daily as needed for allergies.  01/11/15   Brand Males, MD  guaiFENesin (MUCINEX) 600 MG 12 hr tablet Take 2 tablets (1,200 mg total) by mouth 2 (two) times daily. Take 2 tablets 2 times daily x 4 days then use as needed. Patient not taking: Reported on 08/04/2015 07/19/15   Eugenie Filler, MD  HYDROcodone-acetaminophen (HYCET) 7.5-325 mg/15 ml solution Take 10 mLs by mouth 4 (four) times daily as needed for moderate pain. Patient not taking: Reported on 07/14/2015 05/26/15   Thea Silversmith, MD  levofloxacin (LEVAQUIN) 750 MG tablet Take 1 tablet (750 mg total) by mouth every other day. Take for 4 days then stop. Patient not taking: Reported on 08/04/2015 07/19/15   Eugenie Filler, MD  Nutritional Supplements (ENSURE HIGH PROTEIN) LIQD Take 1 Bottle by mouth 3 (three) times daily. To be taken 1 bottle three times a day between meals; not  as meal substitution.. 04/12/15   Barton Dubois, MD  predniSONE (DELTASONE) 10 MG tablet Take 1-3 tablets (10-30 mg total) by mouth daily before breakfast. Take 3 tablets ('30mg'$ ) daily x 3 days, then 2 tablets ('20mg'$ ) daily x 3 days, then 1 tablet ('10mg'$ ) daily x 3 days then stop. Patient not taking: Reported on 08/04/2015 07/19/15   Eugenie Filler, MD  prochlorperazine (COMPAZINE) 10 MG tablet Take 1 tablet (10 mg total) by mouth every 6 (six) hours as needed for nausea or vomiting. Patient not taking: Reported on 07/15/2015 04/26/15   Curt Bears, MD   BP 107/76 mmHg  Pulse 90  Temp(Src) 98.4 F (36.9 C)  Resp 28  SpO2 88% Physical Exam  Constitutional: He is oriented to person, place, and time. He appears well-developed.  HENT:  Head: Normocephalic.  Eyes: Conjunctivae and EOM are normal. No scleral icterus.  Neck: Neck  supple. No thyromegaly present.  Cardiovascular: Normal rate and regular rhythm.  Exam reveals no gallop and no friction rub.   No murmur heard. Pulmonary/Chest: No stridor. He has wheezes. He has no rales. He exhibits no tenderness.  Abdominal: He exhibits no distension. There is no tenderness. There is no rebound.  Musculoskeletal: Normal range of motion. He exhibits no edema.  Lymphadenopathy:    He has no cervical adenopathy.  Neurological: He is oriented to person, place, and time. He exhibits normal muscle tone. Coordination normal.  Skin: No rash noted. No erythema.  Psychiatric: He has a normal mood and affect. His behavior is normal.    ED Course  Procedures (including critical care time) Labs Review Labs Reviewed  CBC WITH DIFFERENTIAL/PLATELET - Abnormal; Notable for the following:    RBC 3.66 (*)    Hemoglobin 10.2 (*)    HCT 33.0 (*)    RDW 16.4 (*)    Lymphs Abs 0.6 (*)    All other components within normal limits  COMPREHENSIVE METABOLIC PANEL - Abnormal; Notable for the following:    Albumin 2.2 (*)    ALT 14 (*)    Total Bilirubin 1.4  (*)    All other components within normal limits  I-STAT CG4 LACTIC ACID, ED - Abnormal; Notable for the following:    Lactic Acid, Venous 4.17 (*)    All other components within normal limits    Imaging Review Dg Chest Port 1 View  08/04/2015  CLINICAL DATA:  81 year old male with shortness of breath EXAM: PORTABLE CHEST 1 VIEW COMPARISON:  07/15/2015 FINDINGS: Single frontal view of the chest demonstrates emphysematous changes of the lungs. An area of consolidation noted in the right upper lung field measuring approximately 3.2 x 8.3 cm and appears larger in size compared to prior study. Multiple surgical clips noted in the adjacent inferior lung parenchyma. The left lung is clear. There is no pneumothorax. The cardiac silhouette is within - normal limits. The osseous structures are grossly unremarkable. IMPRESSION: Interval progression of the right upper lung field opacity. Follow-up resolution recommended. Electronically Signed   By: Anner Crete M.D.   On: 08/04/2015 21:48   I have personally reviewed and evaluated these images and lab results as part of my medical decision-making.   EKG Interpretation None     CRITICAL CARE Performed by: Tymia Streb L Total critical care time:  40 minutes Critical care time was exclusive of separately billable procedures and treating other patients. Critical care was necessary to treat or prevent imminent or life-threatening deterioration. Critical care was time spent personally by me on the following activities: development of treatment plan with patient and/or surrogate as well as nursing, discussions with consultants, evaluation of patient's response to treatment, examination of patient, obtaining history from patient or surrogate, ordering and performing treatments and interventions, ordering and review of laboratory studies, ordering and review of radiographic studies, pulse oximetry and re-evaluation of patient's condition.   MDM   Final  diagnoses:  Healthcare-associated pneumonia    Patient with history of lung cancer. Last chemotherapy was in October. Patient has history of COPD. He has been coughing and short of breath for a few days. Chest x-ray shows infiltrate right upper lobe worse than previously seen. Suspect this is his lung cancer with possible pneumonia.  Patient will be admitted and treated for hospital-acquired pneumonia. I discussed CODE STATUS with the patient. He states that he wishes to be a DO NOT RESUSCITATE. I told the admitting hospitalist  about this.     Milton Ferguson, MD 08/05/15 Laureen Abrahams

## 2015-08-05 NOTE — H&P (Signed)
[facility] History and Physical  JERREL TIBERIO MWU:[id number] DOB: Jan 01, 1929 DOA: 08/04/2015  Referring physician: ED PCP: Brand Males, MD   Chief Complaint: Cough  HPI:  Mr. 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 Patient notes that the cough gradually worsened over the last 5 days. Cough is productive with yellow sputum. Patient notes difficulty clearing secretions at times. Trying home medications including Robitussin and Hycodan syrup without full relief of symptoms. Patient denies any fever, nausea, vomiting, abdominal pain, chest pain, or hemoptysis. Patient diagnosed with possible healthcare acquired pneumonia 07/15/2015 for which patient stated [facility] until 07/19/2015 discharged home with 4 days of Levaquin.  Discussed with patient and family regarding patient's CODE STATUS. Patient states that when the good [facility] is ready to take him it is his time to go. Daughters at bedside also agreed that patient should be a DO NOT RESUSCITATE.  Patient notes that he's had a cough with sputum production has been progressively worsening over the last 5 days.  Upon admission into the hospital a chest x-ray revealed interval worsening of right upper lobe opacity. However he had an elevated lactate at 4.7 which gave concern for possibility of a acute infection giving his immunocompromised history   Review of Systems  Constitutional: Positive for chills, weight loss and malaise/fatigue. Negative for fever.  HENT: Positive for hearing loss.   Eyes: Negative for pain and discharge.  Respiratory: Positive for cough and sputum production. Negative for hemoptysis and wheezing.   Cardiovascular: Negative for chest pain and leg swelling.  Gastrointestinal: Positive for constipation. Negative for diarrhea.   Genitourinary: Negative for urgency and flank pain.  Musculoskeletal: Positive for joint pain. Negative for falls.  Skin: Negative for itching and rash.  Neurological: Positive for weakness. Negative for seizures, loss of consciousness and headaches.  Psychiatric/Behavioral: Negative for suicidal ideas and substance abuse.     Past Medical History  Diagnosis Date  . BPH (benign prostatic hypertrophy)     takes Rapaflo daily  . Chronic airway obstruction, not elsewhere classified   . Asthma     uses Spiriva and Dulera daily  . Emphysema lung ([place])   . Chronic bronchitis ([place])   . Shortness of breath     Albuterol inhaler as needed;lying and exertion  . Pneumonia     "a few times" (08/26/2014)  . Cough     productive but without fever  . Arthritis   . Joint pain   . Cataracts, bilateral     immature  . History of shingles   . Cancer ([place])   . Hypertension 07/15/2015    Pt denies hx of HTN     Past Surgical History  Procedure Laterality Date  . No past surgeries    . Video bronchoscopy with endobronchial navigation N/A 03/10/2015    Procedure: VIDEO BRONCHOSCOPY WITH ENDOBRONCHIAL NAVIGATION and PLACEMENT OF 4MM X 7MM FIDUCIAL MARKERS x3;  Surgeon: Collene Gobble, MD;  Location: [facility];  Service: Thoracic;  Laterality: N/A;      Social History:  reports that he quit smoking about 7 years ago. His smoking use included Cigarettes. He has a 67.5 pack-year smoking history. He quit smokeless tobacco use about 7 years ago. He reports that he does not drink alcohol or use illicit drugs. Where does patient live--home   and with whom  if at home? Family Can patient participate in ADLs? Needs assistance  No Known Allergies  Family History  Problem Relation Age of Onset  . COPD Father   . Heart failure Mother         Prior to Admission medications   Medication Sig Start Date End Date Taking? Authorizing Provider  albuterol (PROVENTIL HFA;VENTOLIN HFA) 108 (90 BASE)  MCG/ACT inhaler Inhale 2 puffs into the lungs every 4 (four) hours as needed for wheezing or shortness of breath. 01/27/14  Yes Brand Males, MD  albuterol (PROVENTIL) (2.5 MG/3ML) 0.083% nebulizer solution USE 1 VIAL PER NEBULIZER EVERY 2 HOURS AS NEEDED FOR WHEEZING AND SHORTNESS OF BREATH 07/06/15  Yes Brand Males, MD  HYDROcodone-homatropine (HYCODAN) 5-1.5 MG/5ML syrup Take 5 mLs by mouth every 6 (six) hours as needed for cough. 08/03/15  Yes Noralee Space, MD  levalbuterol Penne Lash) 1.25 MG/3ML nebulizer solution Take 0.63 mg by nebulization every 6 (six) hours as needed for wheezing. Use 3 times daily x 4 days, then every 6 hours as needed. 07/19/15  Yes Eugenie Filler, MD  Magnesium Hydroxide (MILK OF MAGNESIA PO) Take 15-30 mLs by mouth daily as needed (constipation).    Yes Historical Provider, MD  megestrol (MEGACE) 40 MG/ML suspension TAKE 5 MLS (200 MG TOTAL) BY MOUTH 2 (TWO) TIMES DAILY. 07/19/15  Yes Tammy S Parrett, NP  mometasone-formoterol (DULERA) 200-5 MCG/ACT AERO Inhale 2 puffs into the lungs 2 (two) times daily. 03/15/15  Yes Brand Males, MD  pantoprazole (PROTONIX) 40 MG tablet Take 1 tablet (40 mg total) by mouth daily at 12 noon. 06/09/15  Yes Erick Colace, NP  polyethylene glycol (MIRALAX / GLYCOLAX) packet Take 17 g by mouth daily as needed for mild constipation or moderate constipation. Hold for diarrhea 07/19/15  Yes Eugenie Filler, MD  RAPAFLO 8 MG CAPS capsule Take 1 capsule by mouth daily. 03/04/14  Yes Historical Provider, MD  Tiotropium Bromide Monohydrate (SPIRIVA RESPIMAT) 2.5 MCG/ACT AERS Inhale 2 puffs into the lungs daily. 04/09/15  Yes Brand Males, MD  zolpidem (AMBIEN) 5 MG tablet Take 1 tablet (5 mg total) by mouth at bedtime as needed for sleep. 06/09/15  Yes Erick Colace, NP  feeding supplement, ENSURE ENLIVE, (ENSURE ENLIVE) LIQD Take 237 mLs by mouth 3 (three) times daily between meals. 07/19/15   Eugenie Filler, MD  fluticasone  (FLONASE) 50 MCG/ACT nasal spray Place 2 sprays into both nostrils daily. Patient taking differently: Place 2 sprays into both nostrils daily as needed for allergies.  01/11/15   Brand Males, MD  guaiFENesin (MUCINEX) 600 MG 12 hr tablet Take 2 tablets (1,200 mg total) by mouth 2 (two) times daily. Take 2 tablets 2 times daily x 4 days then use as needed. Patient not taking: Reported on 08/04/2015 07/19/15   Eugenie Filler, MD  HYDROcodone-acetaminophen (HYCET) 7.5-325 mg/15 ml solution Take 10 mLs by mouth 4 (four) times daily as needed for moderate pain. Patient not taking: Reported on 07/14/2015 05/26/15   Thea Silversmith, MD  levofloxacin (LEVAQUIN) 750 MG tablet Take 1 tablet (750 mg total) by mouth every other day. Take for 4 days then stop. Patient not taking: Reported on 08/04/2015 07/19/15   Eugenie Filler, MD  Nutritional Supplements (ENSURE HIGH PROTEIN) LIQD Take 1 Bottle by mouth 3 (three) times daily. To be taken 1 bottle three times a day between meals; not as meal substitution.. 04/12/15   Barton Dubois, MD  predniSONE (DELTASONE) 10 MG  tablet Take 1-3 tablets (10-30 mg total) by mouth daily before breakfast. Take 3 tablets ('30mg'$ ) daily x 3 days, then 2 tablets ('20mg'$ ) daily x 3 days, then 1 tablet ('10mg'$ ) daily x 3 days then stop. Patient not taking: Reported on 08/04/2015 07/19/15   Eugenie Filler, MD  prochlorperazine (COMPAZINE) 10 MG tablet Take 1 tablet (10 mg total) by mouth every 6 (six) hours as needed for nausea or vomiting. Patient not taking: Reported on 07/15/2015 04/26/15   Curt Bears, MD     Physical Exam: Filed Vitals:   08/04/15 2111 08/04/15 2130 08/05/15 0036 08/05/15 0130  BP:  107/76 97/62 109/62  Pulse:   101 93  Temp:      Resp: '24 28 26 '$ 32  SpO2:   100% 100%     Constitutional: Vital signs reviewed. Patient is a chronically ill-appearing and significantly cachectic  Head: Normocephalic and atraumatic . Bitemporal wasting Ear: TM normal  bilaterally  Mouth: no erythema or exudates, MMM  Eyes: PERRL, EOMI, conjunctivae normal, No scleral icterus.  Neck: Supple, Trachea midline normal ROM, No JVD, mass, thyromegaly, or carotid bruit present.  Cardiovascular: Mild tachycardia with positive systolic ejection murmur, pulses symmetric and intact bilaterally  Pulmonary/Chest: Coarse breath sounds with rhonchi appreciated the right upper lobe. Rales heard throughout both lung fields. Abdominal: Soft. Non-tender, non-distended, bowel sounds are normal, no masses, organomegaly, or guarding present.  GU: no CVA tenderness Musculoskeletal: No joint deformities, erythema, or stiffness, ROM full and no nontender Ext: no edema and no cyanosis, pulses palpable bilaterally (DP and PT)  Hematology: no cervical, inginal, or axillary adenopathy.  Neurological: A&O x3, Strenght is normal and symmetric bilaterally, cranial nerve II-XII are grossly intact, no focal motor deficit, sensory intact to light touch bilaterally.  Skin: Warm, dry and intact. No rash, cyanosis, or clubbing.  Psychiatric: Depressed overall mood and affect. speech and behavior is normal. Judgment and thought content normal. Cognition and memory are normal.      Data Review   Micro Results No results found for this or any previous visit (from the past 240 hour(s)).  Radiology Reports Dg Chest 2 View  07/15/2015  CLINICAL DATA:  Right upper lobe nodule - persistent cough- hx COPD - hx asthma - hx emphysema - former smoker EXAM: CHEST - 2 VIEW COMPARISON:  07/14/2015 FINDINGS: 6.3 cm focal consolidation in the posterior right upper lobe, progressive since previous exam. Fiducial markers project anterior to the process. Left lung remains clear. Heart size normal. Persistent right middle lobe atelectasis/consolidation. No effusion. No pneumothorax. Visualized skeletal structures are unremarkable. IMPRESSION: 1. Progressive posterior right upper lobe  consolidation.  Electronically Signed   By: Lucrezia Europe M.D.   On: 07/15/2015 19:33   Dg Chest 2 View  07/14/2015  CLINICAL DATA:  Follow-up right lung infiltrate with fatigue, cough, shortness of breath. EXAM: CHEST  2 VIEW COMPARISON:  June 29, 2015 FINDINGS: The heart size and mediastinal contours are within normal limits. There is patchy consolidation of the right upper lobe slightly worse compared to prior exam. The lungs are hyperinflated. There is no pleural effusion. The visualized skeletal structures are unremarkable. IMPRESSION: Right upper lobe consolidation/ pneumonia slightly worse compared to prior exam. Emphysema. Electronically Signed   By: Abelardo Diesel M.D.   On: 07/14/2015 15:28   Dg Chest Port 1 View  08/04/2015  CLINICAL DATA:  81 year old male with shortness of breath EXAM: PORTABLE CHEST 1 VIEW COMPARISON:  07/15/2015 FINDINGS: Single frontal view of  the chest demonstrates emphysematous changes of the lungs. An area of consolidation noted in the right upper lung field measuring approximately 3.2 x 8.3 cm and appears larger in size compared to prior study. Multiple surgical clips noted in the adjacent inferior lung parenchyma. The left lung is clear. There is no pneumothorax. The cardiac silhouette is within - normal limits. The osseous structures are grossly unremarkable. IMPRESSION: Interval progression of the right upper lung field opacity. Follow-up resolution recommended. Electronically Signed   By: Anner Crete M.D.   On: 08/04/2015 21:48     CBC  Recent Labs Lab 08/04/15 2210  WBC 8.3  HGB 10.2*  HCT 33.0*  PLT 231  MCV 90.2  MCH 27.9  MCHC 30.9  RDW 16.4*  LYMPHSABS 0.6*  MONOABS 0.4  EOSABS 0.0  BASOSABS 0.0    Chemistries   Recent Labs Lab 08/04/15 2210  NA 141  K 4.4  CL 104  CO2 24  GLUCOSE 94  BUN 19  CREATININE 0.91  CALCIUM 8.9  AST 21  ALT 14*  ALKPHOS 79  BILITOT 1.4*    ------------------------------------------------------------------------------------------------------------------ estimated creatinine clearance is 36.5 mL/min (by C-G formula based on Cr of 0.91). ------------------------------------------------------------------------------------------------------------------ No results for input(s): HGBA1C in the last 72 hours. ------------------------------------------------------------------------------------------------------------------ No results for input(s): CHOL, HDL, LDLCALC, TRIG, CHOLHDL, LDLDIRECT in the last 72 hours. ------------------------------------------------------------------------------------------------------------------ No results for input(s): TSH, T4TOTAL, T3FREE, THYROIDAB in the last 72 hours.  Invalid input(s): FREET3 ------------------------------------------------------------------------------------------------------------------ No results for input(s): VITAMINB12, FOLATE, FERRITIN, TIBC, IRON, RETICCTPCT in the last 72 hours.  Coagulation profile No results for input(s): INR, PROTIME in the last 168 hours.  No results for input(s): DDIMER in the last 72 hours.  Cardiac Enzymes No results for input(s): CKMB, TROPONINI, MYOGLOBIN in the last 168 hours.  Invalid input(s): CK ------------------------------------------------------------------------------------------------------------------ Invalid input(s): POCBNP   CBG: No results for input(s): GLUCAP in the last 168 hours.       Assessment/Plan Principal Problem: Suspected Healthcare-associated pneumonia: Acute. Patient with complaints of increased cough and sputum production with yellow sputum. Lactic acid elevated at 4.7. Chest x-ray showing worsening right upper lobe opacity which could signify with patient's immunocompromised state a acute pneumonia. -Empiric antibiotics of vancomycin and Tressie Ellis -Follow-up sputum cultures and studies -Repeat lactic  acid -IV fluids normal saline at 75 mL/hr -Mucinex and Hypertonic nebs for help with mobilization of secretions   Malignant neoplasm of upper lobe of right lung [facility]): Patient with known malignancy of the right upper lobe status post chemotherapy and radiation in October 2016. Office telephone note from Dr. Chase Caller state family might to have a referral to palliative care from 08/03/2015. -Recommend consulting palliative care/hospice in a.m. when daughters available to be present  -Social and care management consults  Cough -See above    COPD, end-stage: Chronic. Patient oxygen dependent -Continue 3 L nasal cannula oxygen -Continue substitution Dulera, levalbuterol/ipratropium scheduled  Protein-calorie malnutrition, severe (HCC) - Continue Megace and ensure    BPH (benign prostatic hyperplasia) -Continue Flomax      Constipation -Colace daily   Code Status: DO NOT RESUSCITATE  Family Communication: bedside Disposition Plan: admit   Total time spent 55 minutes.Greater than 50% of this time was spent in counseling, explanation of diagnosis, planning of further management, and coordination of care  [facility] Pager [phone]  If 7PM-7AM, please contact night-coverage [email or web address] Password [email or web address] 08/05/2015, 1:41 AM

## 2015-08-05 NOTE — ED Notes (Signed)
hospitalist at the bedside 

## 2015-08-05 NOTE — Progress Notes (Signed)
Patient admitted after midnight. Chart reviewed. Patient examined. Discussed with Dr. Chase Caller. He had planned on encouraging transition to hospice/comfort care. He recommends palliative consultation. Patient and daughter agree. Have consulted them.  Continue current for now.  Doree Barthel, MD [facility]

## 2015-08-05 NOTE — ED Notes (Signed)
Pt finishing lunch meal tray.

## 2015-08-06 DIAGNOSIS — Z7189 Other specified counseling: Secondary | ICD-10-CM

## 2015-08-06 DIAGNOSIS — Z515 Encounter for palliative care: Secondary | ICD-10-CM

## 2015-08-06 DIAGNOSIS — R0600 Dyspnea, unspecified: Secondary | ICD-10-CM

## 2015-08-06 LAB — LEGIONELLA ANTIGEN, URINE

## 2015-08-06 MED ORDER — LEVALBUTEROL HCL 0.63 MG/3ML IN NEBU
0.6300 mg | INHALATION_SOLUTION | Freq: Three times a day (TID) | RESPIRATORY_TRACT | Status: DC
  Administered 2015-08-06 – 2015-08-07 (×3): 0.63 mg via RESPIRATORY_TRACT
  Filled 2015-08-06 (×4): qty 3

## 2015-08-06 MED ORDER — IPRATROPIUM BROMIDE 0.02 % IN SOLN
0.5000 mg | Freq: Three times a day (TID) | RESPIRATORY_TRACT | Status: DC
  Administered 2015-08-06 – 2015-08-07 (×3): 0.5 mg via RESPIRATORY_TRACT
  Filled 2015-08-06 (×4): qty 2.5

## 2015-08-06 MED ORDER — LEVOFLOXACIN 500 MG PO TABS
500.0000 mg | ORAL_TABLET | ORAL | Status: DC
  Administered 2015-08-06: 500 mg via ORAL
  Filled 2015-08-06: qty 1

## 2015-08-06 NOTE — Consult Note (Signed)
Consultation Note Date: 08/06/2015   Patient Name: Frank Garcia  DOB: 1929-03-17  MRN: [id number]  Age / Sex: 81 y.o., male  PCP: Brand Males, MD Referring Physician: Delfina Redwood, MD  Reason for Consultation: Establishing goals of care and Hospice Evaluation  Clinical Assessment/Narrative: Mr. 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 Home medications including Robitussin and Hycodan syrup without full relief of symptoms. His daughter reports that he did not want to come to the hospital but they do not know of any other options in order to help him feel better. He was planning on enrolling with home hospice but appointment for evaluation for enrollment was not scheduled until today and he was too symptomatic to stay at home until that time.   He reports the most important things are being at home, being comfortable, and spending time with his family.  We talked about care plan that would focus on these goals, and his family are in agreement that he would best be served by plan to transition home with hospice support. He would like to be treated for treatable problems, such as pneumonia, prior to discharge but does not want to be rehospitalized in the future.  He lives with his daughter and granddaughter and plan is for him to return to their home. His family does work and is going to establish caregivers to be with him so he is not at home alone.  Contacts/Participants in Discussion: Patient and his 2 daughters Primary Decision Maker: Patient   Relationship to Patient self  SUMMARY OF RECOMMENDATIONS - I met with Mr. Heiland and his family. - His goal is to complete treatment for any acute  problems during this hospitalization, such as pneumonia, but he then wants to enroll with hospice support at home and a plan not to return to the hospital in the future. - Care management is aware and referral was placed to hospice and palliative care [facility] - He will need equipment delivered prior to discharge home. - Discussed with Dr. Conley Canal  Code Status/Advance Care Planning: DNR    Code Status Orders        Start     Ordered   08/05/15 0442  Do not attempt resuscitation (DNR)   Continuous    Question Answer Comment  In the event of cardiac or respiratory ARREST Do not call a "code blue"   In the event of cardiac or respiratory ARREST Do not perform Intubation, CPR, defibrillation or ACLS   In the event of cardiac or respiratory ARREST Use medication by any route, position, wound care, and other measures to relive pain and suffering. May use oxygen, suction and manual treatment of airway obstruction as needed for comfort.      08/05/15 0442    Advance Directive Documentation        Most Recent Value   Type of Advance Directive  -- [Pt sates he is a DNR]   Pre-existing out of facility DNR order (yellow form or pink MOST form)     "MOST" Form in Place?       Symptom Management:   Shortness of breath: Patient reports this as his only problem at this time. I asked bedside nurse to give him dose of morphine that he is ordered.  He reported  this did improve his symptoms. I recommended he continue this and would plan for discharge with Roxanol for use on as-needed basis. Additionally, we discussed use of bedside fan for stimulation of trigeminal nerve and that this may help with sensation of dyspnea and well.  On discharge, would recommend scripts for: - Morphine Concentrate 72m/0.5ml: 516m(0.2525msublingual every 1 hour as needed for pain or shortness of breath: Disp 16m28mLorazepam 2mg/60mconcentrated solution: 1mg (16mml) s55mingual every 4 hours as needed for anxiety:  Disp 16ml - 33mol 2mg/ml s38mtion: 0.5mg (0.2558m subl16mal every 4 hours as needed for agitation or nausea: Disp 16ml    Pal63mive Prophylaxis:   Bowel Regimen and Frequent Pain Assessment  Additional Recommendations (Limitations, Scope, Preferences):  Avoid Hospitalization   Psycho-social/Spiritual:  Support System: Strong Desire for further Chaplaincy support:no Additional Recommendations: Education on Hospice  Prognosis: Weeks to months at best. His expected prognosis if disease follows natural trajectory is less than 6 months and he therefore would qualify for hospice support.  Discharge Planning: Home with Hospice   Chief Complaint/ Primary Diagnoses: Present on Admission:  . Healthcare-associated pneumonia . Protein-calorie malnutrition, severe (HCC) . CoughGosperCOPD, very severe (HCC) . BPH ([place] prostatic hyperplasia) . Malignant neoplasm of upper lobe of right lung (HCC) . Const[place]um[place]tion  I have reviewed the medical record, interviewed the patient and family, and examined the patient. The following aspects are pertinent.  Past Medical History  Diagnosis Date  . BPH (benign prostatic hypertrophy)     takes Rapaflo daily  . Chronic airway obstruction, not elsewhere classified   . Asthma     uses Spiriva and Dulera daily  . Emphysema lung (HCC)   . Chr[place]c bronchitis (HCC)   . [place] of breath     Albuterol inhaler as needed;lying and exertion  . Pneumonia     "a few times" (08/26/2014)  . Cough     productive but without fever  . Arthritis   . Joint pain   . Cataracts, bilateral     immature  . History of shingles   . Cancer (HCC)   . HypKiptonension 07/15/2015    Pt denies hx of HTN   Social History   Social History  . Marital Status: Married    Spouse Name: N/A  . Number of Children: 4  . Years of Education: 12   Occupational History  . retired     [employer] Main Topics  . Smoking status: Former Smoker -- 1.50  packs/day for 45 years    Types: Cigarettes    Quit date: 04/07/2008  . Smokeless tobacco: Former User    QuitSystems developere: 04/07/2008     Comment: quit smoking 3+yrs ago  . Alcohol Use: No     Comment: social drinker prior to sickness  . Drug Use: No  . Sexual Activity: No   Other Topics Concern  . None   Social History Narrative   Independent in ADLs and IADLs   Walks without assistance   Family History  Problem Relation Age of Onset  . COPD Father   . Heart failure Mother    Scheduled Meds: . docusate sodium  100 mg Oral Daily  . fluticasone  2 spray Each Nare Daily  . guaiFENesin  600 mg Oral BID  . ipratropium  0.5 mg Nebulization TID  . levalbuterol  0.63 mg Nebulization TID  . levofloxacin  500 mg Oral Q24H  . mometasone-formoterol  2 puff  Inhalation BID  . pantoprazole  40 mg Oral Q1200  . sodium chloride  3 mL Intravenous Q12H  . tamsulosin  0.4 mg Oral Daily  . tiotropium  18 mcg Inhalation Daily   Continuous Infusions:  PRN Meds:.acetaminophen **OR** acetaminophen, HYDROcodone-homatropine, morphine injection, ondansetron **OR** ondansetron (ZOFRAN) IV, polyethylene glycol, sodium chloride HYPERTONIC, sodium chloride HYPERTONIC, zolpidem Medications Prior to Admission:  Prior to Admission medications   Medication Sig Start Date End Date Taking? Authorizing Provider  albuterol (PROVENTIL HFA;VENTOLIN HFA) 108 (90 BASE) MCG/ACT inhaler Inhale 2 puffs into the lungs every 4 (four) hours as needed for wheezing or shortness of breath. 01/27/14  Yes Brand Males, MD  albuterol (PROVENTIL) (2.5 MG/3ML) 0.083% nebulizer solution USE 1 VIAL PER NEBULIZER EVERY 2 HOURS AS NEEDED FOR WHEEZING AND SHORTNESS OF BREATH 07/06/15  Yes Brand Males, MD  HYDROcodone-homatropine (HYCODAN) 5-1.5 MG/5ML syrup Take 5 mLs by mouth every 6 (six) hours as needed for cough. 08/03/15  Yes Noralee Space, MD  levalbuterol Penne Lash) 1.25 MG/3ML nebulizer solution Take 0.63 mg by nebulization  every 6 (six) hours as needed for wheezing. Use 3 times daily x 4 days, then every 6 hours as needed. 07/19/15  Yes Eugenie Filler, MD  Magnesium Hydroxide (MILK OF MAGNESIA PO) Take 15-30 mLs by mouth daily as needed (constipation).    Yes Historical Provider, MD  megestrol (MEGACE) 40 MG/ML suspension TAKE 5 MLS (200 MG TOTAL) BY MOUTH 2 (TWO) TIMES DAILY. 07/19/15  Yes Tammy S Parrett, NP  mometasone-formoterol (DULERA) 200-5 MCG/ACT AERO Inhale 2 puffs into the lungs 2 (two) times daily. 03/15/15  Yes Brand Males, MD  pantoprazole (PROTONIX) 40 MG tablet Take 1 tablet (40 mg total) by mouth daily at 12 noon. 06/09/15  Yes Erick Colace, NP  polyethylene glycol (MIRALAX / GLYCOLAX) packet Take 17 g by mouth daily as needed for mild constipation or moderate constipation. Hold for diarrhea 07/19/15  Yes Eugenie Filler, MD  RAPAFLO 8 MG CAPS capsule Take 1 capsule by mouth daily. 03/04/14  Yes Historical Provider, MD  Tiotropium Bromide Monohydrate (SPIRIVA RESPIMAT) 2.5 MCG/ACT AERS Inhale 2 puffs into the lungs daily. 04/09/15  Yes Brand Males, MD  zolpidem (AMBIEN) 5 MG tablet Take 1 tablet (5 mg total) by mouth at bedtime as needed for sleep. 06/09/15  Yes Erick Colace, NP  feeding supplement, ENSURE ENLIVE, (ENSURE ENLIVE) LIQD Take 237 mLs by mouth 3 (three) times daily between meals. 07/19/15   Eugenie Filler, MD  fluticasone (FLONASE) 50 MCG/ACT nasal spray Place 2 sprays into both nostrils daily. Patient taking differently: Place 2 sprays into both nostrils daily as needed for allergies.  01/11/15   Brand Males, MD  guaiFENesin (MUCINEX) 600 MG 12 hr tablet Take 2 tablets (1,200 mg total) by mouth 2 (two) times daily. Take 2 tablets 2 times daily x 4 days then use as needed. Patient not taking: Reported on 08/04/2015 07/19/15   Eugenie Filler, MD  HYDROcodone-acetaminophen (HYCET) 7.5-325 mg/15 ml solution Take 10 mLs by mouth 4 (four) times daily as needed for  moderate pain. Patient not taking: Reported on 07/14/2015 05/26/15   Thea Silversmith, MD  levofloxacin (LEVAQUIN) 750 MG tablet Take 1 tablet (750 mg total) by mouth every other day. Take for 4 days then stop. Patient not taking: Reported on 08/04/2015 07/19/15   Eugenie Filler, MD  Nutritional Supplements (ENSURE HIGH PROTEIN) LIQD Take 1 Bottle by mouth 3 (three) times daily. To be  taken 1 bottle three times a day between meals; not as meal substitution.. 04/12/15   Barton Dubois, MD  predniSONE (DELTASONE) 10 MG tablet Take 1-3 tablets (10-30 mg total) by mouth daily before breakfast. Take 3 tablets (33m) daily x 3 days, then 2 tablets (278m daily x 3 days, then 1 tablet (1070mdaily x 3 days then stop. Patient not taking: Reported on 08/04/2015 07/19/15   DanEugenie FillerD  prochlorperazine (COMPAZINE) 10 MG tablet Take 1 tablet (10 mg total) by mouth every 6 (six) hours as needed for nausea or vomiting. Patient not taking: Reported on 07/15/2015 04/26/15   MohCurt BearsD   No Known Allergies  Review of Systems  Constitutional: Positive for activity change, appetite change and fatigue.  Respiratory: Positive for shortness of breath and wheezing.   Musculoskeletal: Positive for back pain.  Neurological: Positive for weakness.    Physical Exam  Constitutional: He is oriented to person, place, and time.  Elderly cachectic male lying in bed, no significant respiratory distress but some increased work of breathing noted  HENT:  Head: Normocephalic and atraumatic.  Eyes: Right eye exhibits no discharge. Left eye exhibits no discharge. No scleral icterus.  Neck: No JVD present.  Cardiovascular: Normal rate and regular rhythm.   No murmur heard. Respiratory: No stridor. He has no wheezes. He has rales.  GI: He exhibits no distension. There is no tenderness.  Musculoskeletal: He exhibits no edema.  Neurological: He is alert and oriented to person, place, and time. No cranial nerve  deficit.  Skin: Skin is warm and dry. He is not diaphoretic.  Psychiatric: He has a normal mood and affect. Thought content normal.    Vital Signs: BP 119/58 mmHg  Pulse 87  Temp(Src) 98.4 F (36.9 C) (Oral)  Resp 23  Ht 5' 7"  (1.702 m)  Wt 43 kg (94 lb 12.8 oz)  BMI 14.84 kg/m2  SpO2 100%  SpO2: SpO2: 100 % O2 Device:SpO2: 100 % O2 Flow Rate: .O2 Flow Rate (L/min): 5 L/min  IO: Intake/output summary:  Intake/Output Summary (Last 24 hours) at 08/06/15 2048 Last data filed at 08/06/15 1300  Gross per 24 hour  Intake   2343 ml  Output    825 ml  Net   1518 ml    LBM: Last BM Date: 08/04/15 Baseline Weight: Weight: 43 kg (94 lb 12.8 oz) Most recent weight: Weight: 43 kg (94 lb 12.8 oz)      Palliative Assessment/Data:  Flowsheet Rows        Most Recent Value   Intake Tab    Referral Department  Hospitalist   Unit at Time of Referral  ER   Palliative Care Primary Diagnosis  Cancer   Date Notified  08/05/15   Palliative Care Type  Return patient Palliative Care   Reason for referral  Clarify Goals of Care   Date of Admission  08/04/15   Date first seen by Palliative Care  08/06/15   # of days Palliative referral response time  1 Day(s)   # of days IP prior to Palliative referral  1   Clinical Assessment    Palliative Performance Scale Score  50%   Pain Max last 24 hours  2   Pain Min Last 24 hours  0   Psychosocial & Spiritual Assessment    Palliative Care Outcomes       Additional Data Reviewed:  CBC:    Component Value Date/Time   WBC 8.3 08/04/2015 2210  WBC 4.4 06/09/2015 1100   HGB 10.2* 08/04/2015 2210   HGB 8.7* 06/09/2015 1100   HCT 33.0* 08/04/2015 2210   HCT 26.4* 06/09/2015 1100   PLT 231 08/04/2015 2210   PLT 181 06/09/2015 1100   MCV 90.2 08/04/2015 2210   MCV 83.2 06/09/2015 1100   NEUTROABS 7.2 08/04/2015 2210   NEUTROABS 4.1 06/09/2015 1100   LYMPHSABS 0.6* 08/04/2015 2210   LYMPHSABS 0.0* 06/09/2015 1100   MONOABS 0.4 08/04/2015  2210   MONOABS 0.2 06/09/2015 1100   EOSABS 0.0 08/04/2015 2210   EOSABS 0.0 06/09/2015 1100   BASOSABS 0.0 08/04/2015 2210   BASOSABS 0.0 06/09/2015 1100   Comprehensive Metabolic Panel:    Component Value Date/Time   NA 141 08/04/2015 2210   NA 142 06/09/2015 1100   K 4.4 08/04/2015 2210   K 4.3 06/09/2015 1100   CL 104 08/04/2015 2210   CO2 24 08/04/2015 2210   CO2 22 06/09/2015 1100   BUN 19 08/04/2015 2210   BUN 31.8* 06/09/2015 1100   CREATININE 0.91 08/04/2015 2210   CREATININE 1.0 06/09/2015 1100   GLUCOSE 94 08/04/2015 2210   GLUCOSE 128 06/09/2015 1100   CALCIUM 8.9 08/04/2015 2210   CALCIUM 9.0 06/09/2015 1100   AST 21 08/04/2015 2210   AST 22 06/09/2015 1100   ALT 14* 08/04/2015 2210   ALT 13 06/09/2015 1100   ALKPHOS 79 08/04/2015 2210   ALKPHOS 62 06/09/2015 1100   BILITOT 1.4* 08/04/2015 2210   BILITOT 0.61 06/09/2015 1100   PROT 6.6 08/04/2015 2210   PROT 6.6 06/09/2015 1100   ALBUMIN 2.2* 08/04/2015 2210   ALBUMIN 2.8* 06/09/2015 1100     Time In: 1305 Time Out: 1425 Time Total: 80 Greater than 50%  of this time was spent counseling and coordinating care related to the above assessment and plan.  Signed by: Micheline Rough, MD  Micheline Rough, MD  08/06/2015, 8:48 PM  Please contact Palliative Medicine Team phone at [phone] for questions and concerns.

## 2015-08-06 NOTE — Progress Notes (Signed)
Utilization review completed.  

## 2015-08-06 NOTE — Progress Notes (Signed)
Notified by Babette Relic,  CMRN of family request for [facility]osp[facility] and [place] services at home after discharge. Chart and patient information currently under review to confirm hospice eligibility.  Spoke with Frank Garcia at bedside, and spoke with daughter Frank Garcia by telephone to initiate education related to hospice philosophy, services and team approach to care. Family verbalized understanding of the information provided. Per discussion plan is for discharge to home by personal vehicle with daughter on Saturday, August 07, 2015.  Please send signed completed DNR form home with patient.  Patient will need prescriptions for discharge comfort medications.  DME needs discussed and family requested hospital bed, overbed table, wheeled walker, 3 in 1 BSC delivery to the home today.  [facility] has been contacted and is scheduled to deliver DME tonight or early tomorrow morning.  The home address has been verified and is correct in the chart; Daughter Frank Garcia is  to be contacted to arrange time of delivery.  [facility] aware of the above.  Completed discharge summary will need to be faxed to [facility] at [phone] when final.  Please notify [facility] when patient is ready to leave unit at discharge-call [phone].  [facility] information and contact numbers have been given to both the patient and his daughter.  Above information shared with [facility], CMRN.  Please call with any questions.  Thank Frank Garcia  Frank Garcia, [place], [place]  [phone]

## 2015-08-06 NOTE — Progress Notes (Signed)
Courtesy visit   He has significantly declined since I last saw him. ECOG 4 x 2 weeks Msotely sleeps per daugther Eats only some Cachexia worse  A Lung cancer  Copd  Suspect prognosis is several weeks to few months  P Agree with hospice - he is willing to go in that direction I have fu with him 08/19/15 - he will keep it if he is feeling well or else daugther will call and cancel  Dr. Brand Males, M.D., [facility].C.P Pulmonary and Critical Care Medicine Staff Physician [facility] Pager: [phone], If no answer or between  15:00h - 7:00h: call [phone]  08/06/2015 6:14 PM

## 2015-08-06 NOTE — Care Management Note (Signed)
Case Management Note  Patient Details  Name: Frank Garcia MRN: [id number] Date of Birth: 09-26-1928  Subjective/Objective:   Pt to discharge home with [facility] [place] per dtr's choice.  TC to [facility] - referral has already been received and they will follow pt when discharged.                    Expected Discharge Plan:  [place]  Discharge planning Services  CM Consult  HH Arranged:  RN, Social Work [facility] Agency:  [facility] [place] of Service:  Completed, signed off  Girard Cooter, [place] 08/06/2015, 2:57 PM

## 2015-08-06 NOTE — Progress Notes (Signed)
Patient transferred to 6N25 in stable condition. Daughters at bedside. Patient alert and oriented, shows no s/s of acute distress at this time. Oriented to room and call light. No complaints or requests offered. Encouraged to call nurse for any needs.

## 2015-08-06 NOTE — Progress Notes (Signed)
Attempted report to 6N. Will await call back from [place], [place]

## 2015-08-06 NOTE — Progress Notes (Signed)
Gave report to [place], Therapist, sports on 6N. Patient was transported with daughters at bedside. Cell phone and clothing transported with patient. Patient given call light and instructed to call his nurse for any assistance. Food tray was also brought up with patient.  Milford Cage, RN

## 2015-08-06 NOTE — Progress Notes (Signed)
I met with Frank Garcia and his family today.  Goal is for treatment of PNA (reason for this admission) followed by transition home with hospice support.  Wants to enroll with home hospice with [facility] and no plan for future hospitalizations.  Case mgr aware and will place hospice referral.  Patient will need to have bed delivered prior to d/c once he is medically stable for discharge.    Discussed with Dr. Conley Canal.  She will evaluate for appropriateness/timing of transition to oral abx regimen. Full consult to follow.  Micheline Rough, MD [place] Team [phone]

## 2015-08-06 NOTE — Progress Notes (Signed)
[facility] PROGRESS NOTE  Frank Garcia [id number]:[id number] DOB: 1929/01/16 DOA: 08/04/2015 PCP: Brand Males, MD  Assessment/Plan:  Principal Problem:   Healthcare-associated pneumonia Active Problems:   COPD, very severe ([place])   Protein-calorie malnutrition, severe ([place])   BPH (benign prostatic hyperplasia)   Malignant neoplasm of upper lobe of right lung (HCC)   Cough   Constipation  Transitioning to comfort care. Will change abx to po. D/c other noncomfort medications. Transfer to medsurg   HPI/Subjective: Feels better  Objective: Filed Vitals:   08/06/15 0800 08/06/15 1200  BP: 109/52 104/62  Pulse:    Temp: 97.6 F (36.4 C) 97.8 F (36.6 C)  Resp:  16    Intake/Output Summary (Last 24 hours) at 08/06/15 1523 Last data filed at 08/06/15 1057  Gross per 24 hour  Intake   2343 ml  Output    675 ml  Net   1668 ml   Filed Weights   08/05/15 1449  Weight: 43 kg (94 lb 12.8 oz)    Exam:   General:  Asleep. Arousable. comfortable  Cardiovascular: RRR  Respiratory: CTA  Abdomen: S, nt  Ext: no CCE  Basic Metabolic Panel:  Recent Labs Lab 08/04/15 2210  NA 141  K 4.4  CL 104  CO2 24  GLUCOSE 94  BUN 19  CREATININE 0.91  CALCIUM 8.9   Liver Function Tests:  Recent Labs Lab 08/04/15 2210  AST 21  ALT 14*  ALKPHOS 79  BILITOT 1.4*  PROT 6.6  ALBUMIN 2.2*   No results for input(s): LIPASE, AMYLASE in the last 168 hours. No results for input(s): AMMONIA in the last 168 hours. CBC:  Recent Labs Lab 08/04/15 2210  WBC 8.3  NEUTROABS 7.2  HGB 10.2*  HCT 33.0*  MCV 90.2  PLT 231   Cardiac Enzymes: No results for input(s): CKTOTAL, CKMB, CKMBINDEX, TROPONINI in the last 168 hours. BNP (last 3 results)  Recent Labs  08/26/14 1527 04/11/15 0011 06/07/15 1755  BNP 46.7 29.7 56.7    ProBNP (last 3 results) No results for input(s): PROBNP in the last 8760 hours.  CBG: No results for input(s): GLUCAP in the last 168  hours.  Recent Results (from the past 240 hour(s))  Culture, blood (routine x 2) Call MD if unable to obtain prior to antibiotics being given     Status: None (Preliminary result)   Collection Time: 08/05/15  5:05 AM  Result Value Ref Range Status   Specimen Description BLOOD RIGHT ARM  Final   Special Requests BOTTLES DRAWN AEROBIC AND ANAEROBIC 10ML  Final   Culture NO GROWTH 1 DAY  Final   Report Status PENDING  Incomplete  Culture, blood (routine x 2) Call MD if unable to obtain prior to antibiotics being given     Status: None (Preliminary result)   Collection Time: 08/05/15  5:10 AM  Result Value Ref Range Status   Specimen Description BLOOD RIGHT HAND  Final   Special Requests BOTTLES DRAWN AEROBIC ONLY 5 ML IMMUNOCOMPROMISED  Final   Culture NO GROWTH 1 DAY  Final   Report Status PENDING  Incomplete  Culture, sputum-assessment     Status: None   Collection Time: 08/05/15  5:18 AM  Result Value Ref Range Status   Specimen Description EXPECTORATED SPUTUM  Final   Special Requests Immunocompromised  Final   Sputum evaluation   Final    MICROSCOPIC FINDINGS SUGGEST THAT THIS SPECIMEN IS NOT REPRESENTATIVE OF LOWER RESPIRATORY SECRETIONS. PLEASE RECOLLECT. CRITICAL  RESULT CALLED TO, READ BACK BY AND VERIFIED WITH: JASMINE '@0635'$  07/13/51 MKELLY    Report Status 08/05/2015 FINAL  Final  MRSA PCR Screening     Status: None   Collection Time: 08/05/15  2:51 PM  Result Value Ref Range Status   MRSA by PCR NEGATIVE NEGATIVE Final    Comment:        The GeneXpert MRSA Assay (FDA approved for NASAL specimens only), is one component of a comprehensive MRSA colonization surveillance program. It is not intended to diagnose MRSA infection nor to guide or monitor treatment for MRSA infections.      Studies: Dg Chest Port 1 View  08/04/2015  CLINICAL DATA:  81 year old male with shortness of breath EXAM: PORTABLE CHEST 1 VIEW COMPARISON:  07/15/2015 FINDINGS: Single frontal view of  the chest demonstrates emphysematous changes of the lungs. An area of consolidation noted in the right upper lung field measuring approximately 3.2 x 8.3 cm and appears larger in size compared to prior study. Multiple surgical clips noted in the adjacent inferior lung parenchyma. The left lung is clear. There is no pneumothorax. The cardiac silhouette is within - normal limits. The osseous structures are grossly unremarkable. IMPRESSION: Interval progression of the right upper lung field opacity. Follow-up resolution recommended. Electronically Signed   By: Anner Crete M.D.   On: 08/04/2015 21:48    Scheduled Meds: . cefTAZidime (FORTAZ)  IV  1 g Intravenous Q12H  . docusate sodium  100 mg Oral Daily  . enoxaparin (LOVENOX) injection  20 mg Subcutaneous Q24H  . feeding supplement (ENSURE ENLIVE)  237 mL Oral TID BM  . fluticasone  2 spray Each Nare Daily  . guaiFENesin  600 mg Oral BID  . ipratropium  0.5 mg Nebulization TID  . levalbuterol  0.63 mg Nebulization TID  . megestrol  200 mg Oral BID  . mometasone-formoterol  2 puff Inhalation BID  . pantoprazole  40 mg Oral Q1200  . sodium chloride  3 mL Intravenous Q12H  . tamsulosin  0.4 mg Oral Daily  . tiotropium  18 mcg Inhalation Daily  . vancomycin  750 mg Intravenous Q24H   Continuous Infusions: . sodium chloride    . sodium chloride 75 mL/hr at 08/05/15 0248    Time spent: 35 minutes  [facility] Pager [phone]. If 7PM-7AM, please contact night-coverage at [email or web address], password [place] 08/06/2015, 3:23 PM  LOS: 1 day

## 2015-08-06 NOTE — Care Management Note (Signed)
Case Management Note  Patient Details  Name: Frank Garcia MRN: [id number] Date of Birth: August 01, 1929  Subjective/Objective:   Spoke with dtr who requests equipment for anticipated discharge tomorrow.                 Expected Discharge Plan:  [place] Consult  DME Arranged:  Hospital bed, Overbed table, 3-N-1 DME Agency:  [place]:  RN, Social Work [place] Agency:  Hospice and [facility] of Service:  Completed, signed off  Girard Cooter, [place] 08/06/2015, 3:09 PM

## 2015-08-07 DIAGNOSIS — C3411 Malignant neoplasm of upper lobe, right bronchus or lung: Secondary | ICD-10-CM

## 2015-08-07 DIAGNOSIS — J449 Chronic obstructive pulmonary disease, unspecified: Secondary | ICD-10-CM

## 2015-08-07 DIAGNOSIS — E43 Unspecified severe protein-calorie malnutrition: Secondary | ICD-10-CM

## 2015-08-07 MED ORDER — LEVOFLOXACIN 500 MG PO TABS: 500.0000 mg | ORAL_TABLET | ORAL | Status: AC

## 2015-08-07 MED ORDER — MORPHINE SULFATE (CONCENTRATE) 10 MG /0.5 ML PO SOLN: 10.0000 mg | ORAL | Status: AC | PRN

## 2015-08-07 NOTE — Progress Notes (Signed)
Pt scheduled for discharge this morning. Currently sitting out in chair, chair alarm in place. No concerns to report at this time

## 2015-08-07 NOTE — Progress Notes (Signed)
Daughter called and notified that pt is ready to be picked up from hospital. She said that she will be here in a few minutes

## 2015-08-07 NOTE — Discharge Summary (Signed)
Physician Discharge Summary  Frank Garcia SWN:[id number] DOB: October 21, 1928 DOA: 08/04/2015  PCP: Brand Males, MD  Admit date: 08/04/2015 Discharge date: 08/07/2015  Time spent: greater than 30 minutes  Recommendations for Outpatient Follow-up:  1. Home with hospice   Discharge Diagnoses:  Principal Problem:   Healthcare-associated pneumonia Active Problems:   COPD, end stage   Protein-calorie malnutrition, severe (HCC)   BPH (benign prostatic hyperplasia)   Malignant neoplasm of upper lobe of right lung ([place])  Discharge Condition: stable  Diet recommendation: general  Code status: DNR, do not rehospitalize  [employer]   08/05/15 1449  Weight: 43 kg (94 lb 12.8 oz)    History of present illness:  81 year old male with a past medical history significant for end-stage COPD, oxygen dependent on 3 L adenocarcinoma of the right upper lung s/p chemotherapy and radiation in October; who presents for cough. Patient notes that the cough gradually worsened over the last 5 days. Cough is productive with yellow sputum. Patient notes difficulty clearing secretions at times. Trying home medications including Robitussin and Hycodan syrup without full relief of symptoms. Patient denies any fever, nausea, vomiting, abdominal pain, chest pain, or hemoptysis. Patient diagnosed with possible healthcare acquired pneumonia 07/15/2015 for which patient stated [facility] [facility] until 07/19/2015 discharged home with 4 days of Levaquin.  Discussed with patient and family regarding patient's CODE STATUS. Patient states that when the good [facility] is ready to take him it is his time to go. Daughters at bedside also agreed that patient should be a DO NOT RESUSCITATE.  Patient notes that he's had a cough with sputum production has been progressively worsening over the last 5 days.  Upon admission into the hospital a chest x-ray revealed interval worsening of right upper lobe opacity. However he had an elevated  lactate at 4.7 which gave concern for possibility of a acute infection giving his immunocompromised history  Hospital Course:  Admitted to stepdown. Started on HCAP coverage, fluid rescussitated. Palliative care consulted. Patient and family would like to go home with hospice and complete a course of antibiotics at home. Prognosis likely a few weeks  Procedures:  none  Consultations:  Palliative care  Discharge Exam: Filed Vitals:   08/06/15 2130 08/07/15 0535  BP: 101/61 104/57  Pulse: 117 106  Temp: 98.8 F (37.1 C) 98.3 F (36.8 C)  Resp: 21 20    General: asleep. Arousable. Appears comfortable Cardiovascular: RRR Respiratory: CTA Ext no CCE  Discharge Instructions   Discharge Instructions    Diet general    Complete by:  As directed      Walk with assistance    Complete by:  As directed           Current Discharge Medication List    START taking these medications   Details  Morphine Sulfate (MORPHINE CONCENTRATE) 10 mg / 0.5 ml concentrated solution Take 0.5 mLs (10 mg total) by mouth every 3 (three) hours as needed for moderate pain, anxiety or shortness of breath. Qty: 30 mL, Refills: 0      CONTINUE these medications which have CHANGED   Details  levofloxacin (LEVAQUIN) 500 MG tablet Take 1 tablet (500 mg total) by mouth daily. Qty: 3 tablet, Refills: 0      CONTINUE these medications which have NOT CHANGED   Details  albuterol (PROVENTIL HFA;VENTOLIN HFA) 108 (90 BASE) MCG/ACT inhaler Inhale 2 puffs into the lungs every 4 (four) hours as needed for wheezing or shortness of breath. Qty: 1 Inhaler, Refills:  3    albuterol (PROVENTIL) (2.5 MG/3ML) 0.083% nebulizer solution USE 1 VIAL PER NEBULIZER EVERY 2 HOURS AS NEEDED FOR WHEEZING AND SHORTNESS OF BREATH Qty: 600 mL, Refills: 0    HYDROcodone-homatropine (HYCODAN) 5-1.5 MG/5ML syrup Take 5 mLs by mouth every 6 (six) hours as needed for cough. Qty: 180 mL, Refills: 0    levalbuterol (XOPENEX)  1.25 MG/3ML nebulizer solution Take 0.63 mg by nebulization every 6 (six) hours as needed for wheezing. Use 3 times daily x 4 days, then every 6 hours as needed. Qty: 72 mL, Refills: 4    Magnesium Hydroxide (MILK OF MAGNESIA PO) Take 15-30 mLs by mouth daily as needed (constipation).     megestrol (MEGACE) 40 MG/ML suspension TAKE 5 MLS (200 MG TOTAL) BY MOUTH 2 (TWO) TIMES DAILY. Qty: 240 mL, Refills: 0    mometasone-formoterol (DULERA) 200-5 MCG/ACT AERO Inhale 2 puffs into the lungs 2 (two) times daily. Qty: 2 Inhaler, Refills: 0    pantoprazole (PROTONIX) 40 MG tablet Take 1 tablet (40 mg total) by mouth daily at 12 noon. Qty: 60 tablet, Refills: 3    polyethylene glycol (MIRALAX / GLYCOLAX) packet Take 17 g by mouth daily as needed for mild constipation or moderate constipation. Hold for diarrhea Qty: 28 each, Refills: 0    RAPAFLO 8 MG CAPS capsule Take 1 capsule by mouth daily.    Tiotropium Bromide Monohydrate (SPIRIVA RESPIMAT) 2.5 MCG/ACT AERS Inhale 2 puffs into the lungs daily. Qty: 3 Inhaler, Refills: 1    zolpidem (AMBIEN) 5 MG tablet Take 1 tablet (5 mg total) by mouth at bedtime as needed for sleep. Qty: 30 tablet, Refills: 0    fluticasone (FLONASE) 50 MCG/ACT nasal spray Place 2 sprays into both nostrils daily. Qty: 16 g, Refills: 5    guaiFENesin (MUCINEX) 600 MG 12 hr tablet Take 2 tablets (1,200 mg total) by mouth 2 (two) times daily. Take 2 tablets 2 times daily x 4 days then use as needed. Qty: 30 tablet, Refills: 0    Nutritional Supplements (ENSURE HIGH PROTEIN) LIQD Take 1 Bottle by mouth 3 (three) times daily. To be taken 1 bottle three times a day between meals; not as meal substitution.Otho Darner: 99242 mL, Refills: 3      STOP taking these medications     HYDROcodone-acetaminophen (HYCET) 7.5-325 mg/15 ml solution      predniSONE (DELTASONE) 10 MG tablet      prochlorperazine (COMPAZINE) 10 MG tablet        No Known Allergies    The results  of significant diagnostics from this hospitalization (including imaging, microbiology, ancillary and laboratory) are listed below for reference.    Significant Diagnostic Studies: Dg Chest 2 View  07/15/2015  CLINICAL DATA:  Right upper lobe nodule - persistent cough- hx COPD - hx asthma - hx emphysema - former smoker EXAM: CHEST - 2 VIEW COMPARISON:  07/14/2015 FINDINGS: 6.3 cm focal consolidation in the posterior right upper lobe, progressive since previous exam. Fiducial markers project anterior to the process. Left lung remains clear. Heart size normal. Persistent right middle lobe atelectasis/consolidation. No effusion. No pneumothorax. Visualized skeletal structures are unremarkable. IMPRESSION: 1. Progressive posterior right upper lobe  consolidation. Electronically Signed   By: Lucrezia Europe M.D.   On: 07/15/2015 19:33   Dg Chest 2 View  07/14/2015  CLINICAL DATA:  Follow-up right lung infiltrate with fatigue, cough, shortness of breath. EXAM: CHEST  2 VIEW COMPARISON:  June 29, 2015 FINDINGS: The heart  size and mediastinal contours are within normal limits. There is patchy consolidation of the right upper lobe slightly worse compared to prior exam. The lungs are hyperinflated. There is no pleural effusion. The visualized skeletal structures are unremarkable. IMPRESSION: Right upper lobe consolidation/ pneumonia slightly worse compared to prior exam. Emphysema. Electronically Signed   By: Abelardo Diesel M.D.   On: 07/14/2015 15:28   Dg Chest Port 1 View  08/04/2015  CLINICAL DATA:  81 year old male with shortness of breath EXAM: PORTABLE CHEST 1 VIEW COMPARISON:  07/15/2015 FINDINGS: Single frontal view of the chest demonstrates emphysematous changes of the lungs. An area of consolidation noted in the right upper lung field measuring approximately 3.2 x 8.3 cm and appears larger in size compared to prior study. Multiple surgical clips noted in the adjacent inferior lung parenchyma. The left lung  is clear. There is no pneumothorax. The cardiac silhouette is within - normal limits. The osseous structures are grossly unremarkable. IMPRESSION: Interval progression of the right upper lung field opacity. Follow-up resolution recommended. Electronically Signed   By: Anner Crete M.D.   On: 08/04/2015 21:48    Microbiology: Recent Results (from the past 240 hour(s))  Culture, blood (routine x 2) Call MD if unable to obtain prior to antibiotics being given     Status: None (Preliminary result)   Collection Time: 08/05/15  5:05 AM  Result Value Ref Range Status   Specimen Description BLOOD RIGHT ARM  Final   Special Requests BOTTLES DRAWN AEROBIC AND ANAEROBIC 10ML  Final   Culture NO GROWTH 1 DAY  Final   Report Status PENDING  Incomplete  Culture, blood (routine x 2) Call MD if unable to obtain prior to antibiotics being given     Status: None (Preliminary result)   Collection Time: 08/05/15  5:10 AM  Result Value Ref Range Status   Specimen Description BLOOD RIGHT HAND  Final   Special Requests BOTTLES DRAWN AEROBIC ONLY 5 ML IMMUNOCOMPROMISED  Final   Culture NO GROWTH 1 DAY  Final   Report Status PENDING  Incomplete  Culture, sputum-assessment     Status: None   Collection Time: 08/05/15  5:18 AM  Result Value Ref Range Status   Specimen Description EXPECTORATED SPUTUM  Final   Special Requests Immunocompromised  Final   Sputum evaluation   Final    MICROSCOPIC FINDINGS SUGGEST THAT THIS SPECIMEN IS NOT REPRESENTATIVE OF LOWER RESPIRATORY SECRETIONS. PLEASE RECOLLECT. CRITICAL RESULT CALLED TO, READ BACK BY AND VERIFIED WITH: JASMINE '@0635'$  08/05/15 MKELLY    Report Status 08/05/2015 FINAL  Final  MRSA PCR Screening     Status: None   Collection Time: 08/05/15  2:51 PM  Result Value Ref Range Status   MRSA by PCR NEGATIVE NEGATIVE Final    Comment:        The GeneXpert MRSA Assay (FDA approved for NASAL specimens only), is one component of a comprehensive MRSA  colonization surveillance program. It is not intended to diagnose MRSA infection nor to guide or monitor treatment for MRSA infections.      Labs: Basic Metabolic Panel:  Recent Labs Lab 08/04/15 2210  NA 141  K 4.4  CL 104  CO2 24  GLUCOSE 94  BUN 19  CREATININE 0.91  CALCIUM 8.9   Liver Function Tests:  Recent Labs Lab 08/04/15 2210  AST 21  ALT 14*  ALKPHOS 79  BILITOT 1.4*  PROT 6.6  ALBUMIN 2.2*   No results for input(s): LIPASE, AMYLASE in  the last 168 hours. No results for input(s): AMMONIA in the last 168 hours. CBC:  Recent Labs Lab 08/04/15 2210  WBC 8.3  NEUTROABS 7.2  HGB 10.2*  HCT 33.0*  MCV 90.2  PLT 231   Cardiac Enzymes: No results for input(s): CKTOTAL, CKMB, CKMBINDEX, TROPONINI in the last 168 hours. BNP: BNP (last 3 results)  Recent Labs  08/26/14 1527 04/11/15 0011 06/07/15 1755  BNP 46.7 29.7 56.7    ProBNP (last 3 results) No results for input(s): PROBNP in the last 8760 hours.  CBG: No results for input(s): GLUCAP in the last 168 hours.     SignedDelfina Redwood  [facility] 08/07/2015, 8:32 AM

## 2015-08-08 ENCOUNTER — Telehealth: Payer: Self-pay | Admitting: Internal Medicine

## 2015-08-08 NOTE — Telephone Encounter (Signed)
Frank Garcia is under hospice care now. He has OV with me 08/19/15 I believe. Some few days before that call his daughter and if he in poor shape he need not see me. Do not over book that day  Please  Thanks  Dr. Brand Males, M.D., [facility].C.P Pulmonary and Critical Care Medicine Staff Physician [facility] Pager: [phone], If no answer or between  15:00h - 7:00h: call [phone]  08/08/2015 11:25 PM

## 2015-08-10 ENCOUNTER — Inpatient Hospital Stay: Payer: Medicare Other | Admitting: Adult Health

## 2015-08-10 LAB — CULTURE, BLOOD (ROUTINE X 2)
Culture: NO GROWTH
Culture: NO GROWTH

## 2015-08-11 ENCOUNTER — Telehealth: Payer: Self-pay | Admitting: Internal Medicine

## 2015-08-11 NOTE — Telephone Encounter (Signed)
Called and spoke with Ulice Dash at [facility] stated that pt is steadily declining at home and home health hospice feels that they can no longer manage pt's symptoms at home and are requesting an order for pt to be moved to [facility] for end of life care Ulice Dash states that pt is getting round the clock morphine for pain and o2 sats are staying in the 80's% even with oxygen therapy Pt is preparing for end of life for pt  Ulice Dash was requesting the order be given today before 5pm Informed Ulice Dash that MR is not in office and is unreachable since he is doing [facility] stated that she would try to contact Hospice's dr to see if they could get order before 5pm today  Ulice Dash stated she would call office back to inform of whether she could get order  Will hold message in triage to ensure follow up on this message

## 2015-08-11 NOTE — Telephone Encounter (Signed)
Spoke with Onjye from Hospice. States that the pt is declining quickly. The pt's family requests that a DNR be signed. Onjye needs a verbal from [place] for Hospice to be able to take the DNR form to the pt's house. I have given her this verbal. Nothing further was needed.

## 2015-08-12 NOTE — Telephone Encounter (Signed)
Called and spoke to pt's daughter, Kenney Houseman. Kenney Houseman stated the pt will not be able to make appt, he's currently at [facility].   Will forward to MR as FYI.

## 2015-08-13 NOTE — Telephone Encounter (Signed)
Ok thanks. Will close note

## 2015-08-16 NOTE — Telephone Encounter (Signed)
Cancel 08/19/15 OV. Do not replace it with someone else

## 2015-08-17 ENCOUNTER — Telehealth: Payer: Self-pay | Admitting: Internal Medicine

## 2015-08-17 NOTE — Telephone Encounter (Signed)
Frank Garcia died 09-11-15. pls get condolence card mailed out

## 2015-08-18 NOTE — Telephone Encounter (Signed)
Condolence card has been placed in MR's look-ats. Will place in out going mail when signed. Nothing further needed at this time.

## 2015-08-19 ENCOUNTER — Ambulatory Visit: Payer: Medicare Other | Admitting: Internal Medicine

## 2015-08-26 ENCOUNTER — Telehealth: Payer: Self-pay

## 2015-08-26 NOTE — Telephone Encounter (Signed)
On 08/26/2015 I received a death certificate from [place]. The death certificate is for burial. The patient is a patient of Doctor Ramaswamy. The death certificate will be taken to [facility] on Tuesday (January 3rd) due to [place] being on vacation until then. On 09/16/15 I received the death certificate back from Doctor Ramaswamy. I got the death certificate ready and called the funeral home to let them know the death certificate is ready for pickup.

## 2015-08-29 DEATH — deceased

## 2015-10-06 ENCOUNTER — Other Ambulatory Visit: Payer: Medicare Other

## 2015-10-13 ENCOUNTER — Ambulatory Visit: Payer: Medicare Other | Admitting: Internal Medicine

## 2016-02-15 ENCOUNTER — Other Ambulatory Visit: Payer: Self-pay | Admitting: Nurse Practitioner

## 2016-07-27 IMAGING — DX DG CHEST 2V
2 series · 2 of 2 positions shown · non-contrast
Comparison: 06/07/2015

CLINICAL DATA: Follow-up pneumonia, shortness of breath

EXAM:
CHEST  2 VIEW

[chest pa]
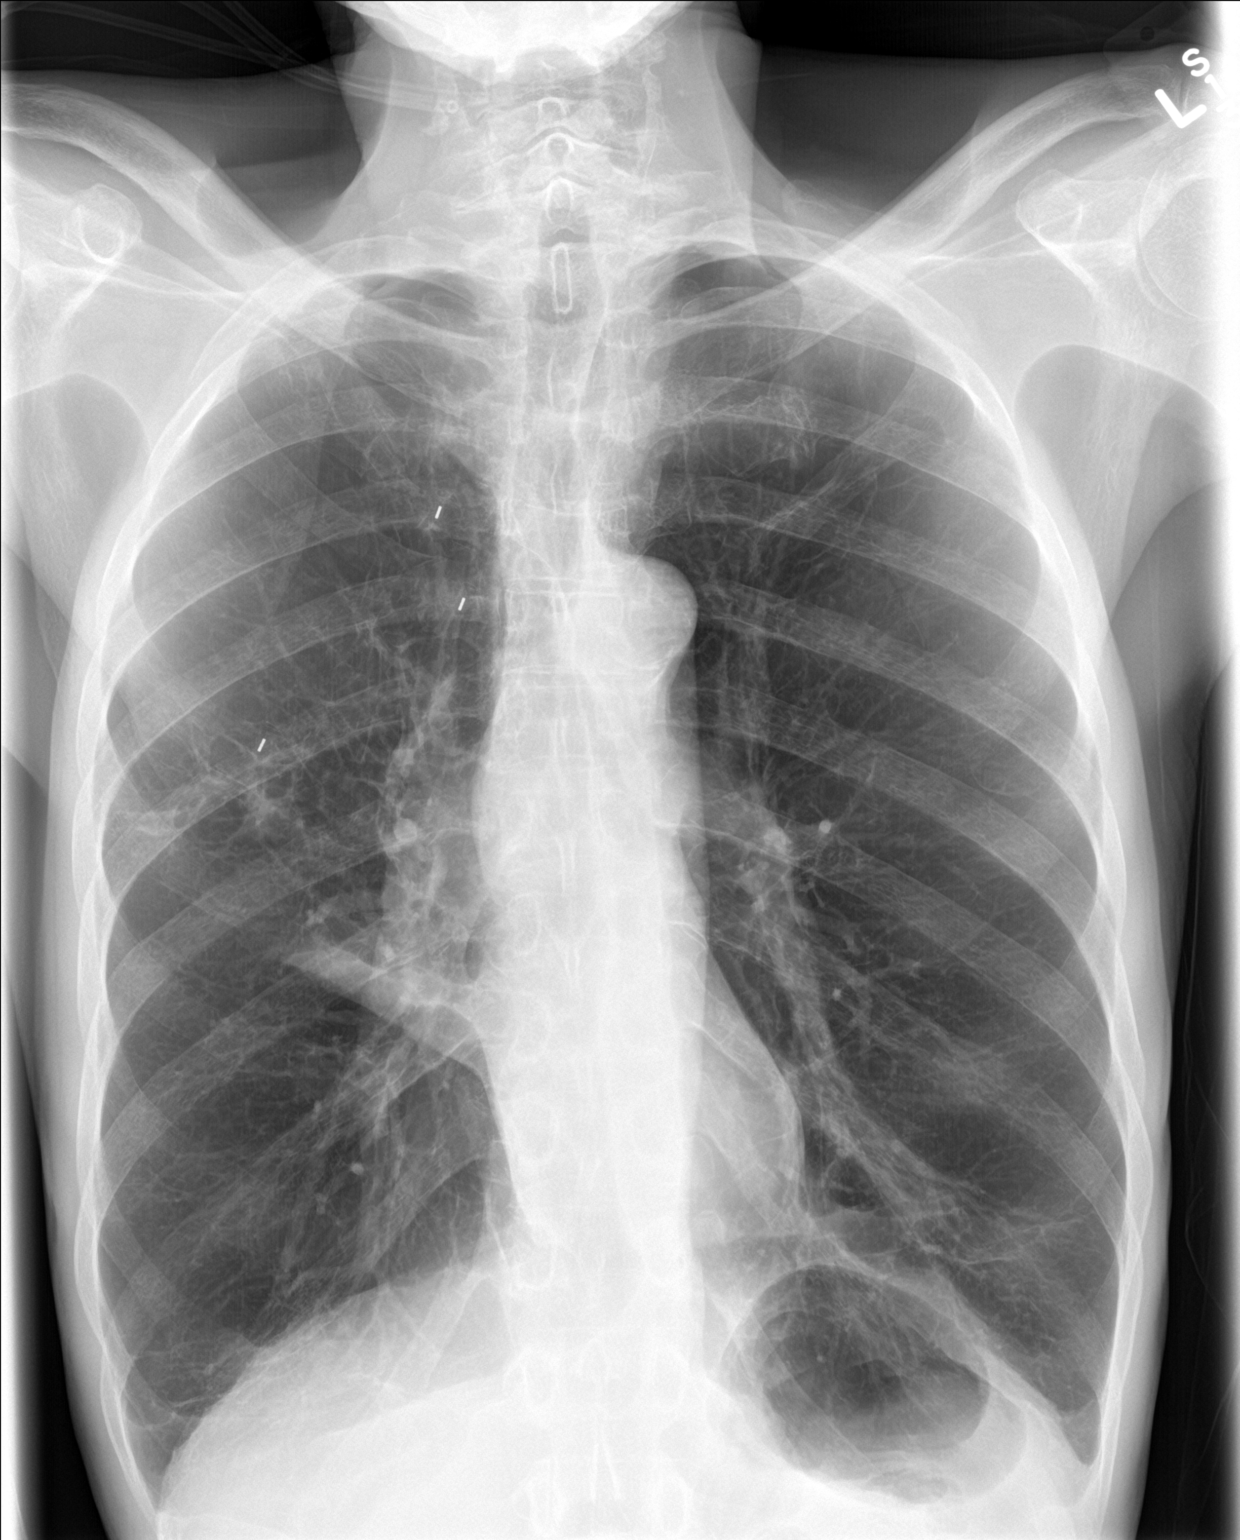

[chest lat]
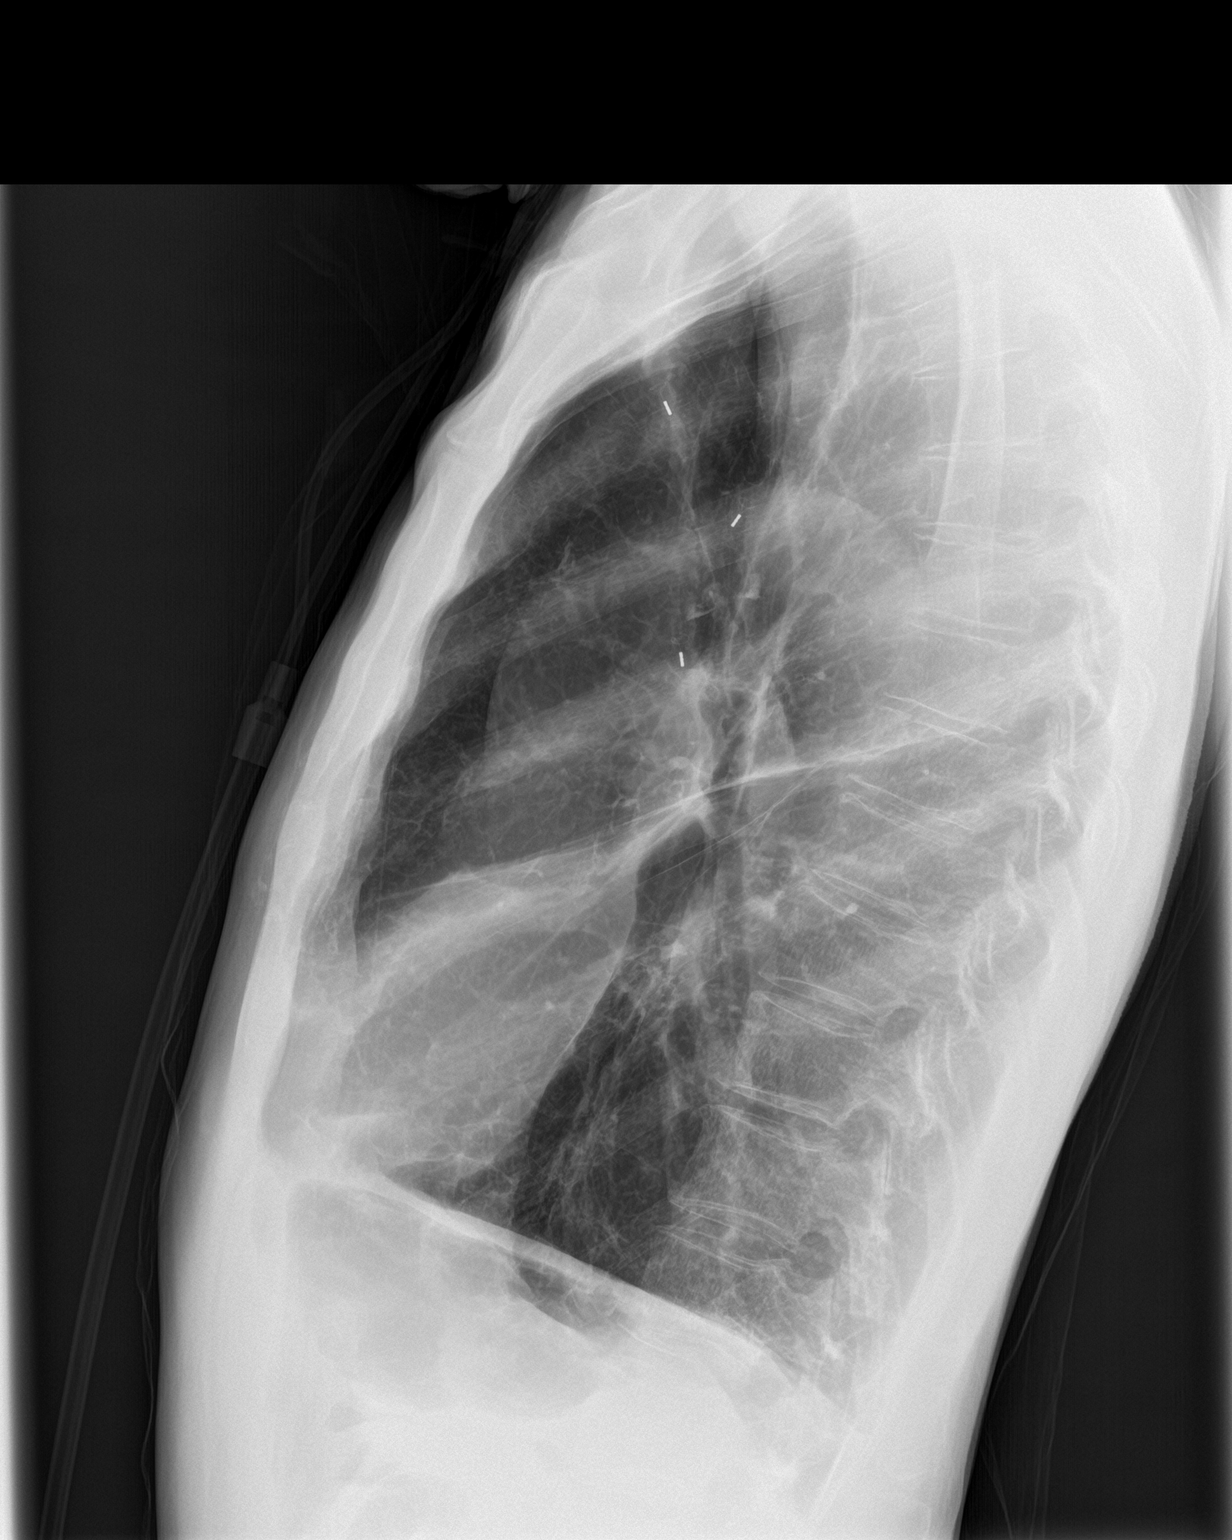

[2 of 2 positions shown; findings below may reference images not displayed]

FINDINGS: Improving right upper lobe opacity/pneumonia. Mild bibasilar
scarring/atelectasis. No pleural effusion or pneumothorax.

Underlying hyperinflation/ emphysematous changes.

The heart is normal in size.

Mild degenerative changes of the visualized thoracolumbar spine.
IMPRESSION: Improving right upper lobe opacity/pneumonia.

## 2016-08-26 IMAGING — DX DG CHEST 2V
2 series · 3 of 3 positions shown · non-contrast
Comparison: 07/14/2015

CLINICAL DATA: Right upper lobe nodule - persistent cough- hx COPD
- hx asthma - hx emphysema - former smoker

EXAM:
CHEST - 2 VIEW

[chest lat]
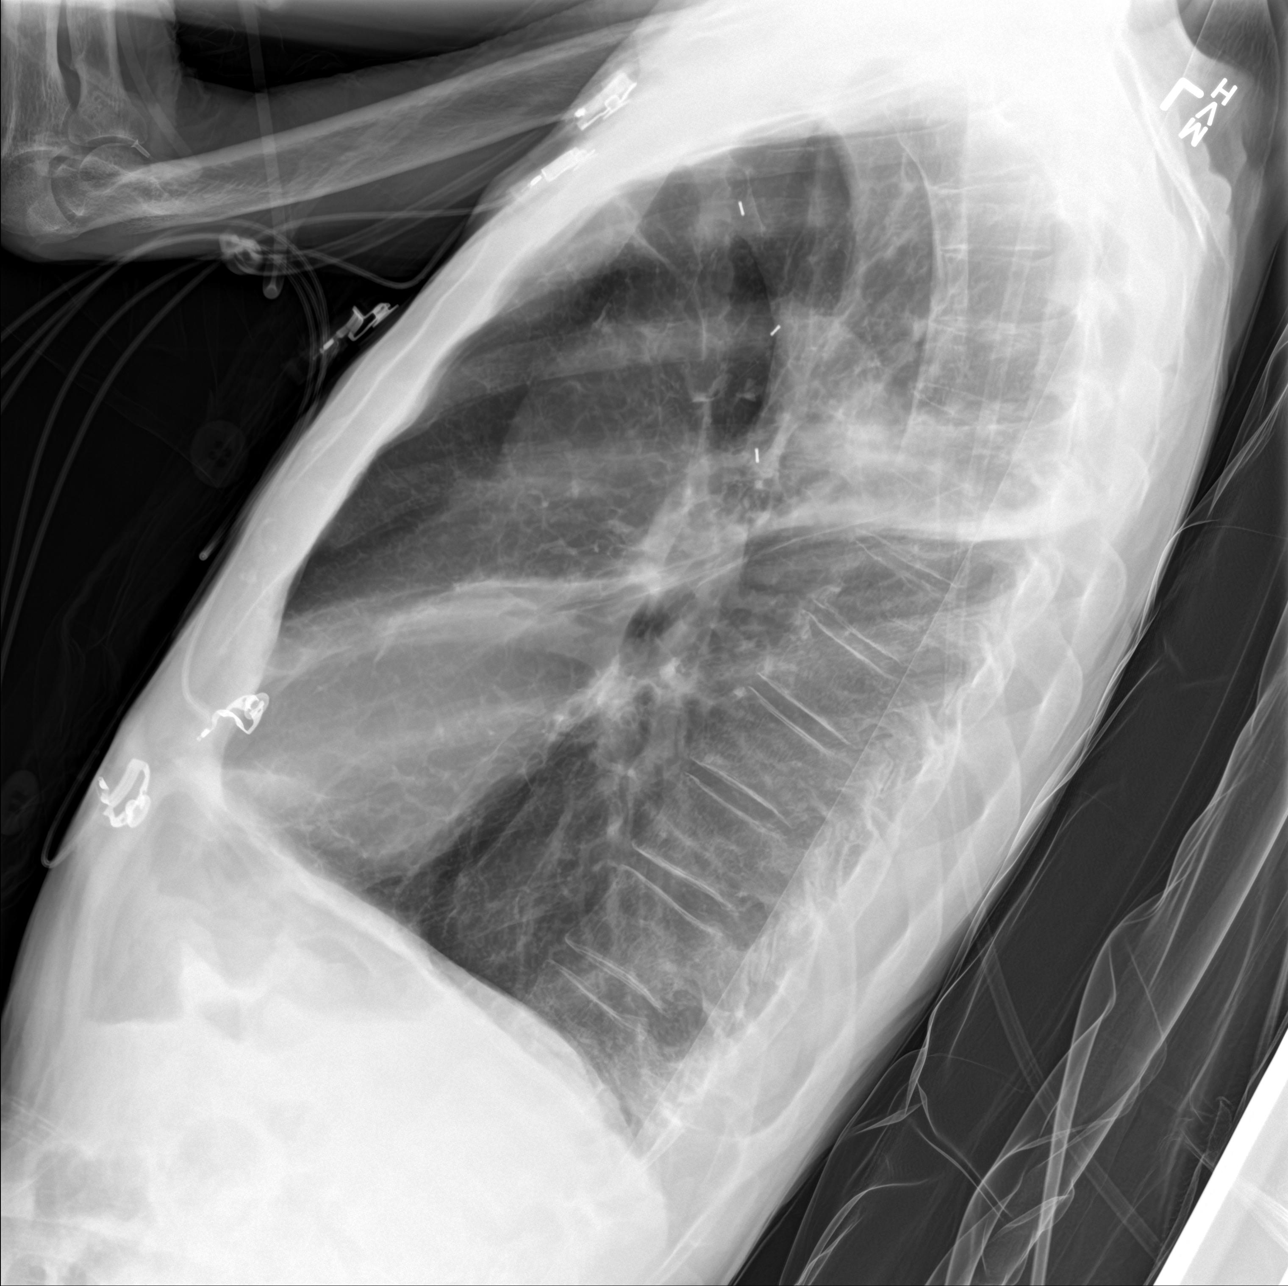

[Series 3: chest ap · 0.14mm/px · 2 of 2 slices shown]
[im 1/2]
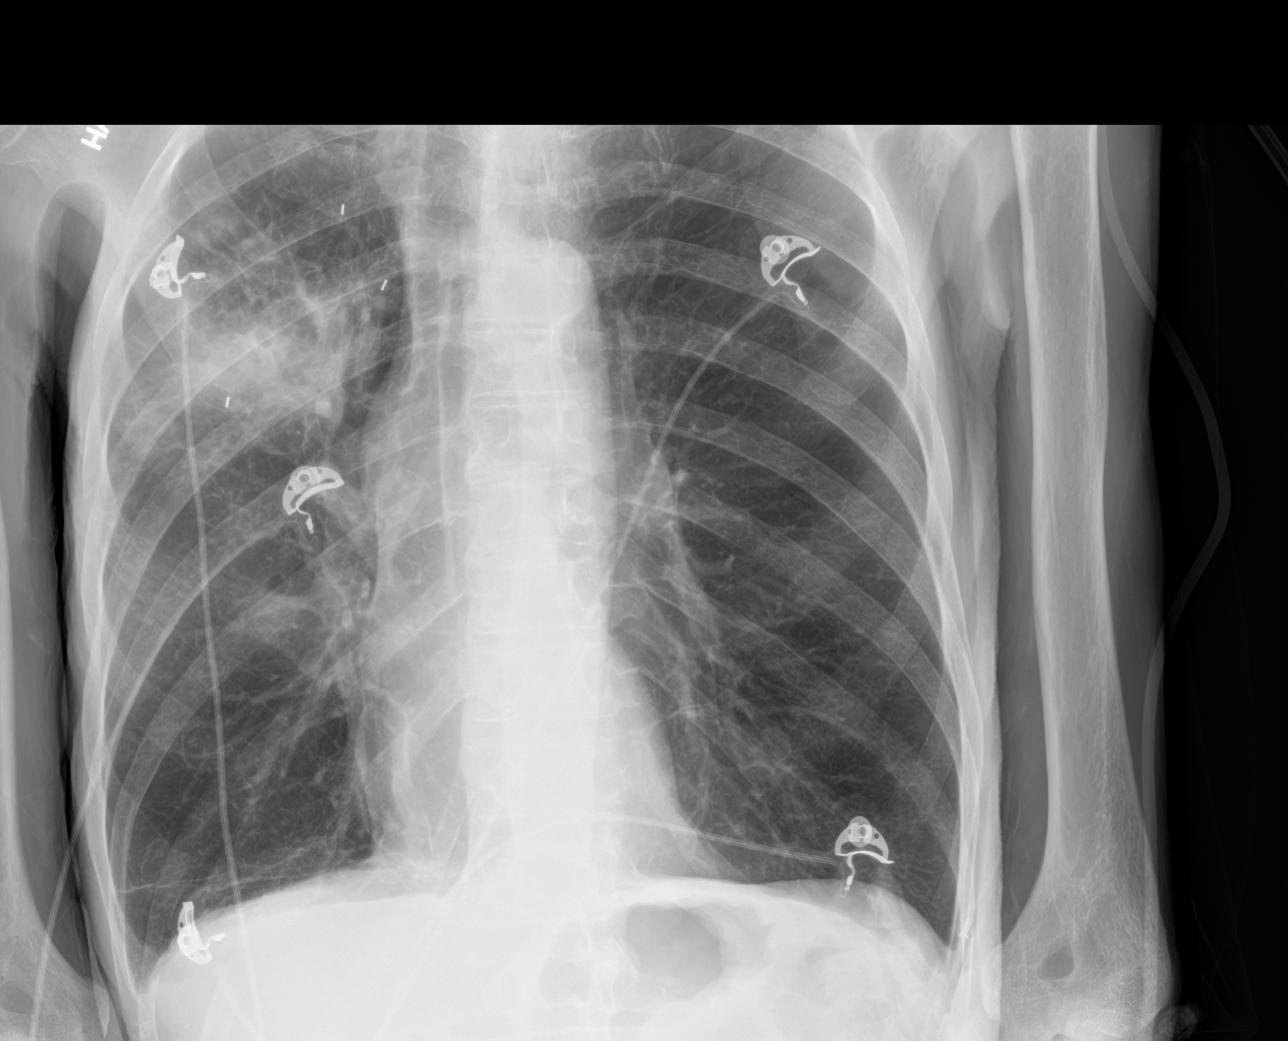
[im 2/2]
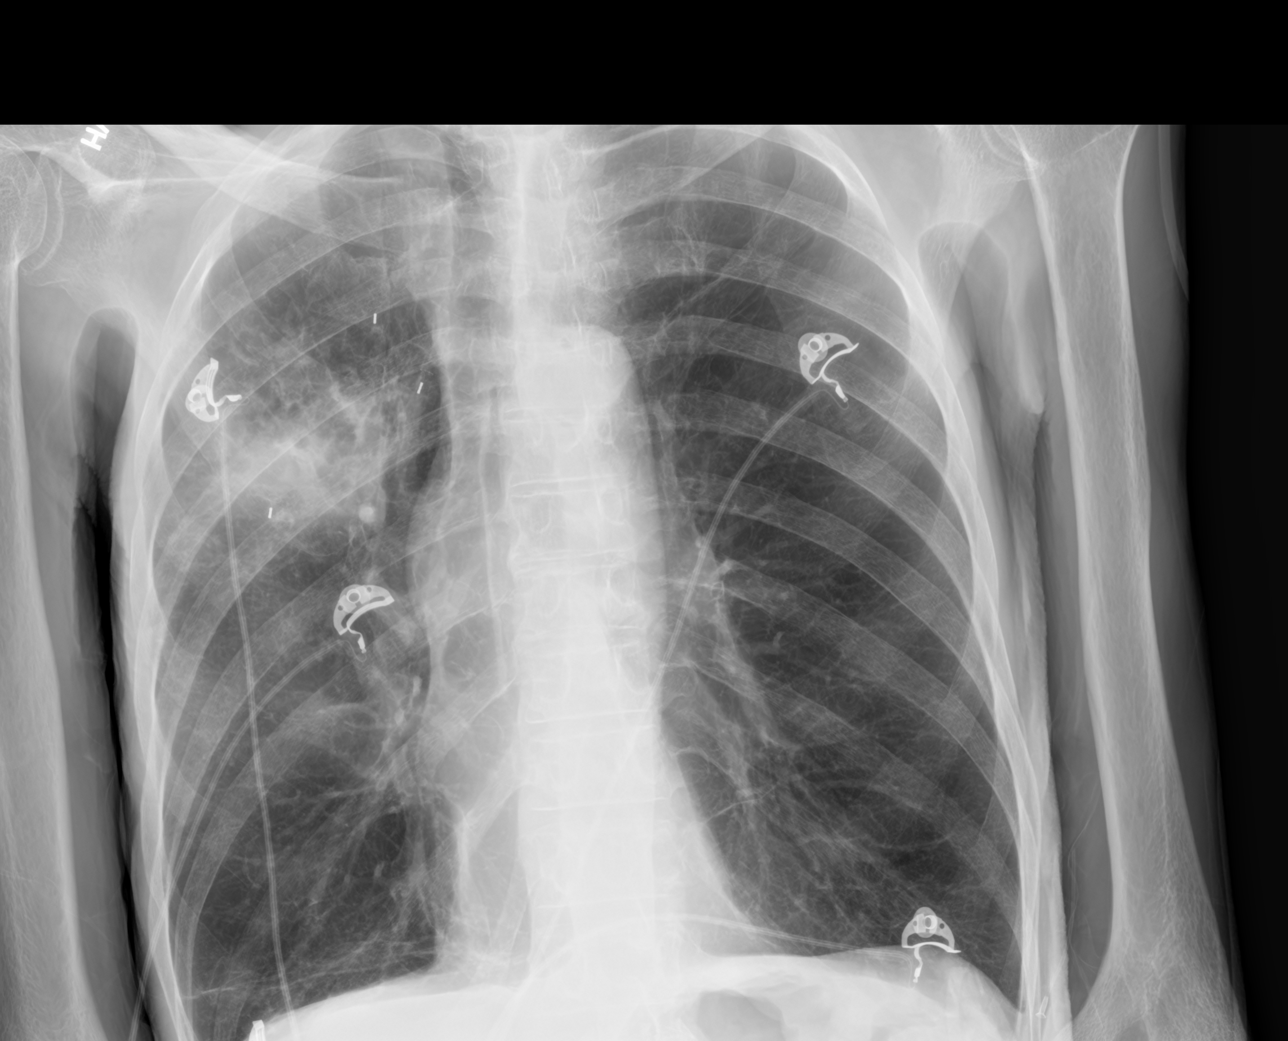

[3 of 3 positions shown; findings below may reference images not displayed]

FINDINGS: 6.3 cm focal consolidation in the posterior right upper lobe,
progressive since previous exam. Fiducial markers project anterior
to the process. Left lung remains clear. Heart size normal.
Persistent right middle lobe atelectasis/consolidation. No effusion.
No pneumothorax. Visualized skeletal structures are unremarkable.
IMPRESSION: 1. Progressive posterior right upper lobe  consolidation.

## 2016-09-15 IMAGING — CR DG CHEST 1V PORT
2 series · 2 of 2 positions shown · non-contrast
Comparison: 07/15/2015

CLINICAL DATA: 86-year-old male with shortness of breath

EXAM:
PORTABLE CHEST 1 VIEW

[AP (1 of 2)]
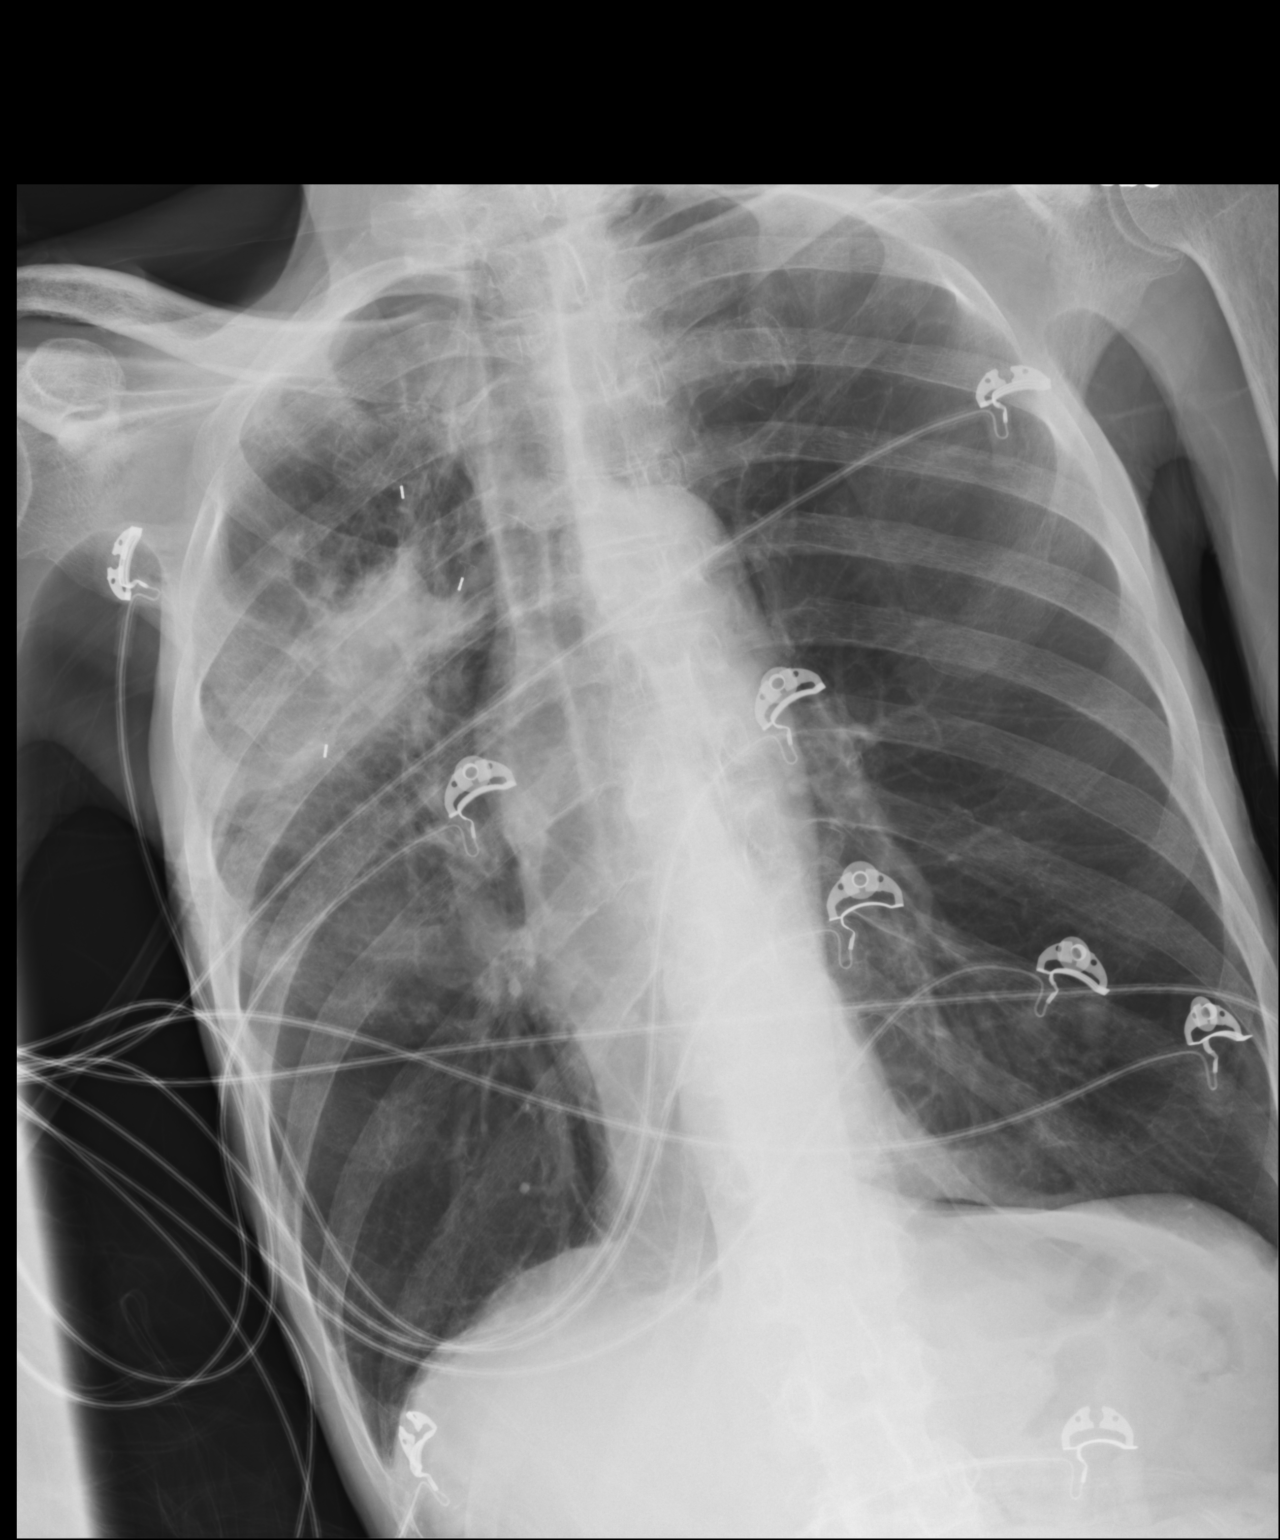

[AP (2 of 2)]
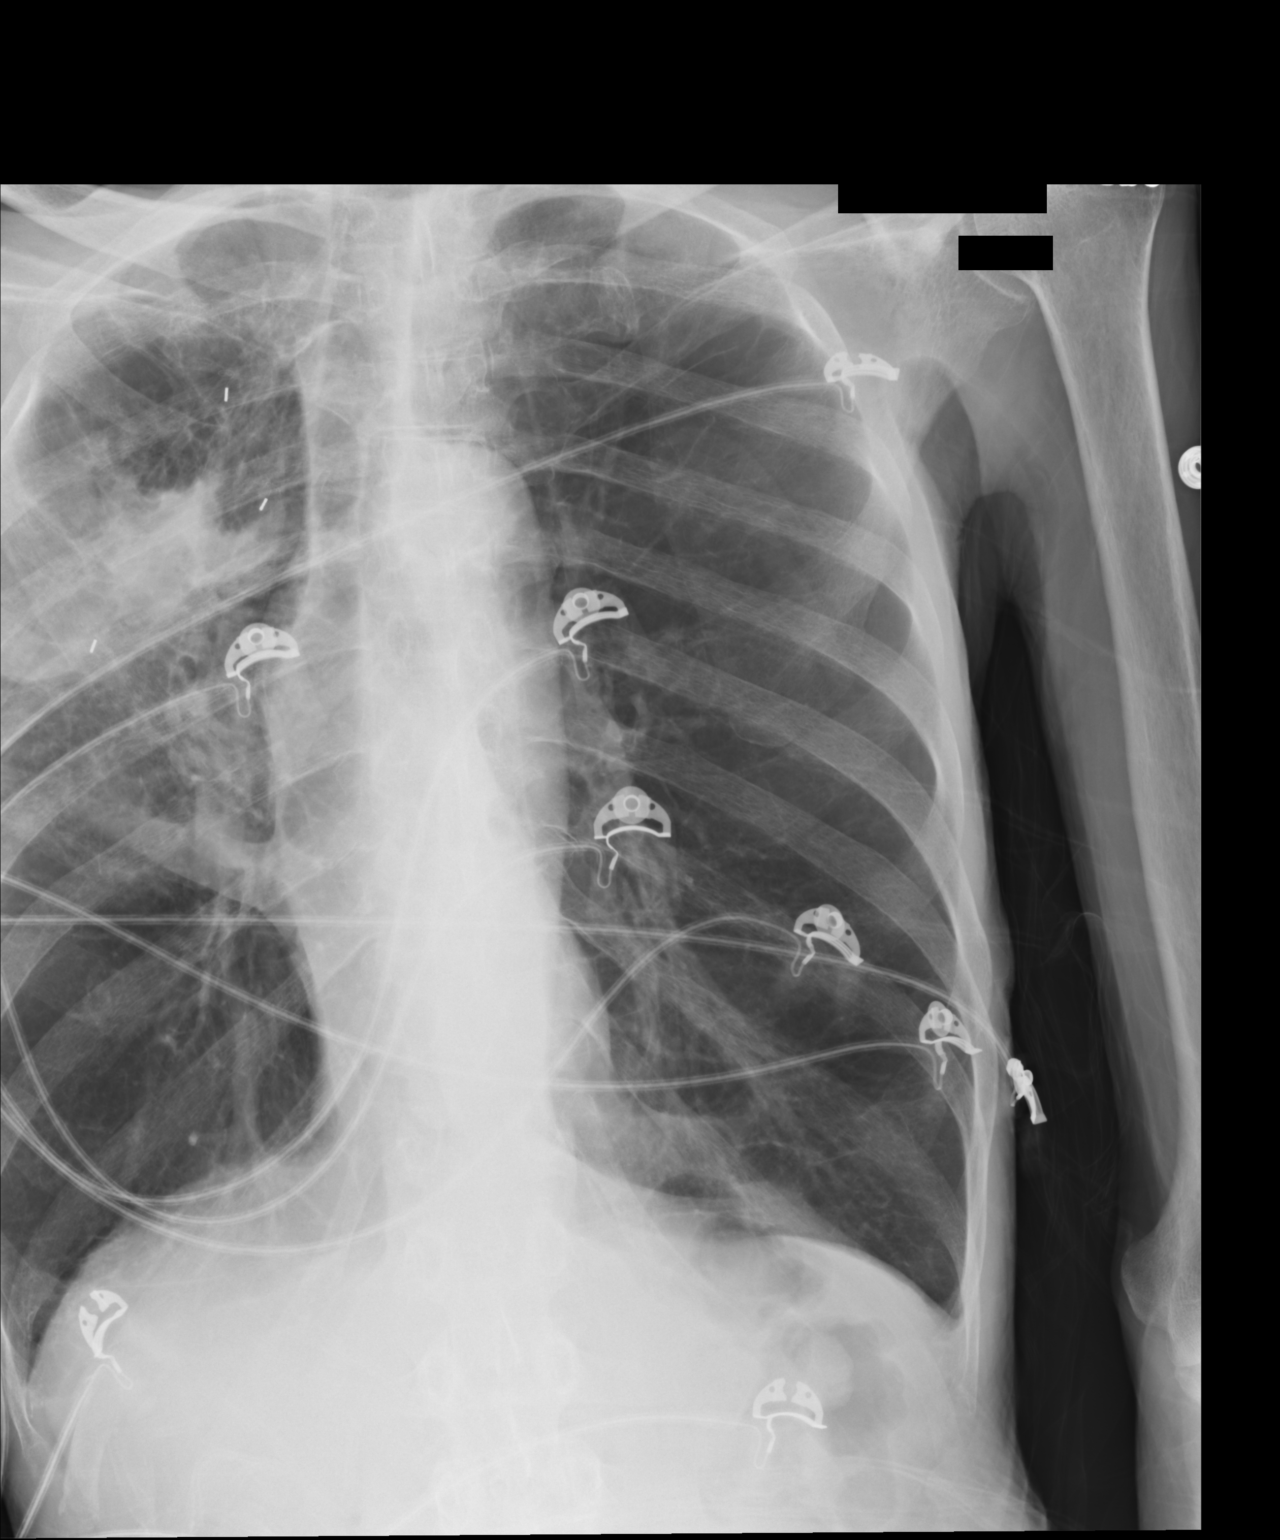

[2 of 2 positions shown; findings below may reference images not displayed]

FINDINGS: Single frontal view of the chest demonstrates emphysematous changes
of the lungs. An area of consolidation noted in the right upper lung
field measuring approximately 3.2 x 8.3 cm and appears larger in
size compared to prior study. Multiple surgical clips noted in the
adjacent inferior lung parenchyma. The left lung is clear. There is
no pneumothorax. The cardiac silhouette is within - normal limits.
The osseous structures are grossly unremarkable.
IMPRESSION: Interval progression of the right upper lung field opacity.
Follow-up resolution recommended.
